# Patient Record
Sex: Female | Born: 1970
Health system: Southern US, Community
[De-identification: ages and names within clinical notes are randomized; demographics above are authoritative.]

## PROBLEM LIST (undated history)

## (undated) DIAGNOSIS — R7303 Prediabetes: Secondary | ICD-10-CM

## (undated) DIAGNOSIS — F329 Major depressive disorder, single episode, unspecified: Secondary | ICD-10-CM

## (undated) DIAGNOSIS — K5900 Constipation, unspecified: Secondary | ICD-10-CM

## (undated) DIAGNOSIS — R519 Headache, unspecified: Secondary | ICD-10-CM

## (undated) DIAGNOSIS — M549 Dorsalgia, unspecified: Secondary | ICD-10-CM

## (undated) DIAGNOSIS — F419 Anxiety disorder, unspecified: Secondary | ICD-10-CM

## (undated) DIAGNOSIS — R12 Heartburn: Secondary | ICD-10-CM

## (undated) DIAGNOSIS — M25471 Effusion, right ankle: Secondary | ICD-10-CM

## (undated) DIAGNOSIS — N159 Renal tubulo-interstitial disease, unspecified: Secondary | ICD-10-CM

## (undated) DIAGNOSIS — E559 Vitamin D deficiency, unspecified: Secondary | ICD-10-CM

## (undated) DIAGNOSIS — M2550 Pain in unspecified joint: Secondary | ICD-10-CM

## (undated) DIAGNOSIS — R0602 Shortness of breath: Secondary | ICD-10-CM

## (undated) DIAGNOSIS — T7840XA Allergy, unspecified, initial encounter: Secondary | ICD-10-CM

## (undated) DIAGNOSIS — M1990 Unspecified osteoarthritis, unspecified site: Secondary | ICD-10-CM

## (undated) DIAGNOSIS — R51 Headache: Secondary | ICD-10-CM

## (undated) DIAGNOSIS — R350 Frequency of micturition: Secondary | ICD-10-CM

## (undated) DIAGNOSIS — F32A Depression, unspecified: Secondary | ICD-10-CM

## (undated) DIAGNOSIS — C50411 Malignant neoplasm of upper-outer quadrant of right female breast: Secondary | ICD-10-CM

## (undated) DIAGNOSIS — M25472 Effusion, left ankle: Secondary | ICD-10-CM

## (undated) HISTORY — DX: Allergy, unspecified, initial encounter: T78.40XA

## (undated) HISTORY — DX: Renal tubulo-interstitial disease, unspecified: N15.9

## (undated) HISTORY — PX: OTHER SURGICAL HISTORY: SHX169

## (undated) HISTORY — DX: Depression, unspecified: F32.A

## (undated) HISTORY — PX: DENTAL SURGERY: SHX609

## (undated) HISTORY — DX: Dorsalgia, unspecified: M54.9

## (undated) HISTORY — DX: Pain in unspecified joint: M25.50

## (undated) HISTORY — DX: Vitamin D deficiency, unspecified: E55.9

## (undated) HISTORY — DX: Malignant neoplasm of upper-outer quadrant of right female breast: C50.411

## (undated) HISTORY — DX: Constipation, unspecified: K59.00

## (undated) HISTORY — DX: Heartburn: R12

## (undated) HISTORY — DX: Anxiety disorder, unspecified: F41.9

## (undated) HISTORY — DX: Effusion, left ankle: M25.472

## (undated) HISTORY — DX: Shortness of breath: R06.02

## (undated) HISTORY — DX: Effusion, right ankle: M25.471

## (undated) HISTORY — PX: DILATION AND CURETTAGE OF UTERUS: SHX78

## (undated) HISTORY — DX: Prediabetes: R73.03

## (undated) HISTORY — PX: BREAST LUMPECTOMY: SHX2

---
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
## 2016-09-06 ENCOUNTER — Other Ambulatory Visit: Payer: Self-pay | Admitting: Radiology

## 2016-09-10 ENCOUNTER — Encounter: Payer: Self-pay | Admitting: *Deleted

## 2016-09-10 ENCOUNTER — Telehealth: Payer: Self-pay | Admitting: *Deleted

## 2016-09-10 DIAGNOSIS — C50411 Malignant neoplasm of upper-outer quadrant of right female breast: Secondary | ICD-10-CM

## 2016-09-10 DIAGNOSIS — Z171 Estrogen receptor negative status [ER-]: Secondary | ICD-10-CM

## 2016-09-10 DIAGNOSIS — C50412 Malignant neoplasm of upper-outer quadrant of left female breast: Secondary | ICD-10-CM | POA: Insufficient documentation

## 2016-09-10 HISTORY — DX: Malignant neoplasm of upper-outer quadrant of left female breast: C50.412

## 2016-09-10 HISTORY — DX: Estrogen receptor negative status (ER-): Z17.1

## 2016-09-10 NOTE — Telephone Encounter (Signed)
Confirmed [facility] for 09/15/16 at 1215 .  Instructions and contact information given.

## 2016-09-14 NOTE — Progress Notes (Signed)
[place]  [phone] Fax:([phone]) [place] Note   Patient Care Team: Aloha Gell, MD as PCP - General (Obstetrics and Gynecology) Kyung Rudd, MD as Consulting Physician (Radiation Oncology) Truitt Merle, MD as Consulting Physician (Hematology) Rolm Bookbinder, MD as Consulting Physician (General Surgery) 09/15/2016  CHIEF COMPLAINTS/PURPOSE OF CONSULTATION:  Newly diagnosed left breast cancer  Oncology History   Malignant neoplasm of upper-outer quadrant of right female breast [facility])   Staging form: Breast, AJCC 7th Edition   - Clinical stage from 09/06/2016: Stage IIA (T1b, N1, M0) - Signed by Truitt Merle, MD on 09/15/2016      Breast cancer of upper-outer quadrant of left female breast ([place])   09/06/2016 Initial Diagnosis    Malignant neoplasm of upper-outer quadrant of right female breast ([place])      09/06/2016 Initial Biopsy    Left breast 3:00 position showed invasive ductal carcinoma and DCIS, left axillary lymph node biopsy showed metastatic carcinoma with extracapsular extension      09/06/2016 Receptors her2    Breast tumor ER 100% positive, PR 100% positive, HER-2 negative, Ki-67 5%      09/06/2016 Mammogram    Diagnostic mammogram and ultrasound showed a 1.0 cm mass at the 3 clock position of left breast, and the enlarged left axillary node with cortical thickening. The 27m cm oval cyst in the right breast is likely benign.       HISTORY OF PRESENTING ILLNESS:  SSonakshi Rolland45y.o. female is here because of her recently diagnosed left breast cancer. She presents to our multidisciplinary breast clinic with her husband.    She had screening mammogram on 09/01/2016 at her gynecologist office, which showed a bilateral breast nodules. Her previous mammogram was 4 years ago which was normal. She denies any significant symptoms, except occasional mild left breast shooting pain for the past few months, no palpable mass,  skin change or nipple discharge. She feels well overall. She gained about 20lbs in the past 6 months   [facility] which showed a benign 867mcyst in the right breast, and a 1 cm mass in the 3:00 position of left breast, and I enlarged left axillary node. Both the left breast mass and axillary node biopsy showed invasive ductal carcinoma, ER/PR strongly positive, HER-2 negative.  GYN HISTORY  Menarchal: 12 LMP: spotting 08/2016, she has IUD (L[place]acie AcresContraceptive:  HRT: n/a G4P1: she has 1938o son    MEDICAL HISTORY:  Past Medical History:  Diagnosis Date  . Malignant ne[place] of [place]outer quadrant of right [place]    SURGICAL HISTORY: Past Surgical History:  Procedure Laterality Date  . right knee surgery at age of 45     SOCIAL HISTORY: Social History   Social History  . Marital status: Unknown    Spouse name: N/A  . Number of children: N/A  . Years of education: N/A   Occupational History  . Not on file.   Social History Main Topics  . Smoking status: Former Smoker    Packs/day: 0.50    Years: 30.00    Quit date: 08/22/2016  . Smokeless tobacco: Never Used  . Alcohol use 0.6 oz/week    1 Glasses of wine per week     Comment: 6 drinks wine or beer a week   . Drug use: No  . Sexual activity: Not on file   Other Topics Concern  .  Not on file   Social History Narrative  . No narrative on file   She works as a Regulatory affairs officer, works from home   FAMILY HISTORY: Family History  Problem Relation Age of Onset  . Cancer Paternal Grandmother 76    colon cancer   . Cancer Cousin 40    breast cancer     ALLERGIES:  has No Known Allergies.  MEDICATIONS:  No current outpatient prescriptions on file.   No current facility-administered medications for this visit.     REVIEW OF SYSTEMS:   Constitutional: Denies fevers, chills or abnormal night sweats Eyes: Denies blurriness  of vision, double vision or watery eyes Ears, nose, mouth, throat, and face: Denies mucositis or sore throat Respiratory: Denies cough, dyspnea or wheezes Cardiovascular: Denies palpitation, chest discomfort or lower extremity swelling Gastrointestinal:  Denies nausea, heartburn or change in bowel habits Skin: Denies abnormal skin rashes Lymphatics: Denies new lymphadenopathy or easy bruising Neurological:Denies numbness, tingling or new weaknesses Behavioral/Psych: Mood is stable, no new changes  All other systems were reviewed with the patient and are negative.  PHYSICAL EXAMINATION: ECOG PERFORMANCE STATUS: 0 - Asymptomatic  Vitals:   09/15/16 1310  BP: 107/62  Pulse: 79  Resp: 18  Temp: 98 F (36.7 C)   Filed Weights   09/15/16 1310  Weight: 163 lb 8 oz (74.2 kg)    GENERAL:alert, no distress and comfortable SKIN: skin color, texture, turgor are normal, no rashes or significant lesions EYES: normal, conjunctiva are pink and non-injected, sclera clear OROPHARYNX:no exudate, no erythema and lips, buccal mucosa, and tongue normal  NECK: supple, thyroid normal size, non-tender, without nodularity LYMPH:  no palpable lymphadenopathy in the cervical, axillary or inguinal LUNGS: clear to auscultation and percussion with normal breathing effort HEART: regular rate & rhythm and no murmurs and no lower extremity edema ABDOMEN:abdomen soft, non-tender and normal bowel sounds Musculoskeletal:no cyanosis of digits and no clubbing  PSYCH: alert & oriented x 3 with fluent speech NEURO: no focal motor/sensory deficits Breasts: Breast inspection showed them to be symmetrical with no nipple discharge. Mild skin bruise in the upper outer quadrant of left breast. Palpation of the breasts and axilla revealed no obvious mass that I could appreciate except lumpy breast in the upper outer quadrant of left breast.   LABORATORY DATA:  I have reviewed the data as listed CBC Latest Ref Rng &  Units 09/15/2016  WBC 3.9 - 10.3 10e3/uL 14.7(H)  Hemoglobin 11.6 - 15.9 g/dL 13.8  Hematocrit 34.8 - 46.6 % 42.5  Platelets 145 - 400 10e3/uL 448(H)   CMP Latest Ref Rng & Units 09/15/2016  Glucose 70 - 140 mg/dl 126  BUN 7.0 - 26.0 mg/dL 14.9  Creatinine 0.6 - 1.1 mg/dL 0.8  Sodium 136 - 145 mEq/L 140  Potassium 3.5 - 5.1 mEq/L 4.2  CO2 22 - 29 mEq/L 26  Calcium 8.4 - 10.4 mg/dL 9.1  Total Protein 6.4 - 8.3 g/dL 7.3  Total Bilirubin 0.20 - 1.20 mg/dL 0.26  Alkaline Phos 40 - 150 U/L 122  AST 5 - 34 U/L 22  ALT 0 - 55 U/L 37     PATHOLOGY REPORT  Diagnosis 09/06/2016 1. Breast, left, needle core biopsy, 3 o'clock - INVASIVE DUCTAL CARCINOMA. - DUCTAL CARCINOMA IN SITU.  - SEE COMMENT. 2. Lymph node, axilla and upper limb, left - METASTATIC CARCINOMA IN 1 OF 1 LYMPH NODE (1/1), WITH EXTRACAPSULAR EXTENSION. Microscopic Comment 1. The carcinoma appears grade 2. A breast prognostic  profile will be performed and the results reported separately. The results were called to [facility] on 09/07/16. (JBK:gt, 09/07/16) 1. PROGNOSTIC INDICATORS Results: IMMUNOHISTOCHEMICAL AND MORPHOMETRIC ANALYSIS PERFORMED MANUALLY Estrogen Receptor: 100%, POSITIVE, STRONG STAINING INTENSITY Progesterone Receptor: 100%, POSITIVE, STRONG STAINING INTENSITY Proliferation Marker Ki67: 5%  Results: HER2 - NEGATIVE RATIO OF HER2/CEP17 SIGNALS 1.46 AVERAGE HER2 COPY NUMBER PER CELL 3.00   RADIOGRAPHIC STUDIES: I have personally reviewed the radiological images as listed and agreed with the findings in the report. No results found.  I have reviewed her outside mammogram and ultrasound results.  ASSESSMENT & PLAN:  45 year old pre-or perimenopausal woman, presented with screening discovered left breast mass and axilla adenopathy.  1. Breast cancer of upper-outer quadrant of left female breast, [phone] stage IIA, G2, ER+/PR+/HER2-, Ki67 5% ---We discussed her imaging  findings and the biopsy results in great details. -Giving the small primary breast tumor, she is likely a candidate for left breast lumpectomy. The option of sentinel lymph node biopsy versus as her lymph node dissection were discussed by breast surgeon Dr. Donne Hazel today.  -She has biopsy confirmed axillary lymph node metastasis, however the primary breast showed low Ki-67, possible indolent disease. -I have requested HER2 and Ki-67 to be done on her lymph node biopsy sample. -I recommend a mammoprint test on the biopsy sample to see if she has high-risk vs low risk disease. If she does have high risk disease, I would offer neoadjuvant or adjuvant chemotherapy. If she receives neoadjuvant chemotherapy and has good response, she can potentially avoid axillary lymph node dissection. Dr. Donne Hazel prefers her to have neoadjuvant chemotherapy if she has high-risk disease. Patient is agreeable. -Giving the strong ER and PR expression in her tumor and her pre or perimenopausal status, I recommend adjuvant endocrine therapy with tamoxifen for 10 years to reduce the risk of cancer recurrence. Potential benefits and side effects were discussed with patient and she is interested. The potential side effects, which includes but not limited to, hot flash, skin and vaginal dryness, slightly increased risk of cardiovascular disease and cataract, small risk of thrombosis and endometrial cancer, were discussed with her in great details. Preventive strategies for thrombosis, such as being physically active, using compression stocks, avoid cigarette smoking, etc., were reviewed with her. I also recommend her to follow-up with her gynecologist once a year, and watch for vaginal spotting or bleeding, as a clinically sign of endometrial cancer, etc. She voiced good understanding, and agrees to proceed.  -Since she is going to wait for the mammoprint result, I recommend her to start Tamoxifen now, she agrees, I will call in to her  pharmacy today.  -I recommend her to remove her progesterone containing IUD, she will call her gynecologist.  2. Genetics  -Giving her young age and positive family history of breast cancer, she'll be referred to genetic counselor for genetic testing, to ruled out inheritable breast cancer syndrome.  Plan -Mammaprint on her breast or node biopsy sample  -Dr. Lyndon Code will order HER2 and Ki67 on her node biopsy sample  -I will see her back in 3 weeks to discuss her mammaprint test results. -She will start tamoxifen in a few days. I called into her pharmacy today. -genetic referral    All questions were answered. The patient knows to call the clinic with any problems, questions or concerns. I spent 55 minutes counseling the patient face to face. The total time spent in the appointment was 60 minutes and more than 50%  was on counseling.     Truitt Merle, MD 09/15/2016

## 2016-09-15 ENCOUNTER — Ambulatory Visit (HOSPITAL_BASED_OUTPATIENT_CLINIC_OR_DEPARTMENT_OTHER): Payer: Managed Care, Other (non HMO) | Admitting: Hematology

## 2016-09-15 ENCOUNTER — Encounter: Payer: Self-pay | Admitting: Hematology

## 2016-09-15 ENCOUNTER — Ambulatory Visit: Payer: Managed Care, Other (non HMO) | Attending: General Surgery | Admitting: Physical Therapy

## 2016-09-15 ENCOUNTER — Other Ambulatory Visit (HOSPITAL_BASED_OUTPATIENT_CLINIC_OR_DEPARTMENT_OTHER): Payer: Managed Care, Other (non HMO)

## 2016-09-15 ENCOUNTER — Encounter: Payer: Self-pay | Admitting: Physical Therapy

## 2016-09-15 ENCOUNTER — Ambulatory Visit
Admission: RE | Admit: 2016-09-15 | Discharge: 2016-09-15 | Disposition: A | Discharge status: Home / Self Care | Payer: Managed Care, Other (non HMO) | Source: Ambulatory Visit | Attending: Radiation Oncology | Admitting: Radiation Oncology

## 2016-09-15 VITALS — BP 107/62 | HR 79 | Temp 98.0°F | Resp 18 | Ht 64.0 in | Wt 163.5 lb

## 2016-09-15 DIAGNOSIS — C773 Secondary and unspecified malignant neoplasm of axilla and upper limb lymph nodes: Secondary | ICD-10-CM | POA: Diagnosis not present

## 2016-09-15 DIAGNOSIS — Z170 Estrogen receptor positive status [ER+]: Secondary | ICD-10-CM | POA: Insufficient documentation

## 2016-09-15 DIAGNOSIS — C50412 Malignant neoplasm of upper-outer quadrant of left female breast: Secondary | ICD-10-CM | POA: Diagnosis not present

## 2016-09-15 DIAGNOSIS — Z510 Encounter for antineoplastic radiation therapy: Secondary | ICD-10-CM | POA: Insufficient documentation

## 2016-09-15 DIAGNOSIS — C50411 Malignant neoplasm of upper-outer quadrant of right female breast: Secondary | ICD-10-CM

## 2016-09-15 DIAGNOSIS — R293 Abnormal posture: Secondary | ICD-10-CM | POA: Insufficient documentation

## 2016-09-15 DIAGNOSIS — Z803 Family history of malignant neoplasm of breast: Secondary | ICD-10-CM | POA: Diagnosis not present

## 2016-09-15 LAB — CBC WITH DIFFERENTIAL/PLATELET
BASO%: 1.3 % (ref 0.0–2.0)
Basophils Absolute: 0.2 10*3/uL — ABNORMAL HIGH (ref 0.0–0.1)
EOS ABS: 0.1 10*3/uL (ref 0.0–0.5)
EOS%: 0.4 % (ref 0.0–7.0)
HCT: 42.5 % (ref 34.8–46.6)
HEMOGLOBIN: 13.8 g/dL (ref 11.6–15.9)
LYMPH#: 2.6 10*3/uL (ref 0.9–3.3)
LYMPH%: 17.7 % (ref 14.0–49.7)
MCH: 29.1 pg (ref 25.1–34.0)
MCHC: 32.5 g/dL (ref 31.5–36.0)
MCV: 89.5 fL (ref 79.5–101.0)
MONO#: 1 10*3/uL — AB (ref 0.1–0.9)
MONO%: 6.5 % (ref 0.0–14.0)
NEUT%: 74.1 % (ref 38.4–76.8)
NEUTROS ABS: 10.9 10*3/uL — AB (ref 1.5–6.5)
PLATELETS: 448 10*3/uL — AB (ref 145–400)
RBC: 4.75 10*6/uL (ref 3.70–5.45)
RDW: 14.3 % (ref 11.2–14.5)
WBC: 14.7 10*3/uL — AB (ref 3.9–10.3)

## 2016-09-15 LAB — COMPREHENSIVE METABOLIC PANEL
ALBUMIN: 3.7 g/dL (ref 3.5–5.0)
ALK PHOS: 122 U/L (ref 40–150)
ALT: 37 U/L (ref 0–55)
ANION GAP: 8 meq/L (ref 3–11)
AST: 22 U/L (ref 5–34)
BILIRUBIN TOTAL: 0.26 mg/dL (ref 0.20–1.20)
BUN: 14.9 mg/dL (ref 7.0–26.0)
CO2: 26 mEq/L (ref 22–29)
CREATININE: 0.8 mg/dL (ref 0.6–1.1)
Calcium: 9.1 mg/dL (ref 8.4–10.4)
Chloride: 106 mEq/L (ref 98–109)
EGFR: 87 mL/min/{1.73_m2} — AB (ref >90)
GLUCOSE: 126 mg/dL (ref 70–140)
Potassium: 4.2 mEq/L (ref 3.5–5.1)
Sodium: 140 mEq/L (ref 136–145)
TOTAL PROTEIN: 7.3 g/dL (ref 6.4–8.3)

## 2016-09-15 MED ORDER — TAMOXIFEN CITRATE 20 MG PO TABS: 20.0000 mg | ORAL_TABLET | Freq: Every day | ORAL | 1 refills | Status: DC

## 2016-09-15 NOTE — Therapy (Signed)
[place], [place], [address] Phone: [phone]   Fax:  [phone]  Physical Therapy Evaluation  Patient Details  Name: Gloria Smith MRN: [id number] Date of Birth: [birth year] Referring Provider: Dr. Rolm Bookbinder  Encounter Date: 09/15/2016      PT End of Session - 09/15/16 2138    Visit Number 1   Number of Visits 1   PT Start Time 6967   PT Stop Time 1525   PT Time Calculation (min) 31 min   Activity Tolerance Patient tolerated treatment well   Behavior During Therapy [facility] for tasks assessed/performed      Past Medical History:  Diagnosis Date  . Malignant neoplasm of upper-outer quadrant of right female breast ([place]) 09/10/2016    Past Surgical History:  Procedure Laterality Date  . right knee surgery at age of 77       There were no vitals filed for this visit.       Subjective Assessment - 09/15/16 2138    Subjective Patient reports she is here today to be seen by her medical team for her newly diagnosed left breast cancer.   Patient is accompained by: Family member   Pertinent History Patient was diagnosed on 09/01/16 with left invasive breast cancer.  It is ER/PR positive, HER2 negative, and has a Ki67 of 5%. It measures 1 cm and is located in the upper outer quadrant. She has a biopsied positive axillary lymph node.   Patient Stated Goals Reduce lymphedema risk and learn post op shoulder ROM HEP            [facility] PT Assessment - 09/15/16 0001      Assessment   Medical Diagnosis Left breast cancer   Referring Provider Dr. Rolm Bookbinder   Onset Date/Surgical Date 09/01/16   Hand Dominance Right   Prior Therapy none     Precautions   Precautions Other (comment)   Precaution Comments Active cancer     Restrictions   Weight Bearing Restrictions No     Balance Screen   Has the patient fallen in the past 6 months No   Has the patient had a decrease in activity  level because of a fear of falling?  No   Is the patient reluctant to leave their home because of a fear of falling?  No     Home Ecologist residence   Living Arrangements Spouse/significant other  Lives with her boyfriend   Available Help at Discharge Family     Prior Function   Level of Independence Independent   Vocation Full time employment   Vocation Requirements Desk work   Leisure She does not exercise     Cognition   Overall Cognitive Status Within Functional Limits for tasks assessed     Posture/Postural Control   Posture/Postural Control Postural limitations   Postural Limitations Rounded Shoulders;Forward head     ROM / Strength   AROM / PROM / Strength AROM;Strength     AROM   AROM Assessment Site Shoulder;Cervical   Right/Left Shoulder Right;Left   Right Shoulder Extension 45 Degrees   Right Shoulder Flexion 145 Degrees   Right Shoulder ABduction 169 Degrees   Right Shoulder Internal Rotation 78 Degrees   Right Shoulder External Rotation 88 Degrees   Left Shoulder Extension 54 Degrees   Left Shoulder Flexion 153 Degrees   Left Shoulder ABduction 168 Degrees   Left Shoulder Internal Rotation 78 Degrees  Left Shoulder External Rotation 80 Degrees   Cervical Flexion WNL   Cervical Extension WNL   Cervical - Right Side Bend WNL   Cervical - Left Side Bend WNL   Cervical - Right Rotation WNL   Cervical - Left Rotation WNL     Strength   Overall Strength Within functional limits for tasks performed           LYMPHEDEMA/ONCOLOGY QUESTIONNAIRE - 09/15/16 2133      Type   Cancer Type Left breast cancer     Lymphedema Assessments   Lymphedema Assessments Upper extremities     Right Upper Extremity Lymphedema   10 cm Proximal to Olecranon Process 28.8 cm   Olecranon Process 24.3 cm   10 cm Proximal to Ulnar Styloid Process 24 cm   Just Proximal to Ulnar Styloid Process 16.5 cm   Across Hand at [employer] 18.6 cm    At [place] of 2nd Digit 6.9 cm     Left Upper Extremity Lymphedema   10 cm Proximal to Olecranon Process 29.8 cm   Olecranon Process 24 cm   10 cm Proximal to Ulnar Styloid Process 23.2 cm   Just Proximal to Ulnar Styloid Process 16.1 cm   Across Hand at [employer] 18.3 cm   At [place] of 2nd Digit 6.7 cm      Patient was instructed today in a home exercise program today for post op shoulder range of motion. These included active assist shoulder flexion in sitting, scapular retraction, wall walking with shoulder abduction, and hands behind head external rotation.  She was encouraged to do these twice a day, holding 3 seconds and repeating 5 times when permitted by her physician.         PT Education - 09/15/16 2137    Education provided Yes   Education Details Post op shoulder ROM HEP and lymphedema risk reduction   Person(s) Educated Patient   Methods Explanation;Demonstration;Handout   Comprehension Returned demonstration;Verbalized understanding              Breast Clinic Goals - 09/15/16 2143      Patient will be able to verbalize understanding of pertinent lymphedema risk reduction practices relevant to her diagnosis specifically related to skin care.   Time 1   Period Days   Status Achieved     Patient will be able to return demonstrate and/or verbalize understanding of the post-op home exercise program related to regaining shoulder range of motion.   Time 1   Period Days   Status Achieved     Patient will be able to verbalize understanding of the importance of attending the postoperative After Breast Cancer Class for further lymphedema risk reduction education and therapeutic exercise.   Time 1   Period Days   Status Achieved              Plan - 09/15/16 2138    Clinical Impression Statement Patient was diagnosed on 09/01/16 with left invasive breast cancer.  It is ER/PR positive, HER2 negative, and has a Ki67 of 5%. It measures 1 cm and is located  in the upper outer quadrant. She has a biopsied positive axillary lymph node. Her multidisciplinary medical team met prior to her assessments to determine a recommended treatment plan. She is planning to have Mammaprint testing on her core biopsy.  If those results indicate a need for chemotherapy, she will consider neoadjuvant chemotherapy. She began Tamoxifen today.  After chemo (if she needs chemo),  she will likely undergo a left lumpectomy and targeted axillary node dissection followed by radiation. She may benefit from post op PT to regain shoulder ROM and reduce lymphedema risk.  Due to her lack of comorbidities, her eval is of low complexity.   Rehab Potential Excellent   Clinical Impairments Affecting Rehab Potential None   PT Frequency One time visit   PT Treatment/Interventions Patient/family education;Therapeutic exercise   PT Next Visit Plan Will f/u after surgery to determine PT needs   PT Home Exercise Plan Post op shoulder ROM HEP and lymphedema risk reduction   Consulted and Agree with Plan of Care Patient;Family member/caregiver   Family Member Consulted Boyfriend      Patient will benefit from skilled therapeutic intervention in order to improve the following deficits and impairments:  Postural dysfunction, Decreased knowledge of precautions, Pain, Impaired UE functional use, Decreased range of motion  Visit Diagnosis: Carcinoma of upper-outer quadrant of left breast in female, estrogen receptor positive ([place]) - Plan: PT plan of care cert/re-cert  Abnormal posture - Plan: PT plan of care cert/re-cert   Patient will follow up at outpatient cancer rehab if needed following surgery.  If the patient requires physical therapy at that time, a specific plan will be dictated and sent to the referring physician for approval. The patient was educated today on appropriate basic range of motion exercises to begin post operatively and the importance of attending the After Breast Cancer  class following surgery.  Patient was educated today on lymphedema risk reduction practices as it pertains to recommendations that will benefit the patient immediately following surgery.  She verbalized good understanding.  No additional physical therapy is indicated at this time.     Problem List Patient Active Problem List   Diagnosis Date Noted  . Breast cancer of upper-outer quadrant of left female breast ([place]) 09/10/2016   Annia Friendly, PT 09/15/16 9:45 PM   [place], [place], [address] Phone: [phone]   Fax:  [phone]  Name: Gloria Smith MRN: [id number] Date of Birth: December 12, 1970

## 2016-09-15 NOTE — Patient Instructions (Signed)

## 2016-09-15 NOTE — Progress Notes (Signed)
ON[facility]N DISTRESS SCREENING 09/15/2016  Screening Type Initial Screening  Distress experienced in past week (1-10) 6  Practical problem type Insurance;Work/school  Family Problem type Partner;Children;Other (comment)  Emotional problem type Nervousness/Anxiety;Adjusting to illness  Information Concerns Type Lack of info about diagnosis;Lack of info about treatment  Physical Problem type Pain;Sleep/insomnia;Constipation/diarrhea;Tingling hands/feet  Referral to support programs Yes    Met with patient in Breast Multidisciplinary Clinic to introduce [place] team/resources, reviewing distress screen per protocol.  The patient scored a 6 on the Psychosocial Distress Thermometer which indicates moderate distress. Also assessed for distress and other psychosocial needs.  Patient shared how helpful it was to receive more information regarding her diagnosis and treatment plan. Pt said that she takes comfort in having a plan and that her anxiety had gone down throughout clinic. Patient's family lives in [place] but shared that she has a very supportive community, especially at work. Patient's boyfriend agreed that having more information has eased his worry. Patient appeared interested in the support center services and said she would keep them in mind as treatment progressed.  Rosana Fret, Counseling Intern  Supervisor, Lorrin Jackson, Chaplain

## 2016-09-15 NOTE — Progress Notes (Signed)
Radiation Oncology         [phone] ________________________________  Name: Gloria Smith MRN: [id number]  Date: 09/15/2016  DOB: 1971-03-15  PI:RJJOACZY,SAYTK A., MD  Aloha Gell, MD     REFERRING PHYSICIAN: Aloha Gell, MD   DIAGNOSIS: The encounter diagnosis was Malignant neoplasm of upper-outer quadrant of right female breast, unspecified estrogen receptor status ([place]).   HISTORY OF PRESENT ILLNESS: Gloria Smith is a 45 y.o. female seen in the multidisciplinary breast clinic for a new diagnosis of right breast cancer. She was seen for a screening mammogram which revealed a cystic change in the right breast and a 1 cm mass in the left breast. Diagnostic imaging including ultrasound confirmed a 1 cm mass in the left breast and one area of adenopathy in the left axilla. She underwent biopsy on 09/06/16 which revealed a grade 2 invasive ductal carcinoma. The left axillary node seen on ultrasound was also biopsied and confirmed invasive ductal carcinoma with extracapsular extension. Hormone receptors were notable for ER/PR positivity, and HER2 negative, Ki67 was 5% on the breast specimen. The lymph node did not have hormone receptors tested separately and will be requested. She comes to discuss the options for treatment of her cancer.    PREVIOUS RADIATION THERAPY: No   PAST MEDICAL HISTORY:  Past Medical History:  Diagnosis Date  . Malignant neoplasm of upper-outer quadrant of right female breast ([place]) 09/10/2016       PAST SURGICAL HISTORY: Past Surgical History:  Procedure Laterality Date  . right knee surgery at age of 69        FAMILY HISTORY:  Family History  Problem Relation Age of Onset  . Cancer Paternal Grandmother 17    colon cancer   . Cancer Cousin 40    breast cancer      SOCIAL HISTORY:  reports that she quit smoking about 3 weeks ago. She has a 15.00 pack-year smoking history. She does not have any smokeless tobacco history on file. She  reports that she drinks about 0.6 oz of alcohol per week . She reports that she does not use drugs. The patient is in a relationship and is originally from [place]. 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   ALLERGIES: Review of patient's allergies indicates no known allergies.   MEDICATIONS:  No current outpatient prescriptions on file.   No current facility-administered medications for this encounter.      REVIEW OF SYSTEMS: On review of systems, the patient reports that she is doing well overall. She denies any chest pain, shortness of breath, cough, fevers, chills, night sweats, unintended weight changes. She denies any bowel or bladder disturbances, and denies abdominal pain, nausea or vomiting. She denies any new musculoskeletal or joint aches or pains. A complete review of systems is obtained and is otherwise negative.     PHYSICAL EXAM:  Wt Readings from Last 3 Encounters:  09/15/16 163 lb 8 oz (74.2 kg)   Temp Readings from Last 3 Encounters:  09/15/16 98 F (36.7 C) (Oral)   BP Readings from Last 3 Encounters:  09/15/16 107/62   Pulse Readings from Last 3 Encounters:  09/15/16 79     Pain scale 0/10 In general this is a well appearing caucasian female in no acute distress. She is alert and oriented x4 and appropriate throughout the examination. HEENT reveals that the patient is normocephalic, atraumatic. EOMs are intact. PERRLA. Skin is intact without any evidence of gross lesions. Cardiovascular exam reveals a regular  rate and rhythm, no clicks rubs or murmurs are auscultated. Chest is clear to auscultation bilaterally. Lymphatic assessment is performed and does not reveal any adenopathy in the cervical, supraclavicular, axillary, or inguinal chains. Bilateral breast exam is performed and reveals no distinct mass of the right breast. The left breast reveals post biopsy changes including induration beneath the biopsy site. There is also subtle fullness at this  site consistent with her tumor. Abdomen has active bowel sounds in all quadrants and is intact. The abdomen is soft, non tender, non distended. Lower extremities are negative for pretibial pitting edema, deep calf tenderness, cyanosis or clubbing.   ECOG = 0  0 - Asymptomatic (Fully active, able to carry on all predisease activities without restriction)  1 - Symptomatic but completely ambulatory (Restricted in physically strenuous activity but ambulatory and able to carry out work of a light or sedentary nature. For example, light housework, office work)  2 - Symptomatic, <50% in bed during the day (Ambulatory and capable of all self care but unable to carry out any work activities. Up and about more than 50% of waking hours)  3 - Symptomatic, >50% in bed, but not bedbound (Capable of only limited self-care, confined to bed or chair 50% or more of waking hours)  4 - Bedbound (Completely disabled. Cannot carry on any self-care. Totally confined to bed or chair)  5 - Death   Eustace Pen MM, Creech RH, Tormey DC, et al. [phone]). "Toxicity and response criteria of the [facility]". [place] Oncol. 5 (6): 649-55    LABORATORY DATA:  Lab Results  Component Value Date   WBC 14.7 (H) 09/15/2016   HGB 13.8 09/15/2016   HCT 42.5 09/15/2016   MCV 89.5 09/15/2016   PLT 448 (H) 09/15/2016   Lab Results  Component Value Date   NA 140 09/15/2016   K 4.2 09/15/2016   CO2 26 09/15/2016   Lab Results  Component Value Date   ALT 37 09/15/2016   AST 22 09/15/2016   ALKPHOS 122 09/15/2016   BILITOT 0.26 09/15/2016      RADIOGRAPHY: No results found.     IMPRESSION/PLAN: 1. Stage IIA, cT1bN1Mx, ER/PR positive invasive ductal carcinoma of the left breast. Dr. Lisbeth Renshaw discusses the pathology findings and reviews the nature of invasive breast disease. The consensus from the breast conference include submitting her nodal specimen for hormone receptor and Ki67 testing. Based on  this and mammaprint that will be ordered on the specimen, this will determine a role for neoadjuvant chemotherapy. Following this, breast conservation with lumpectomy with sentinel mapping. The patient's course would then be followed by external radiotherapy to the breast followed by antiestrogen therapy. We discussed the risks, benefits, short, and long term effects of radiotherapy, and the patient is interested in proceeding. Dr. Lisbeth Renshaw discusses the delivery and logistics of radiotherapy. We will see her back about 2-3 weeks after surgery to move forward with the simulation and planning process and anticipate starting radiotherapy about 4 weeks after surgery.  2. Possible genetic predisposition to malignancy. Given the patient's family and personal history, she is a candidate to meet and proceed with genetic testing. A referral for this will be placed through the clinic.    The above documentation reflects my direct findings during this shared patient visit. Please see the separate note by Dr. Lisbeth Renshaw on this date for the remainder of the patient's plan of care.    Carola Rhine, PAC

## 2016-09-16 ENCOUNTER — Telehealth: Payer: Self-pay | Admitting: *Deleted

## 2016-09-16 ENCOUNTER — Ambulatory Visit (HOSPITAL_BASED_OUTPATIENT_CLINIC_OR_DEPARTMENT_OTHER): Payer: Managed Care, Other (non HMO) | Admitting: Genetic Counselor

## 2016-09-16 ENCOUNTER — Other Ambulatory Visit: Payer: Managed Care, Other (non HMO)

## 2016-09-16 ENCOUNTER — Encounter: Payer: Self-pay | Admitting: Genetic Counselor

## 2016-09-16 DIAGNOSIS — Z803 Family history of malignant neoplasm of breast: Secondary | ICD-10-CM | POA: Diagnosis not present

## 2016-09-16 DIAGNOSIS — C50412 Malignant neoplasm of upper-outer quadrant of left female breast: Secondary | ICD-10-CM

## 2016-09-16 DIAGNOSIS — Z170 Estrogen receptor positive status [ER+]: Principal | ICD-10-CM

## 2016-09-16 DIAGNOSIS — Z800 Family history of malignant neoplasm of digestive organs: Secondary | ICD-10-CM

## 2016-09-16 DIAGNOSIS — Z315 Encounter for genetic counseling: Secondary | ICD-10-CM

## 2016-09-16 NOTE — Progress Notes (Signed)
REFERRING PROVIDER: Truitt Merle, MD  OTHER PROVIDER(S): WAKEFIELD, MATTHEW, MD Kyung Rudd, MD  PRIMARY PROVIDER:  Ala Dach., MD  PRIMARY REASON FOR VISIT:  1. Malignant neoplasm of upper-outer quadrant of left breast in female, estrogen receptor positive ([place])   2. Family history of breast cancer in female   3. Family history of colon cancer      HISTORY OF PRESENT ILLNESS:   Ms. Kuipers, a 45 y.o. female, was seen for a [facility] cancer genetics consultation at the request of Dr. Burr Medico due to a personal and family history of breast cancer.  Ms. Mcquilkin presents to clinic today to discuss the possibility of a hereditary predisposition to cancer, genetic testing, and to further clarify her future cancer risks, as well as potential cancer risks for family members.   In October 2017, at the age of 52, Ms. Cuffie was diagnosed with invasive ductal carcinoma with DCIS of the left breast.  Hormone receptor status was ER/PR+, Her2-. Genetic testing will help inform Ms. Granier's surgical/treatment decisions.  Ms. Heron reports no additional personal history of cancer.   CANCER HISTORY:  Oncology History   Malignant neoplasm of upper-outer quadrant of right female breast [facility])   Staging form: Breast, AJCC 7th Edition   - Clinical stage from 09/06/2016: Stage IIA (T1b, N1, M0) - Signed by Truitt Merle, MD on 09/15/2016      Breast cancer of upper-outer quadrant of left female breast ([place])   09/06/2016 Initial Diagnosis    Malignant neoplasm of upper-outer quadrant of right female breast ([place])      09/06/2016 Initial Biopsy    Left breast 3:00 position showed invasive ductal carcinoma and DCIS, left axillary lymph node biopsy showed metastatic carcinoma with extracapsular extension      09/06/2016 Receptors her2    Breast tumor ER 100% positive, PR 100% positive, HER-2 negative, Ki-67 5%      09/06/2016 Mammogram    Diagnostic mammogram and ultrasound showed a 1.0  cm mass at the 3 clock position of left breast, and the enlarged left axillary node with cortical thickening. The 82m cm oval cyst in the right breast is likely benign.        HORMONAL RISK FACTORS:  Menarche was at age [age]  First live birth at age 89  OCP use for approximately 10+ years (pills, hormonal IUD, depo provera).  Ovaries intact: yes.  Hysterectomy: no.  Menopausal status: perimenopausal.  HRT use: 0 years. Colonoscopy: no; not examined. Mammogram within the last year: prior to screening with this cancer, she had not had a mammogram since 2013. Number of breast biopsies: 1. Up to date with pelvic exams:  yes. Any excessive radiation exposure/other exposures in the past:  no  Past Medical History:  Diagnosis Date  . Malignant neoplasm of upper-outer quadrant of right female breast ([place] 09/10/2016    Past Surgical History:  Procedure Laterality Date  . right knee surgery at age of 89      Social History   Social History  . Marital status: Unknown    Spouse name: N/A  . Number of children: N/A  . Years of education: N/A   Social History Main Topics  . Smoking status: Former Smoker    Packs/day: 0.50    Years: 30.00    Quit date: 08/22/2016  . Smokeless tobacco: Never Used  . Alcohol use 0.6 oz/week    1 Glasses of wine per week     Comment: 6 drinks wine  or beer a week   . Drug use: No  . Sexual activity: Not on file   Other Topics Concern  . Not on file   Social History Narrative  . No narrative on file     FAMILY HISTORY:  We obtained a detailed, 4-generation family history.  Significant diagnoses are listed below: Family History  Problem Relation Age of Onset  . Colon cancer Paternal Grandmother 95    d. 4  . Breast cancer Cousin     paternal 1st cousin, once-removed; dx. 3s  . Other Mother     hx precancerous skin finding removed from eyelid at 70-71  . Heart attack Maternal Grandmother 70  . Stroke Maternal Grandfather   . Heart  attack Paternal Grandfather 75  . Other Other 25    hx of precancerous finding on abnormal pap smear    Ms. Trunnell has one son who is 43.  She has one full sister who is currently 50 and who has never had cancer.  Her sister has two daughters, the oldest of which has had a recent abnormal pap smear that demonstrated pre-cancerous cells.  Ms. Sarkisyan mother is currently 14 and has not had cancer.  She does report that she had a pre-cancerous cells removed from her eyelid within the last year.  Ms. Scotti father is currently 87 and has not had cancer.  Ms. Verne mother has two full brothers, currently in their mid-70s-80s and cancer-free.  Ms. Heathcock has two maternal first cousins, for whom she reports no history of cancer.  Her maternal grandmother died of a heart attack at 17.  Her grandfather had a history of strokes and died at 65.  She has no information for her maternal great aunts/uncles and great grandparents.  Ms. Oswald father has one full sister and two full brothers.  His sister died of alcohol abuse in her 12s.  One brother died "in his sleep", possibly from heart-related causes at age 41-62.  One brother is currently 31 and has not had cancer.  Ms. Railey reports no cancer history for her paternal first cousins.  Her paternal grandmother was diagnosed with colon cancer at 70 and passed away 6 months later.  This grandmother was one of 13 siblings, but Ms. Argueta has limited information for these great aunts/uncles.  She does report that one great uncle had a daughter (a paternal first cousins, one-removed) who was diagnosed with breast cancer in her 68s.  Ms. Depasquale paternal grandfather died of a heart attack at 33.  She has no information for his siblings or parents.    Ms. Carignan is unaware of any previous family history of genetic testing for hereditary cancer risks.  Patient's maternal and paternal ancestors are of English descent. There is no  reported Ashkenazi Jewish ancestry. There is no known consanguinity.  GENETIC COUNSELING ASSESSMENT: Haili Donofrio is a 44 y.o. female with a personal and distant family history of breast cancer which is somewhat suggestive of a hereditary breast cancer syndrome and predisposition to cancer. We, therefore, discussed and recommended the following at today's visit.   DISCUSSION: We reviewed the characteristics, features and inheritance patterns of hereditary cancer syndromes, particularly those caused by mutations within the BRCA1/2 genes. We also discussed genetic testing, including the appropriate family members to test, the process of testing, insurance coverage and turn-around-time for results. We discussed the implications of a negative, positive and/or variant of uncertain significant result. We discussed that Ms. Wegner just misses her insurance  criteria for genetic testing, as she was diagnosed at 1 and the only known relative with breast cancer in her family is a fourth-degree relatives.  Still yet, she was diagnosed with breast cancer at a young age, has a somewhat smaller immediate family and has little information for her great aunts and great uncles.  We discussed self-pay testing versus insurance coverage.  Ms. Jobst is very interested in genetic testing, as it will help her make surgical decisions, so she would like to proceed with testing and she would like to pursue insurance coverage.   Thus, we recommended Ms. Sipe pursue genetic testing for the 20-gene Breast/Ovarian Cancer Panel through [facility].  The Breast/Ovarian Cancer Panel offered by [facility] Hope Pigeon, MD) includes sequencing and deletion/duplication analysis for the following 19 genes:  ATM, BARD1, BRCA1, BRCA2, BRIP1, CDH1, CHEK2, FANCC, MLH1, MSH2, MSH6, NBN, PALB2, PMS2, PTEN, RAD51C, RAD51D, TP53, and XRCC2.  This panel also includes deletion/duplication analysis (without sequencing) for  one gene, EPCAM.   [facility] will proceed with a billing investigation to determine what her out-of-pocket cost will be. We discussed that if her out of pocket cost for testing is over $100, the laboratory will call and confirm whether she wants to proceed with testing.  If the out of pocket cost of testing is less than $100 she will be billed by the genetic testing laboratory.  If she has an expensive out-of-pocket cost for genetic testing, we can cancel testing and order self-pay genetic testing through a different laboratory.  PLAN: After considering the risks, benefits, and limitations, Ms. Matzen  provided informed consent to pursue genetic testing and the blood sample was sent to [facility] for analysis of the 20-gene Breast/Ovarian Cancer Panel. Results should be available within approximately 2-3 weeks' time, at which point they will be disclosed by telephone to Ms. Marut, as will any additional recommendations warranted by these results. Ms. Hagey will receive a summary of her genetic counseling visit and a copy of her results once available. This information will also be available in Epic. We encouraged Ms. Gadd to remain in contact with cancer genetics annually so that we can continuously update the family history and inform her of any changes in cancer genetics and testing that may be of benefit for her family. Ms. Mossberg questions were answered to her satisfaction today. Our contact information was provided should additional questions or concerns arise.  Thank you for the referral and allowing [place] to share in the care of your patient.   Jeanine Luz, MS, [facility] Certified Genetic Counselor [email or web address] Phone: [phone]  The patient was seen for a total of 60 minutes in face-to-face genetic counseling.  This patient was discussed with Drs. Magrinat, Lindi Adie and/or Burr Medico who agrees with the above.     _______________________________________________________________________ For Office Staff:  Number of people involved in session: 1 Was an Intern/ student involved with case: yes

## 2016-09-16 NOTE — Telephone Encounter (Signed)
Ordered mammaprint on core bx per Dr. Burr Medico.  Faxed requisition to pathology and confirmed receipt with [facility].

## 2016-09-17 ENCOUNTER — Encounter: Payer: Self-pay | Admitting: Genetic Counselor

## 2016-09-17 ENCOUNTER — Telehealth: Payer: Self-pay | Admitting: Genetic Counselor

## 2016-09-17 NOTE — Telephone Encounter (Signed)
Gloria Smith called to let me know that she had some updates to her family history of cancer.  I let her know that I would make these changes in her chart.  These updates will not change our current genetic testing order.

## 2016-09-20 ENCOUNTER — Telehealth: Payer: Self-pay | Admitting: Hematology

## 2016-09-20 NOTE — Telephone Encounter (Signed)
Spoke with patient re 11/21 f/u.

## 2016-09-21 ENCOUNTER — Telehealth: Payer: Self-pay | Admitting: *Deleted

## 2016-09-21 NOTE — Telephone Encounter (Signed)
  Oncology Nurse Navigator Documentation  Navigator Location: [facility] (09/21/16 1300)   )Navigator Encounter Type: Telephone (09/21/16 1300) Telephone: Outgoing Call;Clinic/MDC Follow-up (09/21/16 1300)                                                  Time Spent with Patient: 15 (09/21/16 1300)

## 2016-09-22 ENCOUNTER — Telehealth: Payer: Self-pay | Admitting: *Deleted

## 2016-09-22 ENCOUNTER — Other Ambulatory Visit: Payer: Self-pay | Admitting: General Surgery

## 2016-09-22 DIAGNOSIS — C50412 Malignant neoplasm of upper-outer quadrant of left female breast: Secondary | ICD-10-CM

## 2016-09-22 NOTE — Telephone Encounter (Signed)
Spoke with patient to give her mammaprint results of low risk.  No chemo per Dr. Burr Medico.  We are still waiting on genetics although she doesn't think that it will change her mind.

## 2016-09-22 NOTE — Telephone Encounter (Signed)
Received Mammaprint results of Low Risk.  Placed a copy in Dr. Ernestina Penna box, gave a copy to Cliff & took a copy to HIM to scan.

## 2016-09-23 NOTE — Telephone Encounter (Signed)
  Oncology Nurse Navigator Documentation      )                                                           

## 2016-09-24 ENCOUNTER — Telehealth: Payer: Self-pay | Admitting: Genetic Counselor

## 2016-09-24 ENCOUNTER — Encounter (HOSPITAL_COMMUNITY): Payer: Self-pay

## 2016-09-24 NOTE — Telephone Encounter (Signed)
Discussed with Ms. Vandevender that her OOP cost for genetic testing is estimated at $2,109.90.  I let her now that she can provide household number and annual household income and the lab may be able to significantly reduce cost based on that information.  She was hesitant to provide this information and wanted to speak with insurance.  Let her know that the lab is handling insurance benefits, but that potentially they could reduce cost with this info.  Discussed additional option of testing through lab that is out-of-network with her insurance.  They have assured me her cost would be $100, but we would have to get [facility] to send sample to them and this could take additional time, while her surgery is on 11/14.  Also let her know that additional information she provided about family cancer history is not changing her cost.  She is willing to call [facility], so I provided her with the main phone number, so she can discuss further with the lab.  Advised her to let me know what she decides to proceed with.

## 2016-09-24 NOTE — Telephone Encounter (Signed)
Gloria Smith followed up with the lab and let them know that her deductible has been met.  As they were not aware, this will likely greatly affect her cost of testing.  Thus, testing is proceeding as normal.  She would like me to call and let the lab know to process testing on a rush, and I will do that.

## 2016-09-24 NOTE — Telephone Encounter (Signed)
Called [facility] to make sure they are processing testing STAT.  They are aware that Ms. Reason's surgery is on 11/14.

## 2016-10-01 ENCOUNTER — Telehealth: Payer: Self-pay | Admitting: Genetic Counselor

## 2016-10-01 ENCOUNTER — Encounter (HOSPITAL_COMMUNITY)
Admission: RE | Admit: 2016-10-01 | Discharge: 2016-10-01 | Disposition: A | Discharge status: Home / Self Care | Payer: Managed Care, Other (non HMO) | Source: Ambulatory Visit | Attending: General Surgery | Admitting: General Surgery

## 2016-10-01 ENCOUNTER — Encounter (HOSPITAL_COMMUNITY): Payer: Self-pay | Admitting: General Practice

## 2016-10-01 DIAGNOSIS — Z01812 Encounter for preprocedural laboratory examination: Secondary | ICD-10-CM | POA: Insufficient documentation

## 2016-10-01 DIAGNOSIS — C50412 Malignant neoplasm of upper-outer quadrant of left female breast: Secondary | ICD-10-CM | POA: Insufficient documentation

## 2016-10-01 HISTORY — DX: Headache: R51

## 2016-10-01 HISTORY — DX: Frequency of micturition: R35.0

## 2016-10-01 HISTORY — DX: Headache, unspecified: R51.9

## 2016-10-01 LAB — BASIC METABOLIC PANEL
ANION GAP: 6 (ref 5–15)
BUN: 14 mg/dL (ref 6–20)
CHLORIDE: 106 mmol/L (ref 101–111)
CO2: 27 mmol/L (ref 22–32)
Calcium: 9.2 mg/dL (ref 8.9–10.3)
Creatinine, Ser: 0.93 mg/dL (ref 0.44–1.00)
GFR calc non Af Amer: >60 mL/min (ref >60)
GLUCOSE: 92 mg/dL (ref 65–99)
Potassium: 4.2 mmol/L (ref 3.5–5.1)
Sodium: 139 mmol/L (ref 135–145)

## 2016-10-01 LAB — CBC
HEMATOCRIT: 41.7 % (ref 36.0–46.0)
HEMOGLOBIN: 13.5 g/dL (ref 12.0–15.0)
MCH: 29.1 pg (ref 26.0–34.0)
MCHC: 32.4 g/dL (ref 30.0–36.0)
MCV: 89.9 fL (ref 78.0–100.0)
Platelets: 464 10*3/uL — ABNORMAL HIGH (ref 150–400)
RBC: 4.64 MIL/uL (ref 3.87–5.11)
RDW: 13.8 % (ref 11.5–15.5)
WBC: 10.8 10*3/uL — AB (ref 4.0–10.5)

## 2016-10-01 LAB — HCG, SERUM, QUALITATIVE: Preg, Serum: NEGATIVE

## 2016-10-01 NOTE — Progress Notes (Signed)
PCP - Dr. Aloha Gell Cardiologist - denies  EKG/CXR - denies Echo/stress test/cardiac cath - denies  Patient denies chest pain and shortness of breath at PAT appointment.

## 2016-10-01 NOTE — Pre-Procedure Instructions (Addendum)
Gloria Smith  10/01/2016      [facility] - [place], [place] - [address] [place] [address] Phone: [phone] Fax: [phone]    Your procedure is scheduled on November 14  Report to [facility] Admitting at 6:00 A.M.  Call this number if you have problems the morning of surgery:  [phone]   Remember:  Do not eat food or drink liquids after midnight.   Take these medicines the morning of surgery with A SIP OF WATER: None.   Please drink the Boost Breeze that you were given the morning of surgery 2 hours before 6:00 am.  Drink at 4 am please  7 days prior to surgery STOP taking any Aspirin, Aleve, Naproxen, Ibuprofen, Motrin, Advil, Goody's, BC's, all herbal medications, fish oil, and all vitamins    Do not wear jewelry, make-up or nail polish.  Do not wear lotions, powders, or perfumes, or deoderant.  Do not shave 48 hours prior to surgery.    Do not bring valuables to the hospital.  [facility] is not responsible for any belongings or valuables.  Contacts, dentures or bridgework may not be worn into surgery.  Leave your suitcase in the car.  After surgery it may be brought to your room.  For patients admitted to the hospital, discharge time will be determined by your treatment team.  Patients discharged the day of surgery will not be allowed to drive home.    Special instructions:   [facility]- Preparing For Surgery  Before surgery, you can play an important role. Because skin is not sterile, your skin needs to be as free of germs as possible. You can reduce the number of germs on your skin by washing with CHG (chlorahexidine gluconate) Soap before surgery.  CHG is an antiseptic cleaner which kills germs and bonds with the skin to continue killing germs even after washing.  Please do not use if you have an allergy to CHG or antibacterial soaps. If your skin becomes  reddened/irritated stop using the CHG.  Do not shave (including legs and underarms) for at least 48 hours prior to first CHG shower. It is OK to shave your face.  Please follow these instructions carefully.   1. Shower the NIGHT BEFORE SURGERY and the MORNING OF SURGERY with CHG.   2. If you chose to wash your hair, wash your hair first as usual with your normal shampoo.  3. After you shampoo, rinse your hair and body thoroughly to remove the shampoo.  4. Use CHG as you would any other liquid soap. You can apply CHG directly to the skin and wash gently with a scrungie or a clean washcloth.   5. Apply the CHG Soap to your body ONLY FROM THE NECK DOWN.  Do not use on open wounds or open sores. Avoid contact with your eyes, ears, mouth and genitals (private parts). Wash genitals (private parts) with your normal soap.  6. Wash thoroughly, paying special attention to the area where your surgery will be performed.  7. Thoroughly rinse your body with warm water from the neck down.  8. DO NOT shower/wash with your normal soap after using and rinsing off the CHG Soap.  9. Pat yourself dry with a CLEAN TOWEL.   10. Wear CLEAN PAJAMAS   11. Place CLEAN SHEETS on your bed the night of your first shower and DO NOT SLEEP WITH PETS.    Day of Surgery:  Do not apply any deodorants/lotions. Please wear clean clothes to the hospital/surgery center.      Please read over the following fact sheets that you were given.

## 2016-10-01 NOTE — Telephone Encounter (Signed)
Discussed with Gloria Smith that her genetic test results were negative for known pathogenic mutations within any of 20 genes on the Breast/Ovarian Cancer Panel through [facility].  One variant of uncertain significance, "c.3571A>G (p.Ile1191Val)" was found in one copy of the BRIP1 gene.  Reviewed that we treat this just like a negative test result until it gets updated by the lab and reviewed why we do that.  Encouraged Gloria Smith to keep her phone number up-to-date with [facility], so that we can call her if this result gets updated in the future.  She should continue to follow her doctors' recommendations for future cancers screening.  Women in the family are at a somewhat higher risk for breast cancer, simply due to the family history.  Her sister should make her doctor aware of the history.  Her nieces can begin annual mammograms earlier at age 9.  Gloria Smith would like a copy of her results emailed, and I am happy to do that.  She knows she is welcome to call or email with any questions.

## 2016-10-04 ENCOUNTER — Ambulatory Visit: Payer: Self-pay | Admitting: Genetic Counselor

## 2016-10-04 DIAGNOSIS — Z170 Estrogen receptor positive status [ER+]: Secondary | ICD-10-CM

## 2016-10-04 DIAGNOSIS — C50412 Malignant neoplasm of upper-outer quadrant of left female breast: Secondary | ICD-10-CM

## 2016-10-04 DIAGNOSIS — Z800 Family history of malignant neoplasm of digestive organs: Secondary | ICD-10-CM

## 2016-10-04 DIAGNOSIS — Z1379 Encounter for other screening for genetic and chromosomal anomalies: Secondary | ICD-10-CM

## 2016-10-04 MED ORDER — CEFAZOLIN SODIUM-DEXTROSE 2-4 GM/100ML-% IV SOLN
2.0000 g | INTRAVENOUS | Status: AC
  Administered 2016-10-05: 2 g via INTRAVENOUS

## 2016-10-04 MED ORDER — GABAPENTIN 300 MG PO CAPS
300.0000 mg | ORAL_CAPSULE | ORAL | Status: AC
  Administered 2016-10-05: 300 mg via ORAL
  Filled 2016-10-04: qty 1

## 2016-10-04 NOTE — Anesthesia Preprocedure Evaluation (Addendum)
Anesthesia Evaluation  Patient identified by MRN, date of birth, ID band Patient awake    Reviewed: Allergy & Precautions, NPO status , Patient's Chart, lab work & pertinent test results  History of Anesthesia Complications Negative for: history of anesthetic complications  Airway Mallampati: II  TM Distance: >3 FB Neck ROM: Full    Dental  (+) Missing, Dental Advisory Given, Chipped   Pulmonary former smoker (recently quit),    breath sounds clear to auscultation       Cardiovascular negative cardio ROS   Rhythm:Regular Rate:Normal     Neuro/Psych negative neurological ROS     GI/Hepatic negative GI ROS, Neg liver ROS,   Endo/Other  negative endocrine ROS  Renal/GU negative Renal ROS     Musculoskeletal   Abdominal   Peds  Hematology negative hematology ROS (+)   Anesthesia Other Findings   Reproductive/Obstetrics                            Anesthesia Physical Anesthesia Plan  ASA: II  Anesthesia Plan: General   Post-op Pain Management: GA combined w/ Regional for post-op pain   Induction: Intravenous  Airway Management Planned: LMA  Additional Equipment:   Intra-op Plan:   Post-operative Plan:   Informed Consent: I have reviewed the patients History and Physical, chart, labs and discussed the procedure including the risks, benefits and alternatives for the proposed anesthesia with the patient or authorized representative who has indicated his/her understanding and acceptance.   Dental advisory given  Plan Discussed with: CRNA and Surgeon  Anesthesia Plan Comments: (Plan routine monitors, GA-LMA ok, pec block for post op analgesia)        Anesthesia Quick Evaluation

## 2016-10-04 NOTE — Progress Notes (Signed)
GENETIC TEST RESULT  HPI: Ms. Gloria Smith was previously seen in the [facility] due to a personal history of breast cancer at 26, distant family history of breast cancer and family history of colon cancer, and concerns regarding a hereditary predisposition to cancer. Please refer to our prior cancer genetics clinic note from September 16, 2016 for more information regarding Ms. Gloria Smith's medical, social and family histories, and our assessment and recommendations, at the time. Ms. Gloria Smith recent genetic test results were disclosed to her, as were recommendations warranted by these results. These results and recommendations are discussed in more detail below.  GENETIC TEST RESULTS: At the time of Ms. Gloria Smith's visit on 09/16/16, we recommended she pursue genetic testing of the 20-gene Breast/Ovarian Cancer Panel through [facility]. The Breast/Ovarian Cancer Panel offered by [facility] Hope Pigeon, MD) includes sequencing and deletion/duplication analysis for the following 19 genes:  ATM, BARD1, BRCA1, BRCA2, BRIP1, CDH1, CHEK2, FANCC, MLH1, MSH2, MSH6, NBN, PALB2, PMS2, PTEN, RAD51C, RAD51D, TP53, and XRCC2.  This panel also includes deletion/duplication analysis (without sequencing) for one gene, EPCAM.  Those results are now back, the report date for which is October 01, 2016.  Genetic testing was normal, and did not reveal a deleterious mutation in these genes.  One variant of uncertain significance (VUS) was found in the BRIP1 gene.  The test report will be scanned into EPIC and will be located under the Results Review tab in the Pathology>Molecular Pathology section.   Genetic testing did identify a variant of uncertain significance (VUS) called "c.3571A>G (p.Ile1191Val)" in one copy of the BRIP1 gene. At this time, it is unknown if this VUS is associated with an increased risk for cancer or if this is a normal finding. Since this VUS result is uncertain,  it cannot help guide screening recommendations, and family members should not be tested for this VUS to help define their own cancer risks.  Also, we all have variants within our genes that make [place] unique individuals--most of these variants are benign.  Thus, we treat this VUS as a negative result until it gets updated by the lab.   With time, we suspect the lab will reclassify this variant and when they do, we will try to re-contact Ms. Gloria Smith to discuss the reclassification further.  We also encouraged Ms. Gloria Smith to contact us in a year or two to obtain an update on the status of this VUS.  We discussed with Ms. Gloria Smith that since the current genetic testing is not perfect, it is possible there may be a gene mutation in one of these genes that current testing cannot detect, but that chance is small. We also discussed, that it is possible that another gene that has not yet been discovered, or that we have not yet tested, is responsible for the cancer diagnoses in the family, and it is, therefore, important to remain in touch with cancer genetics in the future so that we can continue to offer Ms. Gloria Smith the most up-to-date genetic testing.   CANCER SCREENING RECOMMENDATIONS: This result is reassuring and indicates that Ms. Gloria Smith likely does not have an increased risk for a future cancer due to a mutation in one of these genes. This normal test also suggests that Ms. Gloria Smith's cancer was most likely not due to an inherited predisposition associated with one of these genes.  Most cancers happen by chance and this negative test suggests that her cancer falls into this category.  We, therefore, recommended she continue  to follow the cancer management and screening guidelines provided by her oncology and primary healthcare providers.   RECOMMENDATIONS FOR FAMILY MEMBERS: Women in this family might be at some increased risk of developing breast cancer, over the general population risk, simply due  to the family history of breast cancer. We recommended women in this family have a yearly mammogram beginning at age 40, or 73 years younger than the earliest onset of cancer, an annual clinical breast exam, and perform monthly breast self-exams.  Ms. Gloria Smith nieces can begin annual mammogram screening at the age of 39 based on current guidelines.  Women in this family should also have a gynecological exam as recommended by their primary provider. All family members should have a colonoscopy by age 46.  FOLLOW-UP: Lastly, we discussed with Ms. Gloria Smith that cancer genetics is a rapidly advancing field and it is possible that new genetic tests will be appropriate for her and/or her family members in the future. We encouraged her to remain in contact with cancer genetics on an annual basis so we can update her personal and family histories and let her know of advances in cancer genetics that may benefit this family.   Our contact number was provided. Ms. Gloria Smith questions were answered to her satisfaction, and she knows she is welcome to call us at anytime with additional questions or concerns.   Jeanine Luz, MS, [facility] Certified Genetic Counselor [email or web address] Phone: [phone]

## 2016-10-05 ENCOUNTER — Encounter (HOSPITAL_COMMUNITY): Payer: Self-pay | Admitting: *Deleted

## 2016-10-05 ENCOUNTER — Encounter (HOSPITAL_COMMUNITY)
Admission: RE | Disposition: A | Discharge status: Home / Self Care | Payer: Self-pay | Source: Ambulatory Visit | Attending: General Surgery

## 2016-10-05 ENCOUNTER — Ambulatory Visit (HOSPITAL_COMMUNITY)
Admission: RE | Admit: 2016-10-05 | Discharge: 2016-10-05 | Disposition: A | Discharge status: Home / Self Care | Payer: Managed Care, Other (non HMO) | Source: Ambulatory Visit | Attending: General Surgery | Admitting: General Surgery

## 2016-10-05 ENCOUNTER — Ambulatory Visit (HOSPITAL_COMMUNITY): Payer: Managed Care, Other (non HMO) | Admitting: Certified Registered Nurse Anesthetist

## 2016-10-05 DIAGNOSIS — Z170 Estrogen receptor positive status [ER+]: Secondary | ICD-10-CM | POA: Diagnosis not present

## 2016-10-05 DIAGNOSIS — Z8249 Family history of ischemic heart disease and other diseases of the circulatory system: Secondary | ICD-10-CM | POA: Diagnosis not present

## 2016-10-05 DIAGNOSIS — Z820 Family history of epilepsy and other diseases of the nervous system: Secondary | ICD-10-CM | POA: Insufficient documentation

## 2016-10-05 DIAGNOSIS — Z8349 Family history of other endocrine, nutritional and metabolic diseases: Secondary | ICD-10-CM | POA: Insufficient documentation

## 2016-10-05 DIAGNOSIS — Z803 Family history of malignant neoplasm of breast: Secondary | ICD-10-CM | POA: Diagnosis not present

## 2016-10-05 DIAGNOSIS — Z811 Family history of alcohol abuse and dependence: Secondary | ICD-10-CM | POA: Diagnosis not present

## 2016-10-05 DIAGNOSIS — M549 Dorsalgia, unspecified: Secondary | ICD-10-CM | POA: Insufficient documentation

## 2016-10-05 DIAGNOSIS — D0512 Intraductal carcinoma in situ of left breast: Secondary | ICD-10-CM | POA: Diagnosis not present

## 2016-10-05 DIAGNOSIS — Z8261 Family history of arthritis: Secondary | ICD-10-CM | POA: Diagnosis not present

## 2016-10-05 DIAGNOSIS — C50912 Malignant neoplasm of unspecified site of left female breast: Secondary | ICD-10-CM | POA: Diagnosis present

## 2016-10-05 DIAGNOSIS — Z800 Family history of malignant neoplasm of digestive organs: Secondary | ICD-10-CM | POA: Diagnosis not present

## 2016-10-05 DIAGNOSIS — Z87891 Personal history of nicotine dependence: Secondary | ICD-10-CM | POA: Insufficient documentation

## 2016-10-05 DIAGNOSIS — Z823 Family history of stroke: Secondary | ICD-10-CM | POA: Diagnosis not present

## 2016-10-05 DIAGNOSIS — C773 Secondary and unspecified malignant neoplasm of axilla and upper limb lymph nodes: Secondary | ICD-10-CM | POA: Diagnosis not present

## 2016-10-05 DIAGNOSIS — C50412 Malignant neoplasm of upper-outer quadrant of left female breast: Secondary | ICD-10-CM

## 2016-10-05 HISTORY — PX: BREAST LUMPECTOMY WITH RADIOACTIVE SEED AND SENTINEL LYMPH NODE BIOPSY: SHX6550

## 2016-10-05 SURGERY — BREAST LUMPECTOMY WITH RADIOACTIVE SEED AND SENTINEL LYMPH NODE BIOPSY
Anesthesia: General | Site: Breast | Laterality: Left

## 2016-10-05 MED ORDER — TECHNETIUM TC 99M SULFUR COLLOID FILTERED
1.0000 | Freq: Once | INTRAVENOUS | Status: AC | PRN
  Administered 2016-10-05: 1 via INTRADERMAL

## 2016-10-05 MED ORDER — BUPIVACAINE HCL 0.25 % IJ SOLN
INTRAMUSCULAR | Status: DC | PRN
  Administered 2016-10-05: 30 mL

## 2016-10-05 MED ORDER — KETOROLAC TROMETHAMINE 30 MG/ML IJ SOLN
INTRAMUSCULAR | Status: AC
  Filled 2016-10-05: qty 1

## 2016-10-05 MED ORDER — 0.9 % SODIUM CHLORIDE (POUR BTL) OPTIME
TOPICAL | Status: DC | PRN
  Administered 2016-10-05: 1000 mL

## 2016-10-05 MED ORDER — MORPHINE SULFATE (PF) 2 MG/ML IV SOLN: 2.0000 mg | INTRAVENOUS | Status: DC | PRN

## 2016-10-05 MED ORDER — ACETAMINOPHEN 325 MG PO TABS
ORAL_TABLET | ORAL | Status: AC
  Filled 2016-10-05: qty 2

## 2016-10-05 MED ORDER — MEPERIDINE HCL 25 MG/ML IJ SOLN: 6.2500 mg | INTRAMUSCULAR | Status: DC | PRN

## 2016-10-05 MED ORDER — LACTATED RINGERS IV SOLN
INTRAVENOUS | Status: DC
  Administered 2016-10-05 (×2): via INTRAVENOUS

## 2016-10-05 MED ORDER — BUPIVACAINE-EPINEPHRINE (PF) 0.5% -1:200000 IJ SOLN
INTRAMUSCULAR | Status: DC | PRN
  Administered 2016-10-05: 30 mL

## 2016-10-05 MED ORDER — SODIUM CHLORIDE 0.9 % IV SOLN: 250.0000 mL | INTRAVENOUS | Status: DC | PRN

## 2016-10-05 MED ORDER — SODIUM CHLORIDE 0.9% FLUSH: 3.0000 mL | Freq: Two times a day (BID) | INTRAVENOUS | Status: DC

## 2016-10-05 MED ORDER — MIDAZOLAM HCL 2 MG/2ML IJ SOLN
INTRAMUSCULAR | Status: AC
  Administered 2016-10-05: 2 mg via INTRAVENOUS
  Filled 2016-10-05: qty 2

## 2016-10-05 MED ORDER — METHYLENE BLUE 0.5 % INJ SOLN
INTRAVENOUS | Status: AC
  Filled 2016-10-05: qty 10

## 2016-10-05 MED ORDER — PROPOFOL 10 MG/ML IV BOLUS
INTRAVENOUS | Status: DC | PRN
  Administered 2016-10-05: 150 mg via INTRAVENOUS

## 2016-10-05 MED ORDER — SUCCINYLCHOLINE CHLORIDE 200 MG/10ML IV SOSY
PREFILLED_SYRINGE | INTRAVENOUS | Status: AC
  Filled 2016-10-05: qty 10

## 2016-10-05 MED ORDER — ONDANSETRON HCL 4 MG/2ML IJ SOLN
INTRAMUSCULAR | Status: AC
  Filled 2016-10-05: qty 4

## 2016-10-05 MED ORDER — MIDAZOLAM HCL 2 MG/2ML IJ SOLN
1.0000 mg | Freq: Once | INTRAMUSCULAR | Status: AC
  Administered 2016-10-05: 2 mg via INTRAVENOUS

## 2016-10-05 MED ORDER — ACETAMINOPHEN 500 MG PO TABS
1000.0000 mg | ORAL_TABLET | ORAL | Status: AC
  Administered 2016-10-05: 1000 mg via ORAL
  Filled 2016-10-05: qty 2

## 2016-10-05 MED ORDER — SODIUM CHLORIDE 0.9% FLUSH: 3.0000 mL | INTRAVENOUS | Status: DC | PRN

## 2016-10-05 MED ORDER — PROMETHAZINE HCL 25 MG/ML IJ SOLN
6.2500 mg | INTRAMUSCULAR | Status: DC | PRN
Start: 2016-10-05 — End: 2016-10-05

## 2016-10-05 MED ORDER — MIDAZOLAM HCL 2 MG/2ML IJ SOLN: 0.5000 mg | Freq: Once | INTRAMUSCULAR | Status: DC | PRN

## 2016-10-05 MED ORDER — OXYCODONE-ACETAMINOPHEN 10-325 MG PO TABS: 1.0000 | ORAL_TABLET | Freq: Four times a day (QID) | ORAL | 0 refills | Status: DC | PRN

## 2016-10-05 MED ORDER — FENTANYL CITRATE (PF) 100 MCG/2ML IJ SOLN
INTRAMUSCULAR | Status: AC
  Filled 2016-10-05: qty 4

## 2016-10-05 MED ORDER — FENTANYL CITRATE (PF) 100 MCG/2ML IJ SOLN
INTRAMUSCULAR | Status: DC | PRN
  Administered 2016-10-05: 25 ug via INTRAVENOUS

## 2016-10-05 MED ORDER — BUPIVACAINE HCL (PF) 0.25 % IJ SOLN
INTRAMUSCULAR | Status: AC
  Filled 2016-10-05: qty 30

## 2016-10-05 MED ORDER — ACETAMINOPHEN 650 MG RE SUPP: 650.0000 mg | RECTAL | Status: DC | PRN

## 2016-10-05 MED ORDER — OXYCODONE HCL 5 MG PO TABS
5.0000 mg | ORAL_TABLET | ORAL | Status: DC | PRN
  Administered 2016-10-05: 10 mg via ORAL

## 2016-10-05 MED ORDER — HYDROMORPHONE HCL 1 MG/ML IJ SOLN: 0.2500 mg | INTRAMUSCULAR | Status: DC | PRN

## 2016-10-05 MED ORDER — LIDOCAINE 2% (20 MG/ML) 5 ML SYRINGE
INTRAMUSCULAR | Status: AC
  Filled 2016-10-05: qty 5

## 2016-10-05 MED ORDER — FENTANYL CITRATE (PF) 100 MCG/2ML IJ SOLN
INTRAMUSCULAR | Status: AC
  Administered 2016-10-05: 100 ug via INTRAVENOUS
  Filled 2016-10-05: qty 2

## 2016-10-05 MED ORDER — ACETAMINOPHEN 325 MG PO TABS
650.0000 mg | ORAL_TABLET | ORAL | Status: DC | PRN
  Administered 2016-10-05: 650 mg via ORAL

## 2016-10-05 MED ORDER — FENTANYL CITRATE (PF) 100 MCG/2ML IJ SOLN
50.0000 ug | Freq: Once | INTRAMUSCULAR | Status: AC
  Administered 2016-10-05: 100 ug via INTRAVENOUS

## 2016-10-05 MED ORDER — ONDANSETRON HCL 4 MG/2ML IJ SOLN
INTRAMUSCULAR | Status: AC
  Filled 2016-10-05: qty 2

## 2016-10-05 MED ORDER — BUPIVACAINE-EPINEPHRINE 0.25% -1:200000 IJ SOLN
INTRAMUSCULAR | Status: DC | PRN
  Administered 2016-10-05: 30 mL

## 2016-10-05 MED ORDER — ONDANSETRON HCL 4 MG/2ML IJ SOLN
INTRAMUSCULAR | Status: DC | PRN
  Administered 2016-10-05: 4 mg via INTRAVENOUS

## 2016-10-05 MED ORDER — KETOROLAC TROMETHAMINE 30 MG/ML IJ SOLN
INTRAMUSCULAR | Status: DC | PRN
  Administered 2016-10-05: 30 mg via INTRAVENOUS

## 2016-10-05 MED ORDER — MIDAZOLAM HCL 2 MG/2ML IJ SOLN
INTRAMUSCULAR | Status: AC
  Filled 2016-10-05: qty 2

## 2016-10-05 MED ORDER — PROPOFOL 10 MG/ML IV BOLUS
INTRAVENOUS | Status: AC
  Filled 2016-10-05: qty 40

## 2016-10-05 MED ORDER — LIDOCAINE 2% (20 MG/ML) 5 ML SYRINGE
INTRAMUSCULAR | Status: DC | PRN
Start: 2016-10-05 — End: 2016-10-05
  Administered 2016-10-05: 20 mg via INTRAVENOUS

## 2016-10-05 MED ORDER — OXYCODONE HCL 5 MG PO TABS
ORAL_TABLET | ORAL | Status: AC
  Filled 2016-10-05: qty 2

## 2016-10-05 MED ORDER — SODIUM CHLORIDE 0.9 % IV SOLN: INTRAVENOUS | Status: DC

## 2016-10-05 SURGICAL SUPPLY — 57 items
APPLIER CLIP 9.375 MED OPEN (MISCELLANEOUS) ×3
BINDER BREAST LRG (GAUZE/BANDAGES/DRESSINGS) IMPLANT
BINDER BREAST XLRG (GAUZE/BANDAGES/DRESSINGS) IMPLANT
BLADE SURG 10 STRL SS (BLADE) ×3 IMPLANT
BLADE SURG 15 STRL LF DISP TIS (BLADE) ×1 IMPLANT
BLADE SURG 15 STRL SS (BLADE) ×2
CANISTER SUCTION 2500CC (MISCELLANEOUS) IMPLANT
CHLORAPREP W/TINT 26ML (MISCELLANEOUS) ×3 IMPLANT
CLIP APPLIE 9.375 MED OPEN (MISCELLANEOUS) ×1 IMPLANT
CLOSURE WOUND 1/2 X4 (GAUZE/BANDAGES/DRESSINGS) ×1
CONT SPEC 4OZ CLIKSEAL STRL BL (MISCELLANEOUS) ×6 IMPLANT
COVER PROBE W GEL 5X96 (DRAPES) ×3 IMPLANT
COVER SURGICAL LIGHT HANDLE (MISCELLANEOUS) ×3 IMPLANT
DERMABOND ADVANCED (GAUZE/BANDAGES/DRESSINGS) ×2
DERMABOND ADVANCED .7 DNX12 (GAUZE/BANDAGES/DRESSINGS) ×1 IMPLANT
DRAPE CHEST BREAST 15X10 FENES (DRAPES) ×3 IMPLANT
DRAPE UTILITY XL STRL (DRAPES) ×3 IMPLANT
DRSG PAD ABDOMINAL 8X10 ST (GAUZE/BANDAGES/DRESSINGS) ×3 IMPLANT
ELECT COATED BLADE 2.86 ST (ELECTRODE) ×3 IMPLANT
ELECT REM PT RETURN 9FT ADLT (ELECTROSURGICAL) ×3
ELECTRODE REM PT RTRN 9FT ADLT (ELECTROSURGICAL) ×1 IMPLANT
GLOVE BIO SURGEON STRL SZ7 (GLOVE) ×3 IMPLANT
GLOVE BIOGEL PI IND STRL 7.5 (GLOVE) ×1 IMPLANT
GLOVE BIOGEL PI INDICATOR 7.5 (GLOVE) ×2
GLOVE ECLIPSE 7.0 STRL STRAW (GLOVE) ×3 IMPLANT
GLOVE SURG SS PI 6.5 STRL IVOR (GLOVE) ×3 IMPLANT
GOWN STRL REUS W/ TWL LRG LVL3 (GOWN DISPOSABLE) ×2 IMPLANT
GOWN STRL REUS W/TWL LRG LVL3 (GOWN DISPOSABLE) ×4
ILLUMINATOR WAVEGUIDE N/F (MISCELLANEOUS) ×3 IMPLANT
KIT BASIN OR (CUSTOM PROCEDURE TRAY) ×3 IMPLANT
KIT MARKER MARGIN INK (KITS) ×3 IMPLANT
MARKER SKIN DUAL TIP RULER LAB (MISCELLANEOUS) ×3 IMPLANT
NDL SAFETY ECLIPSE 18X1.5 (NEEDLE) IMPLANT
NEEDLE FILTER BLUNT 18X 1/2SAF (NEEDLE)
NEEDLE FILTER BLUNT 18X1 1/2 (NEEDLE) IMPLANT
NEEDLE HYPO 18GX1.5 SHARP (NEEDLE)
NEEDLE HYPO 25X1 1.5 SAFETY (NEEDLE) ×3 IMPLANT
NS IRRIG 1000ML POUR BTL (IV SOLUTION) ×3 IMPLANT
PACK SURGICAL SETUP 50X90 (CUSTOM PROCEDURE TRAY) ×3 IMPLANT
PENCIL BUTTON HOLSTER BLD 10FT (ELECTRODE) ×3 IMPLANT
SPONGE GAUZE 4X4 12PLY STER LF (GAUZE/BANDAGES/DRESSINGS) ×3 IMPLANT
SPONGE LAP 18X18 X RAY DECT (DISPOSABLE) ×3 IMPLANT
STRIP CLOSURE SKIN 1/2X4 (GAUZE/BANDAGES/DRESSINGS) ×2 IMPLANT
SUT MNCRL AB 4-0 PS2 18 (SUTURE) ×6 IMPLANT
SUT SILK 2 0 SH (SUTURE) IMPLANT
SUT VIC AB 2-0 SH 27 (SUTURE) ×4
SUT VIC AB 2-0 SH 27XBRD (SUTURE) ×2 IMPLANT
SUT VIC AB 3-0 SH 27 (SUTURE) ×2
SUT VIC AB 3-0 SH 27X BRD (SUTURE) ×1 IMPLANT
SYR BULB 3OZ (MISCELLANEOUS) ×3 IMPLANT
SYR CONTROL 10ML LL (SYRINGE) ×3 IMPLANT
TAPE CLOTH SURG 4X10 WHT LF (GAUZE/BANDAGES/DRESSINGS) ×3 IMPLANT
TOWEL OR 17X24 6PK STRL BLUE (TOWEL DISPOSABLE) ×3 IMPLANT
TOWEL OR 17X26 10 PK STRL BLUE (TOWEL DISPOSABLE) ×3 IMPLANT
TUBE CONNECTING 12'X1/4 (SUCTIONS) ×1
TUBE CONNECTING 12X1/4 (SUCTIONS) ×2 IMPLANT
YANKAUER SUCT BULB TIP NO VENT (SUCTIONS) ×3 IMPLANT

## 2016-10-05 NOTE — Transfer of Care (Signed)
Immediate Anesthesia Transfer of Care Note  Patient: Gloria Smith  Procedure(s) Performed: Procedure(s): LEFT BREAST RADIOACTIVE SEED GUIDED LUMPECTOMY, LEFT AXILLARY RADIOACTIVE SEED GUIDED NODE EXCISION, LEFT AXILLARY SENTINEL LYMPH NODE BIOPSY(LEFT TARGETED AXILLARY DISSECTION) (Left)  Patient Location: PACU  Anesthesia Type:GA combined with regional for post-op pain  Level of Consciousness: awake, alert , oriented and patient cooperative  Airway & Oxygen Therapy: Patient Spontanous Breathing and Patient connected to nasal cannula oxygen  Post-op Assessment: Report given to RN, Post -op Vital signs reviewed and stable and Patient moving all extremities X 4  Post vital signs: Reviewed and stable  Last Vitals:  Vitals:   10/05/16 0820 10/05/16 1018  BP:    Pulse: 86 76  Resp: 16   Temp:  36.4 C    Last Pain:  Vitals:   10/05/16 0625  TempSrc: Oral         Complications: No apparent anesthesia complications

## 2016-10-05 NOTE — Anesthesia Postprocedure Evaluation (Signed)
Anesthesia Post Note  Patient: Gloria Smith  Procedure(s) Performed: Procedure(s) (LRB): LEFT BREAST RADIOACTIVE SEED GUIDED LUMPECTOMY, LEFT AXILLARY RADIOACTIVE SEED GUIDED NODE EXCISION, LEFT AXILLARY SENTINEL LYMPH NODE BIOPSY(LEFT TARGETED AXILLARY DISSECTION) (Left)  Patient location during evaluation: PACU Anesthesia Type: General and Regional Level of consciousness: awake and alert, oriented and patient cooperative Pain management: pain level controlled Vital Signs Assessment: post-procedure vital signs reviewed and stable Respiratory status: spontaneous breathing, nonlabored ventilation and respiratory function stable Cardiovascular status: blood pressure returned to baseline and stable Postop Assessment: no signs of nausea or vomiting Anesthetic complications: no    Last Vitals:  Vitals:   10/05/16 1045 10/05/16 1055  BP: 107/75 112/66  Pulse: 67 62  Resp: (!) 21 20  Temp:      Last Pain:  Vitals:   10/05/16 1018  TempSrc:   PainSc: 0-No pain                 Amariona Rathje,E. Anyeli Hockenbury

## 2016-10-05 NOTE — Discharge Instructions (Signed)
[facility] °Office Phone Number [phone] °POST OP INSTRUCTIONS ° °Always review your discharge instruction sheet given to you by the facility where your surgery was performed. ° °IF YOU HAVE DISABILITY OR FAMILY LEAVE FORMS, YOU MUST BRING THEM TO THE OFFICE FOR PROCESSING.  DO NOT GIVE THEM TO YOUR DOCTOR. ° °1. A prescription for pain medication may be given to you upon discharge.  Take your pain medication as prescribed, if needed.  If narcotic pain medicine is not needed, then you may take acetaminophen (Tylenol), naprosyn (Alleve) or ibuprofen (Advil) as needed. °2. Take your usually prescribed medications unless otherwise directed °3. If you need a refill on your pain medication, please contact your pharmacy.  They will contact our office to request authorization.  Prescriptions will not be filled after 5pm or on week-ends. °4. You should eat very light the first 24 hours after surgery, such as soup, crackers, pudding, etc.  Resume your normal diet the day after surgery. °5. Most patients will experience some swelling and bruising in the breast.  Ice packs and a good support bra will help.  Wear the breast binder provided or a sports bra for 72 hours day and night.  After that wear a sports bra during the day until you return to the office. Swelling and bruising can take several days to resolve.  °6. It is common to experience some constipation if taking pain medication after surgery.  Increasing fluid intake and taking a stool softener will usually help or prevent this problem from occurring.  A mild laxative (Milk of Magnesia or Miralax) should be taken according to package directions if there are no bowel movements after 48 hours. °7. Unless discharge instructions indicate otherwise, you may remove your bandages 48 hours after surgery and you may shower at that time.  You may have steri-strips (small skin tapes) in place directly over the incision.  These strips should be left on the  skin for 7-10 days and will come off on their own.  If your surgeon used skin glue on the incision, you may shower in 24 hours.  The glue will flake off over the next 2-3 weeks.  Any sutures or staples will be removed at the office during your follow-up visit. °8. ACTIVITIES:  You may resume regular daily activities (gradually increasing) beginning the next day.  Wearing a good support bra or sports bra minimizes pain and swelling.  You may have sexual intercourse when it is comfortable. °a. You may drive when you no longer are taking prescription pain medication, you can comfortably wear a seatbelt, and you can safely maneuver your car and apply brakes. °b. RETURN TO WORK:  ______________________________________________________________________________________ °9. You should see your doctor in the office for a follow-up appointment approximately two weeks after your surgery.  Your doctor’s nurse will typically make your follow-up appointment when she calls you with your pathology report.  Expect your pathology report 3-4 business days after your surgery.  You may call to check if you do not hear from us after three days. °10. OTHER INSTRUCTIONS: _______________________________________________________________________________________________ _____________________________________________________________________________________________________________________________________ °_____________________________________________________________________________________________________________________________________ °_____________________________________________________________________________________________________________________________________ ° °WHEN TO CALL DR Tanysha Quant: °1. Fever over 101.0 °2. Nausea and/or vomiting. °3. Extreme swelling or bruising. °4. Continued bleeding from incision. °5. Increased pain, redness, or drainage from the incision. ° °The clinic staff is available to answer your questions during regular  business hours.  Please don’t hesitate to call and ask to speak to one of the nurses for clinical concerns.  If you   have a medical emergency, go to the nearest emergency room or call 911.  A surgeon from [facility] is always on call at the hospital. ° °For further questions, please visit [email or web address] ° °

## 2016-10-05 NOTE — Anesthesia Procedure Notes (Signed)
Procedure Name: LMA Insertion Date/Time: 10/05/2016 8:47 AM Performed by: Everlean Cherry A Pre-anesthesia Checklist: Patient identified, Emergency Drugs available, Suction available and Patient being monitored Patient Re-evaluated:Patient Re-evaluated prior to inductionOxygen Delivery Method: Circle system utilized Preoxygenation: Pre-oxygenation with 100% oxygen Intubation Type: IV induction Ventilation: Mask ventilation without difficulty LMA: LMA inserted LMA Size: 4.0 Number of attempts: 1 Placement Confirmation: positive ETCO2 and breath sounds checked- equal and bilateral Tube secured with: Tape Dental Injury: Teeth and Oropharynx as per pre-operative assessment

## 2016-10-05 NOTE — Anesthesia Procedure Notes (Addendum)
Anesthesia Regional Block:  Pectoralis block  Pre-Anesthetic Checklist: ,, timeout performed, Correct Patient, Correct Site, Correct Laterality, Correct Procedure, Correct Position, site marked, Risks and benefits discussed, pre-op evaluation,  At surgeon's request and post-op pain management  Laterality: Left  Prep: Maximum Sterile Barrier Precautions used, chloraprep       Needles:  Injection technique: Single-shot  Needle Type: Echogenic Stimulator Needle     Needle Length: 9cm 9 cm Needle Gauge: 21 and 21 G    Additional Needles:  Procedures: ultrasound guided (picture in chart) Pectoralis block Narrative:  Start time: 10/05/2016 8:05 AM End time: 10/05/2016 8:15 AM Injection made incrementally with aspirations every 5 mL. Anesthesiologist: Roderic Palau  Additional Notes: 2% Lidocaine skin wheel.

## 2016-10-05 NOTE — Interval H&P Note (Signed)
History and Physical Interval Note:  10/05/2016 8:30 AM  [name]  has presented today for surgery, with the diagnosis of LEFT BREAST CANCER  The various methods of treatment have been discussed with the patient and family. After consideration of risks, benefits and other options for treatment, the patient has consented to  Procedure(s): LEFT BREAST RADIOACTIVE SEED GUIDED LUMPECTOMY, LEFT AXILLARY RADIOACTIVE SEED GUIDED NODE EXCISION, LEFT AXILLARY SENTINEL LYMPH NODE BIOPSY(LEFT TARGETED AXILLARY DISSECTION) (Left) as a surgical intervention .  The patient's history has been reviewed, patient examined, no change in status, stable for surgery.  I have reviewed the patient's chart and labs.  Questions were answered to the patient's satisfaction.     Gloria Smith

## 2016-10-05 NOTE — H&P (Signed)
45 yof seen in consultation from Dr Kyung Rudd presents with new diagnosis of left breast cancer. She has family history of breast cancer in a paternal cousing at age 46 who is deceased from breast cancer. she had no mass or discharge noted. she has no prior breast history. she underwent screening mm that c density breasts. she had bilateral masses that right ended up being a cyst. the left side has a 1 cm oval mass in the left breast at 3oclock posteriorly. there is also a node in that area in left axilla with focal cortical thickening. biopsy of both is performed. with finding of grade II IDC that is er/pr pos her2 negative and Ki is 5%. the node is positive for IDC with ece. she is here today to discuss options. 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 she is from [place] and moved to [place] a couple years ago from [place].   Other Problems Conni Slipper, RN; 09/15/2016 8:14 AM) Back Pain Hemorrhoids Lump In Breast  Past Surgical History Conni Slipper, RN; 09/15/2016 8:13 AM) Breast Biopsy Left. Knee Surgery Right.  Diagnostic Studies History Conni Slipper, RN; 09/15/2016 8:13 AM) Colonoscopy never Mammogram within last year Pap Smear 1-5 years ago  Medication History Conni Slipper, RN; 09/15/2016 8:14 AM) No Current Medications Medications Reconciled  Social History Conni Slipper, RN; 09/15/2016 8:14 AM) Alcohol use Moderate alcohol use. Caffeine use Coffee. No drug use Tobacco use Former smoker.  Family History Conni Slipper, RN; 09/15/2016 8:14 AM) Alcohol Abuse Family Members In General. Arthritis Family Members In General. Breast Cancer Family Members In General. Cerebrovascular Accident Family Members In [place] Members In General. Heart Disease Family Members In General. Hypertension Father. Seizure disorder Family Members In General. Thyroid problems Mother.  Pregnancy / Birth History Conni Slipper, RN; 09/15/2016 8:14  AM) Age at menarche 45 years. Contraceptive History Depo-provera, Intrauterine device, Oral contraceptives. Gravida 4 Maternal age 35-20 Para 1    Review of Systems Conni Slipper RN; 09/15/2016 8:14 AM) General Present- Weight Gain. Not Present- Appetite Loss, Chills, Fatigue, Fever, Night Sweats and Weight Loss. Skin Present- Dryness. Not Present- Change in Wart/Mole, Hives, Jaundice, New Lesions, Non-Healing Wounds, Rash and Ulcer. HEENT Present- Wears glasses/contact lenses. Not Present- Earache, Hearing Loss, Hoarseness, Nose Bleed, Oral Ulcers, Ringing in the Ears, Seasonal Allergies, Sinus Pain, Sore Throat, Visual Disturbances and Yellow Eyes. Respiratory Not Present- Bloody sputum, Chronic Cough, Difficulty Breathing, Snoring and Wheezing. Breast Present- Breast Mass and Breast Pain. Not Present- Nipple Discharge and Skin Changes. Cardiovascular Present- Leg Cramps. Not Present- Chest Pain, Difficulty Breathing Lying Down, Palpitations, Rapid Heart Rate, Shortness of Breath and Swelling of Extremities. Gastrointestinal Present- Abdominal Pain, Bloating, Bloody Stool, Constipation, Excessive gas and Hemorrhoids. Not Present- Change in Bowel Habits, Chronic diarrhea, Difficulty Swallowing, Gets full quickly at meals, Indigestion, Nausea, Rectal Pain and Vomiting. Female Genitourinary Not Present- Frequency, Nocturia, Painful Urination, Pelvic Pain and Urgency. Musculoskeletal Present- Back Pain, Joint Stiffness and Muscle Pain. Not Present- Joint Pain, Muscle Weakness and Swelling of Extremities. Neurological Not Present- Decreased Memory, Fainting, Headaches, Numbness, Seizures, Tingling, Tremor, Trouble walking and Weakness. Psychiatric Present- Frequent crying. Not Present- Anxiety, Bipolar, Change in Sleep Pattern, Depression and Fearful. Endocrine Present- Cold Intolerance, Hair Changes and Hot flashes. Not Present- Excessive Hunger, Heat Intolerance and New Diabetes. Hematology  Not Present- Blood Thinners, Easy Bruising, Excessive bleeding, Gland problems, HIV and Persistent Infections.   Physical Exam Rolm Bookbinder MD; 09/15/2016 9:54 PM) General Mental Status-Alert. Orientation-Oriented  X3.  Chest and Lung Exam Chest and lung exam reveals -on auscultation, normal breath sounds, no adventitious sounds and normal vocal resonance.  Breast Nipples-No Discharge. Breast Lump-No Palpable Breast Mass.  Cardiovascular Cardiovascular examination reveals -on palpation PMI is normal in location and amplitude, no palpable S3 or S4. Normal cardiac borders..  Lymphatic Head & Neck  General Head & Neck Lymphatics: Bilateral - Description - Normal. Axillary  General Axillary Region: Bilateral - Description - Normal. Note: no Nash adenopathy     Assessment & Plan Rolm Bookbinder MD; 09/15/2016 9:58 PM) BREAST CANCER OF UPPER-OUTER QUADRANT OF LEFT FEMALE BREAST (C50.412) Story: Genetic testing, will check mammaprint to see if she gets chemotherapy, if she does need chemotherapy then will proceed with systemic therapy first and then surgery. if low risk or insufficient will proceed with surgery We discussed the staging and pathophysiology of breast cancer. We discussed all of the different options for treatment for breast cancer including surgery, chemotherapy, radiation therapy, Herceptin, and antiestrogen therapy. We discussed the options for treatment of the breast cancer which included lumpectomy versus a mastectomy. We discussed the performance of the lumpectomy with radioactive seed placement. We discussed a 5-10% chance of a positive margin requiring reexcision in the operating room. We also discussed that she will need radiation therapy if she undergoes lumpectomy. We discussed the mastectomy (removal of whole breast) and the postoperative care for that as well. Mastectomy can be followed by reconstruction. The decision for lumpectomy vs  mastectomy has no impact on decision for chemotherapy. We discussed that there is no difference in her survival whether she undergoes lumpectomy with radiation therapy or antiestrogen therapy versus a mastectomy. There is also no real difference between her recurrence in the breast. i think she is good candidate for lumpectomy now. Her nodes also need to be addressed. if she gets systemic therapy first and has no clinical disease option would be to proceed with tad, alnd or ideally enrolll in alliance trial. I f we proceed with surgery now would consider tad vs alnd. we discussed both of these options and elected to proceed with tad if surgery will be first. Will await genetics and mammaprint then proceed

## 2016-10-05 NOTE — Op Note (Signed)
Preoperative diagnosis: Left breast cancer, clinical stage II Postoperative diagnosis: same as above Procedure: 1. Leftbreast seed guided lumpectomy  2. Left targeted axillary dissection Surgeon Dr Serita Grammes Anes general with pectoral block EBL: minimal Comps none Specimen:  1. Left breast marked with paint 2. Left axillary sentinel node and targeted node containing seed 3. Left axillary sentinel node with count of 1411 Sponge count correct at completion dispo to recovery stable  Indications: This is a 82 yof who has node positive 1 cm breast cancer on diagnosis.  We discussed all options and she was seen in [facility]. Genetics are negative. Will proceed with lumpectomy and targeted axillary dissection.   Procedure: After informed consent was obtained the patient was taken to the OR. She was injected with technetium in the standard periareolar fashion. She underwent a pectoral block. She was given anitibiotics. SCDs were in place. She was prepped and draped in the standard sterile surgical fashion. A timeout was performed.  I then located the seed in the uoq. The seed in the node was not far from there at all as this is a low lying axillary node.   I made a curvilinear incision in the uoq.   I used the lighted retractor system to tunnel to the lesion. I then removed the seed with an attempt to get a clear margin. This was passed off the table after marking with paint. I had pathology review this grossly and the margins appear clear    I did confirm removal of theclipand seedwith mammography.I placed clips in the cavity. I went through the axillary fascia via the same incision. I identified the seed containing node that had been previously biopsied and removed this. The seed was confirmed by mammography.  This was also a sentinel node.  I identified the remaining sentinel nodes with the highest count as above. There was no background radioactivity. I obtained hemostasis. I then  closed the fascia with 2-0 vicryl.I closed the breast tissue with 2-0 vicryl. I closed the skin with 3-0 vicryl and 4-0 monocryl. Glue and steristrips were placed A breast binder was placed. She was extubated and transferred to recovery stable

## 2016-10-06 ENCOUNTER — Encounter (HOSPITAL_COMMUNITY): Payer: Self-pay | Admitting: General Surgery

## 2016-10-12 ENCOUNTER — Ambulatory Visit: Payer: Managed Care, Other (non HMO) | Admitting: Hematology

## 2016-10-19 NOTE — Progress Notes (Signed)
Location of Breast Cancer:Left Breast Upper Outer Quadrant   Histology per Pathology Report: Diagnosis 09/06/2016: 1. Breast, left, needle core biopsy, 3 o'clock - INVASIVE DUCTAL CARCINOMA.- DUCTAL CARCINOMA IN SITU.- SEE COMMENT. 2. Lymph node, axilla and upper limb, left - METASTATIC CARCINOMA IN 1 OF 1 LYMPH NODE (1/1), WITH EXTRACAPSULAR EXTENSION.  Receptor Status: ER( 100%+  ), PR ( 10%+  ), Her2-neu ( neg ratio 1.46 and 1.45  ), Ki-( 5% and 10%  )  Did patient present with symptoms (if so, please note symptoms) or was this found on screening mammography?: routine screening   Past/Anticipated interventions by surgeon, if PUL:GSPJSUNHR 10/05/2016:Dr. Rolm Bookbinder, MD :1. Breast, lumpectomy, Left - INVASIVE AND IN SITU DUCTAL CARCINOMA, 1 CM. - MARGINS NOT INVOLVED. - FIBROCYSTIC CHANGES. 2. Lymph node, sentinel, biopsy, Left #1 - METASTATIC CARCINOMA IN ONE LYMPH NODE WITH EXTRACAPSULAR EXTENSION (1/1). 3. Lymph node, sentinel, biopsy, Left #2 - METASTATIC CARCINOMA IN ONE LYMPH NODE WITH EXTRACAPSULAR EXTENSION (1/1).  Past/Anticipated interventions by medical oncology, if any: Chemotherapy : Dr. Burr Medico, MD genetic referral, Tamoxifen , follow up 10/25/2016  Lymphedema issues, if any:  No  Pain issues, if any:  Under left axilla and top of left back numbness  SAFETY ISSUES:  Prior radiation? NO  Pacemaker/ICD? NO  Possible current pregnancy? NO  Is the patient on methotrexate? NO  Current Complaints / other details: seen in  Multidisciplinary clinic 09/15/16, Menarche age 34, G32P1, IUD, Hollice Gong) no HRT, former smoker cigarettes 1/2ppd x 30 years, quit 08/22/2016, 1 glass wine per week, no drug use  Paternal grandmother dx age 11 colon cancer, cousin dx age 53 breast cancer   Allergies:NKA     Rebecca Eaton, RN 10/19/2016,11:54 AM  LMP 09/21/2016 (Exact Date)   BP 112/61 (BP Location: Right Arm, Patient Position: Sitting, Cuff Size: Normal)   Pulse 75    Temp 98.3 F (36.8 C) (Oral)   Resp 20   Ht '5\' 4"'$  (1.626 m)   Wt 169 lb 9.6 oz (76.9 kg)   LMP 09/21/2016 (Exact Date)   BMI 29.11 kg/m   Wt Readings from Last 3 Encounters:  10/21/16 169 lb 9.6 oz (76.9 kg)  10/05/16 164 lb (74.4 kg)  10/01/16 164 lb 3.2 oz (74.5 kg)

## 2016-10-20 NOTE — Addendum Note (Signed)
Encounter addended by: Kyung Rudd, MD on: 10/20/2016  6:57 AM<BR>    Actions taken: Edit attestation on clinical note

## 2016-10-21 ENCOUNTER — Ambulatory Visit
Admission: RE | Admit: 2016-10-21 | Discharge: 2016-10-21 | Disposition: A | Discharge status: Home / Self Care | Payer: Managed Care, Other (non HMO) | Source: Ambulatory Visit | Attending: Radiation Oncology | Admitting: Radiation Oncology

## 2016-10-21 ENCOUNTER — Encounter: Payer: Self-pay | Admitting: Radiation Oncology

## 2016-10-21 DIAGNOSIS — Z170 Estrogen receptor positive status [ER+]: Secondary | ICD-10-CM | POA: Diagnosis not present

## 2016-10-21 DIAGNOSIS — C50412 Malignant neoplasm of upper-outer quadrant of left female breast: Secondary | ICD-10-CM

## 2016-10-21 DIAGNOSIS — Z510 Encounter for antineoplastic radiation therapy: Secondary | ICD-10-CM | POA: Diagnosis present

## 2016-10-21 NOTE — Progress Notes (Signed)
Please see the Nurse Progress Note in the MD Initial Consult Encounter for this patient. 

## 2016-10-21 NOTE — Progress Notes (Signed)
Radiation Oncology         [phone]) [phone] ________________________________  Name: [name] MRN: [id number]  Date: 10/21/2016  DOB: [birth year]  WE:Gloria Smith,Gloria A., MD  Truitt Merle, MD     REFERRING PHYSICIAN: Truitt Merle, MD   DIAGNOSIS: The encounter diagnosis was Malignant neoplasm of upper-outer quadrant of left breast in female, estrogen receptor positive ([place]).  Stage IIA (pT1b, pN1a, Mx) grade 1 IDC and low grade DCIS of the left breast (ER/PR+, HER2-)  HISTORY OF PRESENT ILLNESS: [name] is a 45 y.o. female originally seen on 09/15/16 in the multidisciplinary breast clinic for a new diagnosis of right breast cancer. She was seen for a screening mammogram which revealed a cystic change in the right breast and a 1 cm mass in the left breast. Diagnostic imaging including ultrasound that confirmed a 1 cm mass in the left breast and one area of adenopathy in the left axilla. She underwent biopsy on 09/06/16 which revealed a grade 2 invasive ductal carcinoma. The left axillary node seen on ultrasound was also biopsied and confirmed invasive ductal carcinoma with extracapsular extension. Hormone receptors were notable for ER/PR positivity, and HER2 negative, Ki67 was 5% on the breast specimen.  The patient attended genetic counseling on 09/16/16 and consented to testing. Results were negative for deleterious mutations, and she did have a variant of undetermined significance. She subsequently went on to surgery on 10/05/16 where she had a left breast lumpectomy with seed guided node evaluation which revealed an grade 1 invasive and low grade in situ ductal carcinoma measuring 1 cm with negative margins. Of the two lymph nodes, both contained metastatic disease with extracapsular extension. Her Mammaprint score was low risk and she comes today to discuss the role of radiotherapy. Of note, the patient has been on Tamoxifen since 09/15/16, and will follow-up with Dr. Burr Medico next  week.  PREVIOUS RADIATION THERAPY: No   PAST MEDICAL HISTORY:  Past Medical History:  Diagnosis Date  . Headache    ocular migraines - very rare  . Malignant neoplasm of upper-outer quadrant of right female breast ([place]) 09/10/2016   pt states that cancer is on left breast, not right  . Urinary frequency        PAST SURGICAL HISTORY: Past Surgical History:  Procedure Laterality Date  . BREAST LUMPECTOMY WITH RADIOACTIVE SEED AND SENTINEL LYMPH NODE BIOPSY Left 10/05/2016   Procedure: LEFT BREAST RADIOACTIVE SEED GUIDED LUMPECTOMY, LEFT AXILLARY RADIOACTIVE SEED GUIDED NODE EXCISION, LEFT AXILLARY SENTINEL LYMPH NODE BIOPSY(LEFT TARGETED AXILLARY DISSECTION);  Surgeon: Rolm Bookbinder, MD;  Location: [facility];  Service: General;  Laterality: Left;  . DILATION AND CURETTAGE OF UTERUS    . right knee surgery at age of 12        FAMILY HISTORY:  Family History  Problem Relation Age of Onset  . Colon cancer Paternal Grandmother 95    d. 50  . Breast cancer Cousin     paternal 1st cousin, once-removed; dx. 39s  . Other Mother     hx precancerous skin finding removed from eyelid at 70-71  . Heart attack Maternal Grandmother 70  . Stroke Maternal Grandfather   . Heart attack Paternal Grandfather 24  . Other Other 25    hx of precancerous finding on abnormal pap smear  . Cancer Other     maternal great aunt (MGM's sister) d. early 22s; spinal cancer, but this may have been a secondary cancer w/ NOS primary  . Cancer Other  maternal great grandmother (MGM's mother) d. 10s w/ NOS cancer  . Cancer Other     paternal great uncle (PGM's brother) d. 71s w/ NOS cancer     SOCIAL HISTORY:  reports that she quit smoking about 1 months ago. She has a 15.00 pack-year smoking history. She has never used smokeless tobacco. She reports that she drinks about 0.6 oz of alcohol per week . She reports that she does not use drugs. The patient is in a relationship and is originally from [place].  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   ALLERGIES: Patient has no known allergies.   MEDICATIONS:  Current Outpatient Prescriptions  Medication Sig Dispense Refill  . ibuprofen (ADVIL,MOTRIN) 200 MG tablet Take 200 mg by mouth every 8 (eight) hours as needed.    Marland Kitchen oxyCODONE-acetaminophen (PERCOCET) 10-325 MG tablet Take 1 tablet by mouth every 6 (six) hours as needed for pain. (Patient not taking: Reported on 10/21/2016) 20 tablet 0  . tamoxifen (NOLVADEX) 20 MG tablet Take 20 mg by mouth daily.  0   No current facility-administered medications for this encounter.      REVIEW OF SYSTEMS: On review of systems, the patient reports that she is doing well overall. She denies any chest pain, shortness of breath, cough, fevers, chills, night sweats, unintended weight changes. She denies any bowel or bladder disturbances, and denies abdominal pain, nausea or vomiting. She reports pain underneath her left axilla and left upper back numbness, in addition to some restriction of her range of motion with abduction. A complete review of systems is obtained and is otherwise negative.     PHYSICAL EXAM:  Wt Readings from Last 3 Encounters:  10/21/16 169 lb 9.6 oz (76.9 kg)  10/05/16 164 lb (74.4 kg)  10/01/16 164 lb 3.2 oz (74.5 kg)   Temp Readings from Last 3 Encounters:  10/21/16 98.3 F (36.8 C) (Oral)  10/05/16 97.6 F (36.4 C)  10/01/16 98.6 F (37 C)   BP Readings from Last 3 Encounters:  10/21/16 112/61  10/05/16 112/66  09/15/16 107/62   Pulse Readings from Last 3 Encounters:  10/21/16 75  10/05/16 62  10/01/16 84     Pain scale 3/10 In general this is a well appearing Caucasian female in no acute distress. She's alert and oriented x4 and appropriate throughout the examination. Cardiopulmonary assessment is negative for acute distress and she exhibits normal effort.The left breast is examined, and her Steri-Strips along the upper outer quadrant. Minimal  induration is noted without evidence of cellulitic streaking bleeding or drainage. No evidence of fluctuance per se is noted    ECOG = 0  0 - Asymptomatic (Fully active, able to carry on all predisease activities without restriction)  1 - Symptomatic but completely ambulatory (Restricted in physically strenuous activity but ambulatory and able to carry out work of a light or sedentary nature. For example, light housework, office work)  2 - Symptomatic, <50% in bed during the day (Ambulatory and capable of all self care but unable to carry out any work activities. Up and about more than 50% of waking hours)  3 - Symptomatic, >50% in bed, but not bedbound (Capable of only limited self-care, confined to bed or chair 50% or more of waking hours)  4 - Bedbound (Completely disabled. Cannot carry on any self-care. Totally confined to bed or chair)  5 - Death   Santiago Glad MM, Creech RH, Tormey DC, et al. [phone]). "Toxicity and response criteria  of the [facility]". [place] Oncol. 5 (6): 649-55    LABORATORY DATA:  Lab Results  Component Value Date   WBC 10.8 (H) 10/01/2016   HGB 13.5 10/01/2016   HCT 41.7 10/01/2016   MCV 89.9 10/01/2016   PLT 464 (H) 10/01/2016   Lab Results  Component Value Date   NA 139 10/01/2016   K 4.2 10/01/2016   CL 106 10/01/2016   CO2 27 10/01/2016   Lab Results  Component Value Date   ALT 37 09/15/2016   AST 22 09/15/2016   ALKPHOS 122 09/15/2016   BILITOT 0.26 09/15/2016      RADIOGRAPHY: No results found.     IMPRESSION/PLAN: 1. Stage IIA, pT1bN1aMx, ER/PR positive invasive ductal carcinoma of the left breast. Dr. Lisbeth Renshaw has reviewed the patient's pathology and discussed the role for external radiotherapy to the breast followed by antiestrogen therapy. We discussed the risks, benefits, short, and long term effects of radiotherapy, and the patient is interested in proceeding. Dr. Lisbeth Renshaw discussed the delivery and logistics of  radiotherapy and recommends a course of 6 1/2 weeks of radiotherapy to the left breast, supraclavicular region, and axillary nodes. We will plan her simulation on 10/27/16, and she has signed written consent to move forward. 2. Possible genetic predisposition to malignancy. The patient has undergone genetic tests for known pathogenic mutation of 20 genes on the Breast/Ovarian Cancer Panel through [facility]. No mutations were identified, though one variant of undetermined significance was found in one copy of the BRIP1 gene. This will be followed by genetics. 3. Decreased range of motion left upper extremity. Patient will be following up with general surgery next week, also put a referral for her to meet with physical therapy to help with exercises to use during radiation treatment as continue to maintain her flexibility range of motion.  In a visit lasting 30 minutes, greater than 50% of the time was spent face to face discussing her pathology and plans for her treatment, and coordinating the patient's care.   The above documentation reflects my direct findings during this shared patient visit. Please see the separate note by Dr. Lisbeth Renshaw on this date for the remainder of the patient's plan of care.    Carola Rhine, PAC  This document serves as a record of services personally performed by Shona Simpson, PA-C and Kyung Rudd, MD. It was created on their behalf by Darcus Austin, a trained medical scribe. The creation of this record is based on the scribe's personal observations and the providers' statements to them. This document has been checked and approved by the attending provider.

## 2016-10-24 NOTE — Progress Notes (Signed)
[place]  [phone] [phone]  Clinic Follow Up Note   Patient Care Team: Aloha Gell, MD as PCP - General (Obstetrics and Gynecology) Kyung Rudd, MD as Consulting Physician (Radiation Oncology) Truitt Merle, MD as Consulting Physician (Hematology) Rolm Bookbinder, MD as Consulting Physician (General Surgery) 10/25/2016  CHIEF COMPLAINTS:  Follow up left breast cancer  Oncology History   Breast cancer of upper-outer quadrant of left female breast [facility])   Staging form: Breast, AJCC 7th Edition   - Clinical stage from 09/06/2016: Stage IIA (T1b, N1, M0) - Signed by Truitt Merle, MD on 09/15/2016   - Pathologic stage from 10/05/2016: Stage IIA (T1b, N1a, cM0) - Signed by Truitt Merle, MD on 10/24/2016      Breast cancer of upper-outer quadrant of left female breast ([place])   09/06/2016 Initial Diagnosis    Malignant neoplasm of upper-outer quadrant of right female breast ([place])      09/06/2016 Initial Biopsy    Left breast 3:00 position showed invasive ductal carcinoma and DCIS, left axillary lymph node biopsy showed metastatic carcinoma with extracapsular extension      09/06/2016 Receptors her2    Breast tumor ER 100% positive, PR 100% positive, HER-2 negative, Ki-67 5%      09/06/2016 Mammogram    Diagnostic mammogram and ultrasound showed a 1.0 cm mass at the 3 clock position of left breast, and the enlarged left axillary node with cortical thickening. The 52m cm oval cyst in the right breast is likely benign.      09/06/2016 Miscellaneous    Mammaprint showed low risk luminal A type, with average 10 year risk of recurrence untreated 10%.      10/05/2016 Surgery    Left breast lumpectomy and SLN biopsy       10/05/2016 Pathology Results    Left breast lumpectomy showed invasive and in situ ductal carcinoma, 1 cm, grade 1, margins are negative, 2 sentinel lymph nodes are positive for metastatic carcinoma with extracapsular extension.  LVI (+)       HISTORY OF PRESENTING ILLNESS:  Gloria HOWTON45y.o. female is here because of her recently diagnosed left breast cancer. She presents to our multidisciplinary breast clinic with her husband.    She had screening mammogram on 09/01/2016 at her gynecologist office, which showed a bilateral breast nodules. Her previous mammogram was 4 years ago which was normal. She denies any significant symptoms, except occasional mild left breast shooting pain for the past few months, no palpable mass, skin change or nipple discharge. She feels well overall. She gained about 20lbs in the past 6 months   [facility] showed a benign 846mcyst in the right breast, and a 1 cm mass in the 3:00 position of left breast, and I enlarged left axillary node. Both the left breast mass and axillary node biopsy showed invasive ductal carcinoma, ER/PR strongly positive, HER-2 negative.  GYN HISTORY  Menarchal: 12 LMP: spotting 08/2016, she has IUD (L[place]acie AcresContraceptive:  HRT: n/a G4P1: she has 1922o son   CURRENT THERAPY: Pending adjuvant breast radiation  INTERIM HISTORY:  Had surgery on 11/14, still has shooting pain in left breast, tolerable No edema of breast or arm, no limited ROM of left shoulder  Appetite and energy recovered well  She started Tamoxifen before surgery, tolerated well overall, and has been off since surgery She has had intermittent right hip pain for the past 6-8  months, no pain in the past month. No other pain or discomfort   MEDICAL HISTORY:  Past Medical History:  Diagnosis Date  . Headache    ocular migraines - very rare  . Malignant neoplasm of upper-outer quadrant of right female breast ([place]) 09/10/2016   pt states that cancer is on left breast, not right  . Urinary frequency     SURGICAL HISTORY: Past Surgical History:  Procedure Laterality Date  . BREAST LUMPECTOMY WITH RADIOACTIVE SEED AND  SENTINEL LYMPH NODE BIOPSY Left 10/05/2016   Procedure: LEFT BREAST RADIOACTIVE SEED GUIDED LUMPECTOMY, LEFT AXILLARY RADIOACTIVE SEED GUIDED NODE EXCISION, LEFT AXILLARY SENTINEL LYMPH NODE BIOPSY(LEFT TARGETED AXILLARY DISSECTION);  Surgeon: Rolm Bookbinder, MD;  Location: [place];  Service: General;  Laterality: Left;  . DILATION AND CURETTAGE OF UTERUS    . right knee surgery at age of 40       SOCIAL HISTORY: Social History   Social History  . Marital status: Single    Spouse name: N/A  . Number of children: N/A  . Years of education: N/A   Occupational History  . Not on file.   Social History Main Topics  . Smoking status: Former Smoker    Packs/day: 0.50    Years: 30.00    Quit date: 08/22/2016  . Smokeless tobacco: Never Used  . Alcohol use 0.6 oz/week    1 Glasses of wine per week     Comment: 6 drinks wine or beer a week   . Drug use: No  . Sexual activity: Not on file   Other Topics Concern  . Not on file   Social History Narrative  . No narrative on file   She works as a Regulatory affairs officer, works from home   FAMILY HISTORY: Family History  Problem Relation Age of Onset  . Colon cancer Paternal Grandmother 95    d. 80  . Breast cancer Cousin     paternal 1st cousin, once-removed; dx. 28s  . Other Mother     hx precancerous skin finding removed from eyelid at 70-71  . Heart attack Maternal Grandmother 70  . Stroke Maternal Grandfather   . Heart attack Paternal Grandfather 47  . Other Other 25    hx of precancerous finding on abnormal pap smear  . Cancer Other     maternal great aunt (MGM's sister) d. early 78s; spinal cancer, but this may have been a secondary cancer w/ NOS primary  . Cancer Other     maternal great grandmother (MGM's mother) d. 44s w/ NOS cancer  . Cancer Other     paternal great uncle (PGM's brother) d. 62s w/ NOS cancer    ALLERGIES:  has No Known Allergies.  MEDICATIONS:  Current Outpatient Prescriptions  Medication Sig  Dispense Refill  . ibuprofen (ADVIL,MOTRIN) 200 MG tablet Take 200 mg by mouth every 8 (eight) hours as needed.    Marland Kitchen oxyCODONE-acetaminophen (PERCOCET) 10-325 MG tablet Take 1 tablet by mouth every 6 (six) hours as needed for pain. 20 tablet 0  . tamoxifen (NOLVADEX) 20 MG tablet Take 20 mg by mouth daily.  0   No current facility-administered medications for this visit.     REVIEW OF SYSTEMS:   Constitutional: Denies fevers, chills or abnormal night sweats Eyes: Denies blurriness of vision, double vision or watery eyes Ears, nose, mouth, throat, and face: Denies mucositis or sore throat Respiratory: Denies cough, dyspnea or wheezes Cardiovascular: Denies palpitation, chest discomfort or lower extremity swelling Gastrointestinal:  Denies nausea, heartburn or change in bowel habits Skin: Denies abnormal skin rashes Lymphatics: Denies new lymphadenopathy or easy bruising Neurological:Denies numbness, tingling or new weaknesses Behavioral/Psych: Mood is stable, no new changes  All other systems were reviewed with the patient and are negative.  PHYSICAL EXAMINATION: ECOG PERFORMANCE STATUS: 0 - Asymptomatic  Vitals:   10/25/16 1035  BP: (!) 109/57  Pulse: 98  Resp: 18  Temp: 97.8 F (36.6 C)   Filed Weights   10/25/16 1035  Weight: 171 lb 3.2 oz (77.7 kg)    GENERAL:alert, no distress and comfortable SKIN: skin color, texture, turgor are normal, no rashes or significant lesions EYES: normal, conjunctiva are pink and non-injected, sclera clear OROPHARYNX:no exudate, no erythema and lips, buccal mucosa, and tongue normal  NECK: supple, thyroid normal size, non-tender, without nodularity LYMPH:  no palpable lymphadenopathy in the cervical, axillary or inguinal LUNGS: clear to auscultation and percussion with normal breathing effort HEART: regular rate & rhythm and no murmurs and no lower extremity edema ABDOMEN:abdomen soft, non-tender and normal bowel sounds Musculoskeletal:no  cyanosis of digits and no clubbing  PSYCH: alert & oriented x 3 with fluent speech NEURO: no focal motor/sensory deficits Breasts: Breast inspection showed them to be symmetrical with no nipple discharge. The surgical incision in the left breast has healed well. Palpation of the breasts and axilla revealed no obvious mass that I could appreciate.  LABORATORY DATA:  I have reviewed the data as listed CBC Latest Ref Rng & Units 10/01/2016 09/15/2016  WBC 4.0 - 10.5 K/uL 10.8(H) 14.7(H)  Hemoglobin 12.0 - 15.0 g/dL 13.5 13.8  Hematocrit 36.0 - 46.0 % 41.7 42.5  Platelets 150 - 400 K/uL 464(H) 448(H)   CMP Latest Ref Rng & Units 10/01/2016 09/15/2016  Glucose 65 - 99 mg/dL 92 126  BUN 6 - 20 mg/dL 14 14.9  Creatinine 0.44 - 1.00 mg/dL 0.93 0.8  Sodium 135 - 145 mmol/L 139 140  Potassium 3.5 - 5.1 mmol/L 4.2 4.2  Chloride 101 - 111 mmol/L 106 -  CO2 22 - 32 mmol/L 27 26  Calcium 8.9 - 10.3 mg/dL 9.2 9.1  Total Protein 6.4 - 8.3 g/dL - 7.3  Total Bilirubin 0.20 - 1.20 mg/dL - 0.26  Alkaline Phos 40 - 150 U/L - 122  AST 5 - 34 U/L - 22  ALT 0 - 55 U/L - 37     PATHOLOGY REPORT  Diagnosis 09/06/2016 1. Breast, left, needle core biopsy, 3 o'clock - INVASIVE DUCTAL CARCINOMA. - DUCTAL CARCINOMA IN SITU.  - SEE COMMENT. 2. Lymph node, axilla and upper limb, left - METASTATIC CARCINOMA IN 1 OF 1 LYMPH NODE (1/1), WITH EXTRACAPSULAR EXTENSION. Microscopic Comment 1. The carcinoma appears grade 2. A breast prognostic profile will be performed and the results reported separately. The results were called to [facility] on 09/07/16. (JBK:gt, 09/07/16) 1. PROGNOSTIC INDICATORS Results: IMMUNOHISTOCHEMICAL AND MORPHOMETRIC ANALYSIS PERFORMED MANUALLY Estrogen Receptor: 100%, POSITIVE, STRONG STAINING INTENSITY Progesterone Receptor: 100%, POSITIVE, STRONG STAINING INTENSITY Proliferation Marker Ki67: 5%  Results: HER2 - NEGATIVE RATIO OF HER2/CEP17 SIGNALS  1.46 AVERAGE HER2 COPY NUMBER PER CELL 3.00  Diagnosis 10/05/2016 1. Breast, lumpectomy, Left - INVASIVE AND IN SITU DUCTAL CARCINOMA, 1 CM. - MARGINS NOT INVOLVED. - FIBROCYSTIC CHANGES. 2. Lymph node, sentinel, biopsy, Left #1 - METASTATIC CARCINOMA IN ONE LYMPH NODE WITH EXTRACAPSULAR EXTENSION (1/1). 3. Lymph node, sentinel, biopsy, Left #2 - METASTATIC CARCINOMA IN ONE LYMPH NODE WITH EXTRACAPSULAR EXTENSION (1/1). Microscopic  Comment 1. BREAST, INVASIVE TUMOR, WITH LYMPH NODES PRESENT Specimen, including laterality and lymph node sampling (sentinel, non-sentinel): Left breast with two sentinel lymph nodes Procedure: Localized lumpectomy and sentinel lymph node biopsies. Histologic type: Ductal Grade: 1 Tubule formation: 1 Nuclear pleomorphism: 2 Mitotic: 1 Tumor size (gross measurement) 1 cm Margins: Free of tumor Invasive, distance to closest margin: 0.3 cm from anterior margin In-situ, distance to closest margin: 0.2 cm from anterior margin If margin positive, focally or broadly: N/A Lymphovascular invasion: Present Ductal carcinoma in situ: Present Grade: Low grade Extensive intraductal component: No Lobular neoplasia: No Tumor focality: Unifocal Extent of tumor: 1 of 3 FINAL for TAMIAH, DYSART ([id number]) Microscopic Comment(continued) Skin: N/A Nipple: N/A Skeletal muscle: N/A Lymph nodes: Examined: 2 Sentinel 0 Non-sentinel 2 Total Lymph nodes with metastasis: 2 Isolated tumor cells (< 0.2 mm): 0 Micrometastasis: (> 0.2 mm and < 2.0 mm): 0 Macrometastasis: (> 2.0 mm): 2 Extracapsular extension: Present, both nodes Breast prognostic profile: Case [id number] Estrogen receptor: 100% and 95%, positive Progesterone receptor: 100% and 100%, positive Her 2 neu: Negative, ratio is 1.46 and 1.45 Ki-67: 5% and 10% Non-neoplastic breast: Fibrocystic changes TNM: pT1b, pN1a, pMX (JDP:kh 10-06-16) Claudette Laws MD Pathologist, Electronic Signature (Case  signed 10/06/2016)   Mammaprint 09/06/2016      RADIOGRAPHIC STUDIES: I have personally reviewed the radiological images as listed and agreed with the findings in the report. No results found.  I have reviewed her outside mammogram and ultrasound results.  ASSESSMENT & PLAN:  45 year old pre-or perimenopausal woman, presented with screening discovered left breast mass and axilla adenopathy.  1. Breast cancer of upper-outer quadrant of left female breast, pT1bN1aM0 stage IIA, G1, ER+/PR+/HER2-, Ki67 5% ---I discussed her surgical pathology results with the patient in details. -She has had a complete surgical resections, margins were negative, she had 2 positive sentinel lymph nodes with extracapsular extension, (+) LVI which are risk for recurrence.  -We did mammaprint on her initial biopsy tumor tissue, which showed low risk disease, luminal type A. Her risk of recurrence in 10 years without adjuvant therapy is 10%. Her 5 year distant metastasis free survival is 97.8% with adjuvant antiestrogen therapy. Based on the mammaprint results, I do not recommend adjuvant chemotherapy. -Giving the strong ER and PR expression in her tumor and her pre or perimenopausal status, I recommend adjuvant endocrine therapy with tamoxifen for 10 years to reduce the risk of cancer recurrence. She started before surgery, and tolerated well. She has been off since her breast surgery. -We discussed the alternative endocrine therapy with aromatase inhibitor and over suppression, due to her young age, and stage II a node positive disease, I think she would benefit more from OS+AI then tamoxifen. The potential benefit and side effects of aromatase inhibitor, which includes but not limited to, hot flashes, vaginal and skin dryness, metabolic impact on her cholesterol, blood glucose, mood swing, weight gain, osteoporosis, etc., were discussed with her in details, she is interested. She agrees to start Zoladex in a few  weeks, and I'll start her aromatase inhibitor when she completes radiation.  -PALLAS clinical trial counseling: Patients who have completed definitive therapy for breast cancer are randomized to antiestrogen therapy (5+ years) versus antiestrogen therapy plus Palbociclib (2 years). I discussed this trial option with her and recommend her to consider, she is interested.  Palbociclib: If she was randomized to Palbociclib, I discussed the risks and benefits of Ibrance including myelosuppression especially neutropenia and with that risk of infection, there  is risk of pulmonary embolism and mild peripheral neuropathy as well. Fatigue, nausea, diarrhea, decreased appetite as well as alopecia and thrombocytopenia are also potential side effects of Palbociclib. -She is scheduled to start adjuvant breast radiation next week.  2. Genetics  -Giving her young age and positive family history of breast cancer, she'll be referred to genetic counselor for genetic testing, to ruled out inheritable breast cancer syndrome. -Negative for known pathogenic mutations within any of 20 genes on the Breast/Ovarian Cancer Panel through [employer].  One variant of uncertain significance (VUS) called "[id number].3571A>G (p.Ile1191Val)" was found in one copy of the BRIP1 gene.  The Breast/Ovarian Cancer Panel offered by [facility] Hope Pigeon, MD) includes sequencing and deletion/duplication analysis for the following 19 genes:  ATM, BARD1, BRCA1, BRCA2, BRIP1, CDH1, CHEK2, FANCC, MLH1, MSH2, MSH6, NBN, PALB2, PMS2, PTEN, RAD51C, RAD51D, TP53, and XRCC2.  This panel also includes deletion/duplication analysis (without sequencing) for one gene, EPCAM.   Plan -Mammaprint and surgical path results discussed with her -she agrees to start Zoladex next week, and continue every 4 weeks, if she tolerates well, she'll consider BSO, I will start her on AI when she completes RT  -she is also interested in PALLAS study  -she will  start adjuvant breast and axilla radiation next week -I will see her in the last week of her radiation     All questions were answered. The patient knows to call the clinic with any problems, questions or concerns. I spent 30 minutes counseling the patient face to face. The total time spent in the appointment was 40 minutes and more than 50% was on counseling.     Truitt Merle, MD 10/25/2016

## 2016-10-25 ENCOUNTER — Ambulatory Visit (HOSPITAL_BASED_OUTPATIENT_CLINIC_OR_DEPARTMENT_OTHER): Payer: Managed Care, Other (non HMO) | Admitting: Hematology

## 2016-10-25 ENCOUNTER — Encounter: Payer: Self-pay | Admitting: Hematology

## 2016-10-25 ENCOUNTER — Telehealth: Payer: Self-pay | Admitting: Hematology

## 2016-10-25 ENCOUNTER — Other Ambulatory Visit: Payer: Managed Care, Other (non HMO)

## 2016-10-25 DIAGNOSIS — Z170 Estrogen receptor positive status [ER+]: Secondary | ICD-10-CM

## 2016-10-25 DIAGNOSIS — C50412 Malignant neoplasm of upper-outer quadrant of left female breast: Secondary | ICD-10-CM

## 2016-10-25 DIAGNOSIS — Z1379 Encounter for other screening for genetic and chromosomal anomalies: Secondary | ICD-10-CM

## 2016-10-25 DIAGNOSIS — C773 Secondary and unspecified malignant neoplasm of axilla and upper limb lymph nodes: Secondary | ICD-10-CM

## 2016-10-25 NOTE — Telephone Encounter (Signed)
Appointments scheduled per 12/4 LOS. Patient given AVS report and calendars with future scheduled appointments.  °

## 2016-10-27 ENCOUNTER — Ambulatory Visit: Payer: Managed Care, Other (non HMO) | Attending: General Surgery | Admitting: Physical Therapy

## 2016-10-27 ENCOUNTER — Ambulatory Visit
Admission: RE | Admit: 2016-10-27 | Discharge: 2016-10-27 | Disposition: A | Discharge status: Home / Self Care | Payer: Managed Care, Other (non HMO) | Source: Ambulatory Visit | Attending: Radiation Oncology | Admitting: Radiation Oncology

## 2016-10-27 DIAGNOSIS — M25612 Stiffness of left shoulder, not elsewhere classified: Secondary | ICD-10-CM | POA: Diagnosis present

## 2016-10-27 DIAGNOSIS — Z170 Estrogen receptor positive status [ER+]: Principal | ICD-10-CM

## 2016-10-27 DIAGNOSIS — Z510 Encounter for antineoplastic radiation therapy: Secondary | ICD-10-CM | POA: Diagnosis not present

## 2016-10-27 DIAGNOSIS — Z483 Aftercare following surgery for neoplasm: Secondary | ICD-10-CM | POA: Diagnosis present

## 2016-10-27 DIAGNOSIS — C50412 Malignant neoplasm of upper-outer quadrant of left female breast: Secondary | ICD-10-CM

## 2016-10-27 NOTE — Therapy (Signed)
[place], [place], [address] Phone: [phone]   Fax:  [phone]  Physical Therapy Evaluation  Patient Details  Name: Gloria Smith MRN: [id number] Date of Birth: [birth year] Referring Provider: Shona Simpson, PA-C  Encounter Date: 10/27/2016      PT End of Session - 10/27/16 1152    Visit Number 1   Number of Visits 1   PT Start Time 1110   PT Stop Time 1150   PT Time Calculation (min) 40 min   Activity Tolerance Patient tolerated treatment well   Behavior During Therapy [facility] for tasks assessed/performed      Past Medical History:  Diagnosis Date  . Headache    ocular migraines - very rare  . Malignant neoplasm of upper-outer quadrant of right female breast ([place]) 09/10/2016   pt states that cancer is on left breast, not right  . Urinary frequency     Past Surgical History:  Procedure Laterality Date  . BREAST LUMPECTOMY WITH RADIOACTIVE SEED AND SENTINEL LYMPH NODE BIOPSY Left 10/05/2016   Procedure: LEFT BREAST RADIOACTIVE SEED GUIDED LUMPECTOMY, LEFT AXILLARY RADIOACTIVE SEED GUIDED NODE EXCISION, LEFT AXILLARY SENTINEL LYMPH NODE BIOPSY(LEFT TARGETED AXILLARY DISSECTION);  Surgeon: Rolm Bookbinder, MD;  Location: [place];  Service: General;  Laterality: Left;  . DILATION AND CURETTAGE OF UTERUS    . right knee surgery at age of 60       There were no vitals filed for this visit.       Subjective Assessment - 10/27/16 1115    Subjective Was scheduled for therapy; is here for that reason.  It was suggested I come to help with getting full movement back and be prepared for radiation.  She has recently quit smoking.   Pertinent History Patient was diagnosed on 09/01/16 with left invasive breast cancer.  It is ER/PR positive, HER2 negative, and has a Ki67 of 5%. It measures 1 cm and is located in the upper outer quadrant. She has a biopsied positive axillary lymph node.  She  subsequently went on to surgery on 10/05/16 where she had a left breast lumpectomy with seed guided node evaluation which revealed an grade 1 invasive and low grade in situ ductal carcinoma measuring 1 cm with negative margins. Of the two lymph nodes, both contained metastatic disease with extracapsular extension.  She expects to do XRT starting next week; has simulation this afternoon.   Patient Stated Goals to be able to do radiation; to be shown exercises to do at home   Currently in Pain? Yes   Pain Score 3    Pain Location Breast   Pain Orientation Left   Pain Descriptors / Indicators Other (Comment)  annoying; also sharp shooting pains   Pain Type Surgical pain   Aggravating Factors  no matter what you do, it just rubs all day long because of the location of the incision at lateral breast   Pain Relieving Factors find a different position            [facility] PT Assessment - 10/27/16 0001      Assessment   Medical Diagnosis Left breast cancer   Referring Provider Shona Simpson, PA-C   Onset Date/Surgical Date 10/05/16   Hand Dominance Right   Prior Therapy none     Precautions   Precautions Other (comment)   Precaution Comments cancer precautions     Restrictions   Weight Bearing Restrictions No     Balance Screen  Has the patient fallen in the past 6 months No   Has the patient had a decrease in activity level because of a fear of falling?  No   Is the patient reluctant to leave their home because of a fear of falling?  No     Home Environment   Living Environment Private residence   Living Arrangements Spouse/significant other  Lives with her boyfriend   Type of [place] One level     Prior Function   Level of Independence Independent   Vocation Full time employment   Vocation Requirements Desk work   Leisure She walks the dog every day, 5 mins. to 2 miles     Cognition   Overall Cognitive Status Within Functional Limits for tasks assessed      Observation/Other Assessments   Skin Integrity left lateral upper breast incision approx. 1.5 inches long, still covered with steri strips, but looks to be healing well and no redness in the area     Posture/Postural Control   Posture/Postural Control Postural limitations   Postural Limitations Rounded Shoulders     AROM   Right Shoulder Horizontal ABduction 27 Degrees   Left Shoulder Extension 43 Degrees   Left Shoulder Flexion 132 Degrees   Left Shoulder ABduction 114 Degrees   Left Shoulder Internal Rotation 77 Degrees  in sitting   Left Shoulder External Rotation 85 Degrees  in sitting   Left Shoulder Horizontal ABduction 14 Degrees     Ambulation/Gait   Ambulation/Gait Yes   Ambulation/Gait Assistance 7: Independent           LYMPHEDEMA/ONCOLOGY QUESTIONNAIRE - 10/27/16 1123      Right Upper Extremity Lymphedema   10 cm Proximal to Olecranon Process 29.5 cm   Olecranon Process 24.4 cm   10 cm Proximal to Ulnar Styloid Process 24.5 cm   Just Proximal to Ulnar Styloid Process 16.3 cm   Across Hand at [employer] 19 cm   At [place] of 2nd Digit 6.7 cm     Left Upper Extremity Lymphedema   10 cm Proximal to Olecranon Process 30.7 cm   Olecranon Process 24.2 cm   10 cm Proximal to Ulnar Styloid Process 23.9 cm   Just Proximal to Ulnar Styloid Process 16.3 cm   Across Hand at [employer] 18.6 cm   At [place] of 2nd Digit 6.4 cm   Other says she has gained over 10 lbs. since diagnosis                        PT Education - 10/27/16 1152    Education provided Yes   Education Details reviewed BMDC exercises and added dowel exercise for abduction (and possibly flexion); also about data showing decreased recurrence risk with 3+hours/week of exercise   Person(s) Educated Patient   Methods Explanation;Handout;Tactile cues;Verbal cues   Comprehension Verbalized understanding;Returned demonstration              Breast Clinic Goals - 09/15/16  2143      Patient will be able to verbalize understanding of pertinent lymphedema risk reduction practices relevant to her diagnosis specifically related to skin care.   Time 1   Period Days   Status Achieved     Patient will be able to return demonstrate and/or verbalize understanding of the post-op home exercise program related to regaining shoulder range of motion.   Time 1   Period Days  Status Achieved     Patient will be able to verbalize understanding of the importance of attending the postoperative After Breast Cancer Class for further lymphedema risk reduction education and therapeutic exercise.   Time 1   Period Days   Status Achieved          Long Term Clinic Goals - 10/27/16 1156      CC Long Term Goal  #1   Title Patient will be knowledgeable about home exercise program for left shoulder ROM.   Status Achieved     CC Long Term Goal  #2   Title patient will be aware of the benefit of attending ABC class   Baseline she plans to attend on 11/08/16   Status Achieved            Plan - 10/27/16 1153    Clinical Impression Statement Patient is about three weeks post lumpectomy with SLNB for breast cancer and expects to start XRT next week.  She is doing fairly well, though left shoulder AROM is decreased compared to pre-surgery measurements.  Post-op stretches were reviewed with her today and dowel exercises for abduction and flexion added.  Also encouraged her to come to ABC class, and she was already scheduled to come to the next one.   Rehab Potential Excellent   Clinical Impairments Affecting Rehab Potential None   PT Frequency One time visit   PT Treatment/Interventions ADLs/Self Care Home Management;Patient/family education;Therapeutic exercise   PT Next Visit Plan None at this time, other than for her to attend ABC class.   PT Home Exercise Plan post-op shoulder ROM with added dowel exercises   Consulted and Agree with Plan of Care Patient      Patient  will benefit from skilled therapeutic intervention in order to improve the following deficits and impairments:  Postural dysfunction, Decreased range of motion, Pain, Impaired UE functional use  Visit Diagnosis: Stiffness of left shoulder, not elsewhere classified - Plan: PT plan of care cert/re-cert  Aftercare following surgery for neoplasm - Plan: PT plan of care cert/re-cert     Problem List Patient Active Problem List   Diagnosis Date Noted  . Genetic testing 10/04/2016  . Breast cancer of upper-outer quadrant of left female breast ([place]) 09/10/2016    Ameris Akamine 10/27/2016, 12:00 PM  [place], [place], [address] Phone: [phone]   Fax:  [phone]  Name: Gloria Smith MRN: [id number] Date of Birth: 1971-10-29  Serafina Royals, PT 10/27/16 12:00 PM

## 2016-10-27 NOTE — Patient Instructions (Addendum)
Inferior Capsule Stick Stretch I    Stand or sit, dowel in palm of arm to be stretched. Other arm, holding dowel in front of body, pushes outward and upward until arm being stretched is as high to the side as possible. Hold __5_ seconds. Repeat _5__ times per session. Do _2-3__ sessions per day.  Copyright  VHI. All rights reserved.

## 2016-10-29 NOTE — Progress Notes (Signed)
  Radiation Oncology         [phone]) [phone] ________________________________  Name: Gloria Smith MRN: [id number]  Date: 10/27/2016  DOB: 1971-06-15  Diagnosis DIAGNOSIS:     ICD-9-CM ICD-10-CM   1. Malignant neoplasm of upper-outer quadrant of left breast in female, estrogen receptor positive ([place]) 174.4 C50.412    V86.0 Z17.0      SIMULATION AND TREATMENT PLANNING NOTE  The patient presented for simulation prior to beginning her course of radiation treatment for her diagnosis of left-sided breast cancer. The patient was placed in a supine position on a breast board. A customized vac-lock bag was also constructed and this complex treatment device will be used on a daily basis during her treatment. In this fashion, a CT scan was obtained through the chest area and an isocenter was placed near the chest wall at the upper aspect of the right chest. A breath-hold technique has also been evaluated to determine if this significantly improves the spatial relationship between the target region and the heart. Based on this analysis, a breath-hold technique has been ordered for the patient's treatment.  The patient will be planned to receive a course of radiation initially to a dose of 50.4 gray. This will consist of a 4 field technique targeting the left breast as well as the supraclavicular region. Therefore 2 customized medial and lateral tangent fields have been created targeting the chest wall, and also 2 additional customized fields have been designed to treat the supraclavicular region both with a left supraclavicular field and a left posterior axillary boost field. A forward planning/reduced field technique will also be evaluated to determine if this significantly improves the dose homogeneity of the overall plan. Therefore, additional customized blocks/fields may be necessary.  This initial treatment will be accomplished at 1.8 gray per fraction.   The initial plan will consist of a 3-D conformal  technique. The target volume/scar, heart and lungs have been contoured and dose volume histograms of each of these structures will be evaluated as part of the 3-D conformal treatment planning process.   It is anticipated that the patient will then receive a 10 gray boost to the surgical scar. This will be accomplished at 2 gray per fraction. The final anticipated total dose therefore will correspond to 60.4 gray.    _______________________________   Jodelle Gross, MD, PhD

## 2016-10-29 NOTE — Progress Notes (Signed)
  Radiation Oncology         [phone]) [phone] ________________________________  Name: Gloria Smith MRN: [id number]  Date: 10/27/2016  DOB: 03/23/1971  Optical Surface Tracking Plan:  Since intensity modulated radiotherapy (IMRT) and 3D conformal radiation treatment methods are predicated on accurate and precise positioning for treatment, intrafraction motion monitoring is medically necessary to ensure accurate and safe treatment delivery.  The ability to quantify intrafraction motion without excessive ionizing radiation dose can only be performed with optical surface tracking. Accordingly, surface imaging offers the opportunity to obtain 3D measurements of patient position throughout IMRT and 3D treatments without excessive radiation exposure.  I am ordering optical surface tracking for this patient's upcoming course of radiotherapy. ________________________________  Kyung Rudd, MD 10/29/2016 8:47 PM    Reference:   Ursula Alert, J, et al. Surface imaging-based analysis of intrafraction motion for breast radiotherapy patients.Journal of [place], n. 6, nov. 2014. ISSN [id number].   Available at: <http://www.jacmp.org/index.php/jacmp/article/view/4957>.

## 2016-11-01 ENCOUNTER — Ambulatory Visit (HOSPITAL_BASED_OUTPATIENT_CLINIC_OR_DEPARTMENT_OTHER): Payer: Managed Care, Other (non HMO)

## 2016-11-01 VITALS — BP 119/71 | HR 77 | Temp 97.4°F | Resp 18

## 2016-11-01 DIAGNOSIS — C773 Secondary and unspecified malignant neoplasm of axilla and upper limb lymph nodes: Secondary | ICD-10-CM

## 2016-11-01 DIAGNOSIS — Z5111 Encounter for antineoplastic chemotherapy: Secondary | ICD-10-CM

## 2016-11-01 DIAGNOSIS — C50412 Malignant neoplasm of upper-outer quadrant of left female breast: Secondary | ICD-10-CM

## 2016-11-01 DIAGNOSIS — Z170 Estrogen receptor positive status [ER+]: Principal | ICD-10-CM

## 2016-11-01 MED ORDER — GOSERELIN ACETATE 3.6 MG ~~LOC~~ IMPL
3.6000 mg | DRUG_IMPLANT | Freq: Once | SUBCUTANEOUS | Status: AC
Start: 2016-11-01 — End: 2016-11-01
  Administered 2016-11-01: 3.6 mg via SUBCUTANEOUS
  Filled 2016-11-01: qty 3.6

## 2016-11-01 NOTE — Patient Instructions (Signed)
Goserelin injection What is this medicine? GOSERELIN (GOE se rel in) is similar to a hormone found in the body. It lowers the amount of sex hormones that the body makes. Men will have lower testosterone levels and women will have lower estrogen levels while taking this medicine. In men, this medicine is used to treat prostate cancer; the injection is either given once per month or once every 12 weeks. A once per month injection (only) is used to treat women with endometriosis, dysfunctional uterine bleeding, or advanced breast cancer. This medicine may be used for other purposes; ask your health care provider or pharmacist if you have questions. COMMON BRAND NAME(S): Zoladex What should I tell my health care provider before I take this medicine? They need to know if you have any of these conditions (some only apply to women): -diabetes -heart disease or previous heart attack -high blood pressure -high cholesterol -kidney disease -osteoporosis or low bone density -problems passing urine -spinal cord injury -stroke -tobacco smoker -an unusual or allergic reaction to goserelin, hormone therapy, other medicines, foods, dyes, or preservatives -pregnant or trying to get pregnant -breast-feeding How should I use this medicine? This medicine is for injection under the skin. It is given by a health care professional in a hospital or clinic setting. Men receive this injection once every 4 weeks or once every 12 weeks. Women will only receive the once every 4 weeks injection. Talk to your pediatrician regarding the use of this medicine in children. Special care may be needed. Overdosage: If you think you have taken too much of this medicine contact a poison control center or emergency room at once. NOTE: This medicine is only for you. Do not share this medicine with others. What if I miss a dose? It is important not to miss your dose. Call your doctor or health care professional if you are unable to  keep an appointment. What may interact with this medicine? -female hormones like estrogen -herbal or dietary supplements like black cohosh, chasteberry, or DHEA -female hormones like testosterone -prasterone This list may not describe all possible interactions. Give your health care provider a list of all the medicines, herbs, non-prescription drugs, or dietary supplements you use. Also tell them if you smoke, drink alcohol, or use illegal drugs. Some items may interact with your medicine. What should I watch for while using this medicine? Visit your doctor or health care professional for regular checks on your progress. Your symptoms may appear to get worse during the first weeks of this therapy. Tell your doctor or healthcare professional if your symptoms do not start to get better or if they get worse after this time. Your bones may get weaker if you take this medicine for a long time. If you smoke or frequently drink alcohol you may increase your risk of bone loss. A family history of osteoporosis, chronic use of drugs for seizures (convulsions), or corticosteroids can also increase your risk of bone loss. Talk to your doctor about how to keep your bones strong. This medicine should stop regular monthly menstration in women. Tell your doctor if you continue to menstrate. Women should not become pregnant while taking this medicine or for 12 weeks after stopping this medicine. Women should inform their doctor if they wish to become pregnant or think they might be pregnant. There is a potential for serious side effects to an unborn child. Talk to your health care professional or pharmacist for more information. Do not breast-feed an infant while taking   this medicine. Men should inform their doctors if they wish to father a child. This medicine may lower sperm counts. Talk to your health care professional or pharmacist for more information. What side effects may I notice from receiving this  medicine? Side effects that you should report to your doctor or health care professional as soon as possible: -allergic reactions like skin rash, itching or hives, swelling of the face, lips, or tongue -bone pain -breathing problems -changes in vision -chest pain -feeling faint or lightheaded, falls -fever, chills -pain, swelling, warmth in the leg -pain, tingling, numbness in the hands or feet -signs and symptoms of low blood pressure like dizziness; feeling faint or lightheaded, falls; unusually weak or tired -stomach pain -swelling of the ankles, feet, hands -trouble passing urine or change in the amount of urine -unusually high or low blood pressure -unusually weak or tired Side effects that usually do not require medical attention (report to your doctor or health care professional if they continue or are bothersome): -change in sex drive or performance -changes in breast size in both males and females -changes in emotions or moods -headache -hot flashes -irritation at site where injected -loss of appetite -skin problems like acne, dry skin -vaginal dryness This list may not describe all possible side effects. Call your doctor for medical advice about side effects. You may report side effects to FDA at 1-800-FDA-1088. Where should I keep my medicine? This drug is given in a hospital or clinic and will not be stored at home. NOTE: This sheet is a summary. It may not cover all possible information. If you have questions about this medicine, talk to your doctor, pharmacist, or health care provider.  2017 Elsevier/Gold Standard (2014-01-15 11:10:35)  

## 2016-11-02 DIAGNOSIS — Z510 Encounter for antineoplastic radiation therapy: Secondary | ICD-10-CM | POA: Diagnosis not present

## 2016-11-04 ENCOUNTER — Ambulatory Visit: Payer: Managed Care, Other (non HMO)

## 2016-11-04 ENCOUNTER — Ambulatory Visit
Admission: RE | Admit: 2016-11-04 | Discharge: 2016-11-04 | Disposition: A | Discharge status: Home / Self Care | Payer: Managed Care, Other (non HMO) | Source: Ambulatory Visit | Attending: Radiation Oncology | Admitting: Radiation Oncology

## 2016-11-04 DIAGNOSIS — Z510 Encounter for antineoplastic radiation therapy: Secondary | ICD-10-CM | POA: Diagnosis not present

## 2016-11-08 ENCOUNTER — Ambulatory Visit
Admission: RE | Admit: 2016-11-08 | Discharge: 2016-11-08 | Disposition: A | Discharge status: Home / Self Care | Payer: Managed Care, Other (non HMO) | Source: Ambulatory Visit | Attending: Radiation Oncology | Admitting: Radiation Oncology

## 2016-11-08 ENCOUNTER — Other Ambulatory Visit: Payer: Self-pay | Admitting: *Deleted

## 2016-11-08 DIAGNOSIS — Z171 Estrogen receptor negative status [ER-]: Principal | ICD-10-CM

## 2016-11-08 DIAGNOSIS — Z510 Encounter for antineoplastic radiation therapy: Secondary | ICD-10-CM | POA: Diagnosis not present

## 2016-11-08 DIAGNOSIS — C50412 Malignant neoplasm of upper-outer quadrant of left female breast: Secondary | ICD-10-CM

## 2016-11-08 MED ORDER — ALRA NON-METALLIC DEODORANT (RAD-ONC)
1.0000 "application " | Freq: Once | TOPICAL | Status: AC
  Administered 2016-11-08: 1 via TOPICAL

## 2016-11-08 MED ORDER — RADIAPLEXRX EX GEL
Freq: Once | CUTANEOUS | Status: AC
  Administered 2016-11-08: 14:00:00 via TOPICAL

## 2016-11-08 NOTE — Progress Notes (Signed)
Pt education done,  Radiation therapy and you  Book, my business card,  radiaplex and alra given to patient, , discussed side effects skin irritation , fatigue, pain, increase protein in diet, electric shaver if used, no under wire bras,use skin products after rad tx and bedtimes daily, teach back given 2:24 PM

## 2016-11-09 ENCOUNTER — Ambulatory Visit
Admission: RE | Admit: 2016-11-09 | Discharge: 2016-11-09 | Disposition: A | Discharge status: Home / Self Care | Payer: Managed Care, Other (non HMO) | Source: Ambulatory Visit | Attending: Radiation Oncology | Admitting: Radiation Oncology

## 2016-11-09 DIAGNOSIS — Z510 Encounter for antineoplastic radiation therapy: Secondary | ICD-10-CM | POA: Diagnosis not present

## 2016-11-10 ENCOUNTER — Ambulatory Visit
Admission: RE | Admit: 2016-11-10 | Discharge: 2016-11-10 | Disposition: A | Discharge status: Home / Self Care | Payer: Managed Care, Other (non HMO) | Source: Ambulatory Visit | Attending: Radiation Oncology | Admitting: Radiation Oncology

## 2016-11-10 DIAGNOSIS — Z510 Encounter for antineoplastic radiation therapy: Secondary | ICD-10-CM | POA: Diagnosis not present

## 2016-11-11 ENCOUNTER — Ambulatory Visit
Admission: RE | Admit: 2016-11-11 | Discharge: 2016-11-11 | Disposition: A | Discharge status: Home / Self Care | Payer: Managed Care, Other (non HMO) | Source: Ambulatory Visit | Attending: Radiation Oncology | Admitting: Radiation Oncology

## 2016-11-11 DIAGNOSIS — Z510 Encounter for antineoplastic radiation therapy: Secondary | ICD-10-CM | POA: Diagnosis not present

## 2016-11-12 ENCOUNTER — Ambulatory Visit
Admission: RE | Admit: 2016-11-12 | Discharge: 2016-11-12 | Disposition: A | Discharge status: Home / Self Care | Payer: Managed Care, Other (non HMO) | Source: Ambulatory Visit | Attending: Radiation Oncology | Admitting: Radiation Oncology

## 2016-11-12 ENCOUNTER — Encounter: Payer: Self-pay | Admitting: Radiation Oncology

## 2016-11-12 VITALS — BP 107/62 | HR 79 | Temp 98.2°F | Resp 18 | Ht 64.0 in | Wt 169.8 lb

## 2016-11-12 DIAGNOSIS — Z171 Estrogen receptor negative status [ER-]: Principal | ICD-10-CM

## 2016-11-12 DIAGNOSIS — Z510 Encounter for antineoplastic radiation therapy: Secondary | ICD-10-CM | POA: Diagnosis not present

## 2016-11-12 DIAGNOSIS — C50412 Malignant neoplasm of upper-outer quadrant of left female breast: Secondary | ICD-10-CM

## 2016-11-12 NOTE — Progress Notes (Signed)
Gloria Smith has received 5 radiation treatments to her left breast.  Using Radiaplex gel bid, skin normal having sharp pains at times to her left breast that do not last very long.  Appetite is good.  Having fatigue more than usual in the afternoon taking a nap. Wt Readings from Last 3 Encounters:  11/12/16 169 lb 12.8 oz (77 kg)  10/25/16 171 lb 3.2 oz (77.7 kg)  10/21/16 169 lb 9.6 oz (76.9 kg)  BP 107/62   Pulse 79   Temp 98.2 F (36.8 C)   Resp 18   Ht 5\' 4"  (1.626 m)   Wt 169 lb 12.8 oz (77 kg)   BMI 29.15 kg/m

## 2016-11-12 NOTE — Progress Notes (Signed)
   Department of Radiation Oncology  Phone:  [phone] Fax:        [phone]  Weekly Treatment Note    Name: Gloria Smith Date: 11/12/2016 MRN: [id number] DOB: Nov 05, 1971   Diagnosis:     ICD-9-CM ICD-10-CM   1. Malignant neoplasm of upper-outer quadrant of left breast in female, estrogen receptor negative ([facility]) 174.4 C50.412    V86.1 Z17.1      Current dose: 9 Gy  Current fraction: 5   MEDICATIONS: Current Outpatient Prescriptions  Medication Sig Dispense Refill  . hyaluronate sodium (RADIAPLEXRX) GEL Apply 1 application topically 2 (two) times daily.    Marland Kitchen ibuprofen (ADVIL,MOTRIN) 200 MG tablet Take 200 mg by mouth every 8 (eight) hours as needed.    . non-metallic deodorant Jethro Poling) MISC Apply 1 application topically daily as needed.    Marland Kitchen oxyCODONE-acetaminophen (PERCOCET) 10-325 MG tablet Take 1 tablet by mouth every 6 (six) hours as needed for pain. (Patient not taking: Reported on 11/12/2016) 20 tablet 0  . tamoxifen (NOLVADEX) 20 MG tablet Take 20 mg by mouth daily.  0   No current facility-administered medications for this encounter.      ALLERGIES: Patient has no known allergies.   LABORATORY DATA:  Lab Results  Component Value Date   WBC 10.8 (H) 10/01/2016   HGB 13.5 10/01/2016   HCT 41.7 10/01/2016   MCV 89.9 10/01/2016   PLT 464 (H) 10/01/2016   Lab Results  Component Value Date   NA 139 10/01/2016   K 4.2 10/01/2016   CL 106 10/01/2016   CO2 27 10/01/2016   Lab Results  Component Value Date   ALT 37 09/15/2016   AST 22 09/15/2016   ALKPHOS 122 09/15/2016   BILITOT 0.26 09/15/2016     NARRATIVE: Gloria Smith was seen today for weekly treatment management. The chart was checked and the patient's films were reviewed.  The patient has completed 5 fractions. She reports fatigue more than usual, and takes naps in the afternoon. She reports having sharp pains to her left breast at times, though it resolves on its own quickly. She  is using Radiaplex gel bid as directed. Her appetite is good.   PHYSICAL EXAMINATION: height is 5\' 4"  (1.626 m) and weight is 169 lb 12.8 oz (77 kg). Her temperature is 98.2 F (36.8 C). Her blood pressure is 107/62 and her pulse is 79. Her respiration is 18.  The patient is well appearing.  ASSESSMENT: The patient is doing satisfactorily with treatment.  PLAN: We will continue with the patient's radiation treatment as planned.   ------------------------------------------------   Tyler Pita, MD [place] Director and Director of Stereotactic Radiosurgery Direct Dial: [phone]  Fax: [phone] .com  Skype  LinkedIn  This document serves as a record of services personally performed by Tyler Pita, MD and Shona Simpson, PA. It was created on his behalf by Maryla Morrow, a trained medical scribe. The creation of this record is based on the scribe's personal observations and the provider's statements to them. This document has been checked and approved by the attending provider.

## 2016-11-16 ENCOUNTER — Ambulatory Visit
Admission: RE | Admit: 2016-11-16 | Discharge: 2016-11-16 | Disposition: A | Discharge status: Home / Self Care | Payer: Managed Care, Other (non HMO) | Source: Ambulatory Visit | Attending: Radiation Oncology | Admitting: Radiation Oncology

## 2016-11-16 ENCOUNTER — Telehealth: Payer: Self-pay | Admitting: *Deleted

## 2016-11-16 DIAGNOSIS — Z510 Encounter for antineoplastic radiation therapy: Secondary | ICD-10-CM | POA: Diagnosis not present

## 2016-11-16 NOTE — Telephone Encounter (Signed)
  Oncology Nurse Navigator Documentation  Navigator Location: [facility] (11/16/16 1500)   )Navigator Encounter Type: Telephone (11/16/16 1500) Telephone: [facility] Call (11/16/16 1500)                   Patient Visit Type: RadOnc (11/16/16 1500) Treatment Phase: First Radiation Tx (11/16/16 1500)                            Time Spent with Patient: 15 (11/16/16 1500)

## 2016-11-17 ENCOUNTER — Ambulatory Visit
Admission: RE | Admit: 2016-11-17 | Discharge: 2016-11-17 | Disposition: A | Discharge status: Home / Self Care | Payer: Managed Care, Other (non HMO) | Source: Ambulatory Visit | Attending: Radiation Oncology | Admitting: Radiation Oncology

## 2016-11-17 DIAGNOSIS — Z510 Encounter for antineoplastic radiation therapy: Secondary | ICD-10-CM | POA: Diagnosis not present

## 2016-11-18 ENCOUNTER — Encounter: Payer: Self-pay | Admitting: Radiation Oncology

## 2016-11-18 ENCOUNTER — Ambulatory Visit
Admission: RE | Admit: 2016-11-18 | Discharge: 2016-11-18 | Disposition: A | Discharge status: Home / Self Care | Payer: Managed Care, Other (non HMO) | Source: Ambulatory Visit | Attending: Radiation Oncology | Admitting: Radiation Oncology

## 2016-11-18 DIAGNOSIS — Z510 Encounter for antineoplastic radiation therapy: Secondary | ICD-10-CM | POA: Diagnosis not present

## 2016-11-19 ENCOUNTER — Ambulatory Visit
Admission: RE | Admit: 2016-11-19 | Discharge: 2016-11-19 | Disposition: A | Discharge status: Home / Self Care | Payer: Managed Care, Other (non HMO) | Source: Ambulatory Visit | Attending: Radiation Oncology | Admitting: Radiation Oncology

## 2016-11-19 VITALS — BP 118/78 | HR 89 | Temp 98.3°F | Resp 12 | Wt 171.0 lb

## 2016-11-19 DIAGNOSIS — Z171 Estrogen receptor negative status [ER-]: Principal | ICD-10-CM

## 2016-11-19 DIAGNOSIS — C50412 Malignant neoplasm of upper-outer quadrant of left female breast: Secondary | ICD-10-CM

## 2016-11-19 DIAGNOSIS — Z510 Encounter for antineoplastic radiation therapy: Secondary | ICD-10-CM | POA: Diagnosis not present

## 2016-11-19 NOTE — Progress Notes (Signed)
PAIN: She rates her pain as a 2 on a scale of 0-10. intermittent and sharp over Left breast. SKIN: Pt left breast- positive for erythema and breast tenderness.  Pt denies edema.  Pt continues to apply Radiaplex as directed. OTHER:  BP 118/78   Pulse 89   Temp 98.3 F (36.8 C) (Oral)   Resp 12   Wt 171 lb (77.6 kg)   SpO2 100%   BMI 29.35 kg/m  Wt Readings from Last 3 Encounters:  11/19/16 171 lb (77.6 kg)  11/12/16 169 lb 12.8 oz (77 kg)  10/25/16 171 lb 3.2 oz (77.7 kg)

## 2016-11-19 NOTE — Progress Notes (Signed)
   Department of Radiation Oncology  Phone:  [phone] Fax:        [phone]  Weekly Treatment Note    Name: Gloria Smith Date: 11/19/2016 MRN: [id number] DOB: 09-21-1971   Diagnosis:     ICD-9-CM ICD-10-CM   1. Malignant neoplasm of upper-outer quadrant of left breast in female, estrogen receptor negative (HCC) 174.4 C50.412    V86.1 Z17.1      Current dose: 16.2 Gy  Current fraction: 9   MEDICATIONS: Current Outpatient Prescriptions  Medication Sig Dispense Refill  . hyaluronate sodium (RADIAPLEXRX) GEL Apply 1 application topically 2 (two) times daily.    Marland Kitchen ibuprofen (ADVIL,MOTRIN) 200 MG tablet Take 200 mg by mouth every 8 (eight) hours as needed.    . non-metallic deodorant Jethro Poling) MISC Apply 1 application topically daily as needed.    Marland Kitchen oxyCODONE-acetaminophen (PERCOCET) 10-325 MG tablet Take 1 tablet by mouth every 6 (six) hours as needed for pain. (Patient not taking: Reported on 11/12/2016) 20 tablet 0  . tamoxifen (NOLVADEX) 20 MG tablet Take 20 mg by mouth daily.  0   No current facility-administered medications for this encounter.      ALLERGIES: Patient has no known allergies.   LABORATORY DATA:  Lab Results  Component Value Date   WBC 10.8 (H) 10/01/2016   HGB 13.5 10/01/2016   HCT 41.7 10/01/2016   MCV 89.9 10/01/2016   PLT 464 (H) 10/01/2016   Lab Results  Component Value Date   NA 139 10/01/2016   K 4.2 10/01/2016   CL 106 10/01/2016   CO2 27 10/01/2016   Lab Results  Component Value Date   ALT 37 09/15/2016   AST 22 09/15/2016   ALKPHOS 122 09/15/2016   BILITOT 0.26 09/15/2016     NARRATIVE: Gloria Smith was seen today for weekly treatment management. The chart was checked and the patient's films were reviewed.  The patient has completed 9 fractions. She reports intermittent sharp pain over her left breast, 2/10 in severity. She denies any edema. She reports applying Radiaplex as directed.  PHYSICAL EXAMINATION:  weight is 171 lb (77.6 kg). Her oral temperature is 98.3 F (36.8 C). Her blood pressure is 118/78 and her pulse is 89. Her respiration is 12 and oxygen saturation is 100%.  The patient is well appearing. Mild erythema in the treatment area without any desquamation.  ASSESSMENT: The patient is doing satisfactorily with treatment.  PLAN: We will continue with the patient's radiation treatment as planned.   ------------------------------------------------  Jodelle Gross, MD, PhD  This document serves as a record of services personally performed by Kyung Rudd, MD. It was created on his behalf by Maryla Morrow, a trained medical scribe. The creation of this record is based on the scribe's personal observations and the provider's statements to them. This document has been checked and approved by the attending provider.

## 2016-11-23 ENCOUNTER — Ambulatory Visit
Admission: RE | Admit: 2016-11-23 | Discharge: 2016-11-23 | Disposition: A | Discharge status: Home / Self Care | Payer: Managed Care, Other (non HMO) | Source: Ambulatory Visit | Attending: Radiation Oncology | Admitting: Radiation Oncology

## 2016-11-23 DIAGNOSIS — Z510 Encounter for antineoplastic radiation therapy: Secondary | ICD-10-CM | POA: Diagnosis not present

## 2016-11-24 ENCOUNTER — Ambulatory Visit
Admission: RE | Admit: 2016-11-24 | Discharge: 2016-11-24 | Disposition: A | Discharge status: Home / Self Care | Payer: Managed Care, Other (non HMO) | Source: Ambulatory Visit | Attending: Radiation Oncology | Admitting: Radiation Oncology

## 2016-11-24 DIAGNOSIS — Z510 Encounter for antineoplastic radiation therapy: Secondary | ICD-10-CM | POA: Diagnosis not present

## 2016-11-25 ENCOUNTER — Ambulatory Visit
Admission: RE | Admit: 2016-11-25 | Discharge: 2016-11-25 | Disposition: A | Discharge status: Home / Self Care | Payer: Managed Care, Other (non HMO) | Source: Ambulatory Visit | Attending: Radiation Oncology | Admitting: Radiation Oncology

## 2016-11-25 DIAGNOSIS — Z510 Encounter for antineoplastic radiation therapy: Secondary | ICD-10-CM | POA: Diagnosis not present

## 2016-11-26 ENCOUNTER — Ambulatory Visit
Admission: RE | Admit: 2016-11-26 | Discharge: 2016-11-26 | Disposition: A | Discharge status: Home / Self Care | Payer: Managed Care, Other (non HMO) | Source: Ambulatory Visit | Attending: Radiation Oncology | Admitting: Radiation Oncology

## 2016-11-26 ENCOUNTER — Encounter: Payer: Self-pay | Admitting: Radiation Oncology

## 2016-11-26 VITALS — BP 113/54 | HR 75 | Temp 98.4°F | Ht 64.0 in | Wt 169.8 lb

## 2016-11-26 DIAGNOSIS — Z510 Encounter for antineoplastic radiation therapy: Secondary | ICD-10-CM | POA: Diagnosis not present

## 2016-11-26 DIAGNOSIS — C50412 Malignant neoplasm of upper-outer quadrant of left female breast: Secondary | ICD-10-CM

## 2016-11-26 DIAGNOSIS — Z171 Estrogen receptor negative status [ER-]: Principal | ICD-10-CM

## 2016-11-26 NOTE — Progress Notes (Signed)
Ms. Helgesen presents for her 13th fraction of radiation to her Left Breast and Left Axilla. She denies pain. She does report occasional sharp pains to her Left Chest area. She does have some soreness. She admits to some fatigue. Her Left Breast and Left Axilla area is red. She is using Radiaplex cream twice daily as directed.  BP (!) 113/54   Pulse 75   Temp 98.4 F (36.9 C)   Ht 5\' 4"  (1.626 m)   Wt 169 lb 12.8 oz (77 kg)   SpO2 100% Comment: room air  BMI 29.15 kg/m    Wt Readings from Last 3 Encounters:  11/26/16 169 lb 12.8 oz (77 kg)  11/19/16 171 lb (77.6 kg)  11/12/16 169 lb 12.8 oz (77 kg)

## 2016-11-26 NOTE — Progress Notes (Signed)
   Department of Radiation Oncology  Phone:  [phone] Fax:        [phone]  Weekly Treatment Note    Name: Gloria Smith Date: 11/26/2016 MRN: [id number] DOB: 12-Dec-1970   Diagnosis:     ICD-9-CM ICD-10-CM   1. Malignant neoplasm of upper-outer quadrant of left breast in female, estrogen receptor negative (HCC) 174.4 C50.412    V86.1 Z17.1        Current fraction:13   MEDICATIONS: Current Outpatient Prescriptions  Medication Sig Dispense Refill  . hyaluronate sodium (RADIAPLEXRX) GEL Apply 1 application topically 2 (two) times daily.    Marland Kitchen ibuprofen (ADVIL,MOTRIN) 200 MG tablet Take 200 mg by mouth every 8 (eight) hours as needed.    . non-metallic deodorant Jethro Poling) MISC Apply 1 application topically daily as needed.    Marland Kitchen oxyCODONE-acetaminophen (PERCOCET) 10-325 MG tablet Take 1 tablet by mouth every 6 (six) hours as needed for pain. (Patient not taking: Reported on 11/26/2016) 20 tablet 0  . tamoxifen (NOLVADEX) 20 MG tablet Take 20 mg by mouth daily.  0   No current facility-administered medications for this encounter.      ALLERGIES: Patient has no known allergies.   LABORATORY DATA:  Lab Results  Component Value Date   WBC 10.8 (H) 10/01/2016   HGB 13.5 10/01/2016   HCT 41.7 10/01/2016   MCV 89.9 10/01/2016   PLT 464 (H) 10/01/2016   Lab Results  Component Value Date   NA 139 10/01/2016   K 4.2 10/01/2016   CL 106 10/01/2016   CO2 27 10/01/2016   Lab Results  Component Value Date   ALT 37 09/15/2016   AST 22 09/15/2016   ALKPHOS 122 09/15/2016   BILITOT 0.26 09/15/2016     NARRATIVE: Gloria Smith was seen today for weekly treatment management. The chart was checked and the patient's films were reviewed.  [name] presents for her 13th fraction of radiation to her Left Breast and Left Axilla. She denies pain. She does report occasional sharp pains to her Left Chest area. She does have some soreness. She admits to some fatigue. Her  Left Breast and Left Axilla area is red. She is using Radiaplex cream twice daily as directed.  BP (!) 113/54   Pulse 75   Temp 98.4 F (36.9 C)   Ht 5\' 4"  (1.626 m)   Wt 169 lb 12.8 oz (77 kg)   SpO2 100% Comment: room air  BMI 29.15 kg/m    Wt Readings from Last 3 Encounters:  11/26/16 169 lb 12.8 oz (77 kg)  11/19/16 171 lb (77.6 kg)  11/12/16 169 lb 12.8 oz (77 kg)    PHYSICAL EXAMINATION: height is 5\' 4"  (1.626 m) and weight is 169 lb 12.8 oz (77 kg). Her temperature is 98.4 F (36.9 C). Her blood pressure is 113/54 (abnormal) and her pulse is 75. Her oxygen saturation is 100%.      Mild erythema in the treatment area  ASSESSMENT: The patient is doing satisfactorily with treatment.  PLAN: We will continue with the patient's radiation treatment as planned.

## 2016-11-29 ENCOUNTER — Ambulatory Visit
Admission: RE | Admit: 2016-11-29 | Discharge: 2016-11-29 | Disposition: A | Discharge status: Home / Self Care | Payer: Managed Care, Other (non HMO) | Source: Ambulatory Visit | Attending: Radiation Oncology | Admitting: Radiation Oncology

## 2016-11-29 ENCOUNTER — Ambulatory Visit (HOSPITAL_BASED_OUTPATIENT_CLINIC_OR_DEPARTMENT_OTHER): Payer: Managed Care, Other (non HMO)

## 2016-11-29 VITALS — BP 110/65 | HR 82 | Temp 97.3°F

## 2016-11-29 DIAGNOSIS — Z5111 Encounter for antineoplastic chemotherapy: Secondary | ICD-10-CM

## 2016-11-29 DIAGNOSIS — Z170 Estrogen receptor positive status [ER+]: Principal | ICD-10-CM

## 2016-11-29 DIAGNOSIS — C773 Secondary and unspecified malignant neoplasm of axilla and upper limb lymph nodes: Secondary | ICD-10-CM | POA: Diagnosis not present

## 2016-11-29 DIAGNOSIS — Z510 Encounter for antineoplastic radiation therapy: Secondary | ICD-10-CM | POA: Diagnosis not present

## 2016-11-29 DIAGNOSIS — C50412 Malignant neoplasm of upper-outer quadrant of left female breast: Secondary | ICD-10-CM

## 2016-11-29 MED ORDER — GOSERELIN ACETATE 3.6 MG ~~LOC~~ IMPL
3.6000 mg | DRUG_IMPLANT | Freq: Once | SUBCUTANEOUS | Status: AC
  Administered 2016-11-29: 3.6 mg via SUBCUTANEOUS
  Filled 2016-11-29: qty 3.6

## 2016-11-29 NOTE — Patient Instructions (Signed)
Goserelin injection What is this medicine? GOSERELIN (GOE se rel in) is similar to a hormone found in the body. It lowers the amount of sex hormones that the body makes. Men will have lower testosterone levels and women will have lower estrogen levels while taking this medicine. In men, this medicine is used to treat prostate cancer; the injection is either given once per month or once every 12 weeks. A once per month injection (only) is used to treat women with endometriosis, dysfunctional uterine bleeding, or advanced breast cancer. This medicine may be used for other purposes; ask your health care provider or pharmacist if you have questions. COMMON BRAND NAME(S): Zoladex What should I tell my health care provider before I take this medicine? They need to know if you have any of these conditions (some only apply to women): -diabetes -heart disease or previous heart attack -high blood pressure -high cholesterol -kidney disease -osteoporosis or low bone density -problems passing urine -spinal cord injury -stroke -tobacco smoker -an unusual or allergic reaction to goserelin, hormone therapy, other medicines, foods, dyes, or preservatives -pregnant or trying to get pregnant -breast-feeding How should I use this medicine? This medicine is for injection under the skin. It is given by a health care professional in a hospital or clinic setting. Men receive this injection once every 4 weeks or once every 12 weeks. Women will only receive the once every 4 weeks injection. Talk to your pediatrician regarding the use of this medicine in children. Special care may be needed. Overdosage: If you think you have taken too much of this medicine contact a poison control center or emergency room at once. NOTE: This medicine is only for you. Do not share this medicine with others. What if I miss a dose? It is important not to miss your dose. Call your doctor or health care professional if you are unable to  keep an appointment. What may interact with this medicine? -female hormones like estrogen -herbal or dietary supplements like black cohosh, chasteberry, or DHEA -female hormones like testosterone -prasterone This list may not describe all possible interactions. Give your health care provider a list of all the medicines, herbs, non-prescription drugs, or dietary supplements you use. Also tell them if you smoke, drink alcohol, or use illegal drugs. Some items may interact with your medicine. What should I watch for while using this medicine? Visit your doctor or health care professional for regular checks on your progress. Your symptoms may appear to get worse during the first weeks of this therapy. Tell your doctor or healthcare professional if your symptoms do not start to get better or if they get worse after this time. Your bones may get weaker if you take this medicine for a long time. If you smoke or frequently drink alcohol you may increase your risk of bone loss. A family history of osteoporosis, chronic use of drugs for seizures (convulsions), or corticosteroids can also increase your risk of bone loss. Talk to your doctor about how to keep your bones strong. This medicine should stop regular monthly menstration in women. Tell your doctor if you continue to menstrate. Women should not become pregnant while taking this medicine or for 12 weeks after stopping this medicine. Women should inform their doctor if they wish to become pregnant or think they might be pregnant. There is a potential for serious side effects to an unborn child. Talk to your health care professional or pharmacist for more information. Do not breast-feed an infant while taking   this medicine. Men should inform their doctors if they wish to father a child. This medicine may lower sperm counts. Talk to your health care professional or pharmacist for more information. What side effects may I notice from receiving this  medicine? Side effects that you should report to your doctor or health care professional as soon as possible: -allergic reactions like skin rash, itching or hives, swelling of the face, lips, or tongue -bone pain -breathing problems -changes in vision -chest pain -feeling faint or lightheaded, falls -fever, chills -pain, swelling, warmth in the leg -pain, tingling, numbness in the hands or feet -signs and symptoms of low blood pressure like dizziness; feeling faint or lightheaded, falls; unusually weak or tired -stomach pain -swelling of the ankles, feet, hands -trouble passing urine or change in the amount of urine -unusually high or low blood pressure -unusually weak or tired Side effects that usually do not require medical attention (report to your doctor or health care professional if they continue or are bothersome): -change in sex drive or performance -changes in breast size in both males and females -changes in emotions or moods -headache -hot flashes -irritation at site where injected -loss of appetite -skin problems like acne, dry skin -vaginal dryness This list may not describe all possible side effects. Call your doctor for medical advice about side effects. You may report side effects to FDA at 1-800-FDA-1088. Where should I keep my medicine? This drug is given in a hospital or clinic and will not be stored at home. NOTE: This sheet is a summary. It may not cover all possible information. If you have questions about this medicine, talk to your doctor, pharmacist, or health care provider.  2017 Elsevier/Gold Standard (2014-01-15 11:10:35)  

## 2016-11-30 ENCOUNTER — Ambulatory Visit
Admission: RE | Admit: 2016-11-30 | Discharge: 2016-11-30 | Disposition: A | Discharge status: Home / Self Care | Payer: Managed Care, Other (non HMO) | Source: Ambulatory Visit | Attending: Radiation Oncology | Admitting: Radiation Oncology

## 2016-11-30 DIAGNOSIS — Z510 Encounter for antineoplastic radiation therapy: Secondary | ICD-10-CM | POA: Diagnosis not present

## 2016-12-01 ENCOUNTER — Ambulatory Visit
Admission: RE | Admit: 2016-12-01 | Discharge: 2016-12-01 | Disposition: A | Discharge status: Home / Self Care | Payer: Managed Care, Other (non HMO) | Source: Ambulatory Visit | Attending: Radiation Oncology | Admitting: Radiation Oncology

## 2016-12-01 DIAGNOSIS — C50412 Malignant neoplasm of upper-outer quadrant of left female breast: Secondary | ICD-10-CM

## 2016-12-01 DIAGNOSIS — Z510 Encounter for antineoplastic radiation therapy: Secondary | ICD-10-CM | POA: Diagnosis not present

## 2016-12-01 DIAGNOSIS — Z170 Estrogen receptor positive status [ER+]: Principal | ICD-10-CM

## 2016-12-01 MED ORDER — RADIAPLEXRX EX GEL
Freq: Once | CUTANEOUS | Status: AC
  Administered 2016-12-01: 15:00:00 via TOPICAL

## 2016-12-02 ENCOUNTER — Ambulatory Visit
Admission: RE | Admit: 2016-12-02 | Discharge: 2016-12-02 | Disposition: A | Discharge status: Home / Self Care | Payer: Managed Care, Other (non HMO) | Source: Ambulatory Visit | Attending: Radiation Oncology | Admitting: Radiation Oncology

## 2016-12-02 DIAGNOSIS — Z510 Encounter for antineoplastic radiation therapy: Secondary | ICD-10-CM | POA: Diagnosis not present

## 2016-12-03 ENCOUNTER — Ambulatory Visit
Admission: RE | Admit: 2016-12-03 | Discharge: 2016-12-03 | Disposition: A | Discharge status: Home / Self Care | Payer: Managed Care, Other (non HMO) | Source: Ambulatory Visit | Attending: Radiation Oncology | Admitting: Radiation Oncology

## 2016-12-03 ENCOUNTER — Encounter: Payer: Self-pay | Admitting: Radiation Oncology

## 2016-12-03 VITALS — BP 117/72 | HR 91 | Temp 98.2°F | Resp 20 | Wt 171.2 lb

## 2016-12-03 DIAGNOSIS — C50412 Malignant neoplasm of upper-outer quadrant of left female breast: Secondary | ICD-10-CM

## 2016-12-03 DIAGNOSIS — Z510 Encounter for antineoplastic radiation therapy: Secondary | ICD-10-CM | POA: Diagnosis not present

## 2016-12-03 DIAGNOSIS — Z170 Estrogen receptor positive status [ER+]: Principal | ICD-10-CM

## 2016-12-03 NOTE — Progress Notes (Signed)
Weekly rad txs to left breast  18/33 completed,  Tenderness and tightnessin breast at times, erythema , skin is intact, using radiaplex bid, appetite good,  2:06 PM BP 117/72 (BP Location: Right Arm, Patient Position: Sitting, Cuff Size: Normal)   Pulse 91   Temp 98.2 F (36.8 C)   Resp 20   Wt 171 lb 3.2 oz (77.7 kg)   BMI 29.39 kg/m   Wt Readings from Last 3 Encounters:  12/03/16 171 lb 3.2 oz (77.7 kg)  11/26/16 169 lb 12.8 oz (77 kg)  11/19/16 171 lb (77.6 kg)

## 2016-12-03 NOTE — Progress Notes (Signed)
   Department of Radiation Oncology  Phone:  [phone] Fax:        [phone]  Weekly Treatment Note    Name: Gloria Smith Date: 12/03/2016 MRN: [id number] DOB: 03/09/1971   Diagnosis:     ICD-9-CM ICD-10-CM   1. Malignant neoplasm of upper-outer quadrant of left breast in female, estrogen receptor positive ([place]) 174.4 C50.412    V86.0 Z17.0      Current dose: 34.2 Gy  Current fraction: 18   MEDICATIONS: Current Outpatient Prescriptions  Medication Sig Dispense Refill  . hyaluronate sodium (RADIAPLEXRX) GEL Apply 1 application topically 2 (two) times daily.    Marland Kitchen ibuprofen (ADVIL,MOTRIN) 200 MG tablet Take 200 mg by mouth every 8 (eight) hours as needed.    . non-metallic deodorant Jethro Poling) MISC Apply 1 application topically daily as needed.    . tamoxifen (NOLVADEX) 20 MG tablet Take 20 mg by mouth daily.  0   No current facility-administered medications for this encounter.      ALLERGIES: Patient has no known allergies.   LABORATORY DATA:  Lab Results  Component Value Date   WBC 10.8 (H) 10/01/2016   HGB 13.5 10/01/2016   HCT 41.7 10/01/2016   MCV 89.9 10/01/2016   PLT 464 (H) 10/01/2016   Lab Results  Component Value Date   NA 139 10/01/2016   K 4.2 10/01/2016   CL 106 10/01/2016   CO2 27 10/01/2016   Lab Results  Component Value Date   ALT 37 09/15/2016   AST 22 09/15/2016   ALKPHOS 122 09/15/2016   BILITOT 0.26 09/15/2016     NARRATIVE: SANDE KEATS was seen today for weekly treatment management. The chart was checked and the patient's films were reviewed.  Weekly rad txs to left breast  18/33 completed,  Tenderness and tightnessin breast at times, erythema , skin is intact, using radiaplex bid, appetite good,  6:29 PM BP 117/72 (BP Location: Right Arm, Patient Position: Sitting, Cuff Size: Normal)   Pulse 91   Temp 98.2 F (36.8 C)   Resp 20   Wt 171 lb 3.2 oz (77.7 kg)   BMI 29.39 kg/m   Wt Readings from Last 3 Encounters:    12/03/16 171 lb 3.2 oz (77.7 kg)  11/26/16 169 lb 12.8 oz (77 kg)  11/19/16 171 lb (77.6 kg)    PHYSICAL EXAMINATION: weight is 171 lb 3.2 oz (77.7 kg). Her temperature is 98.2 F (36.8 C). Her blood pressure is 117/72 and her pulse is 91. Her respiration is 20.      Mild erythema in the treatment area  ASSESSMENT: The patient is doing satisfactorily with treatment.  PLAN: We will continue with the patient's radiation treatment as planned.

## 2016-12-06 ENCOUNTER — Ambulatory Visit
Admission: RE | Admit: 2016-12-06 | Discharge: 2016-12-06 | Disposition: A | Discharge status: Home / Self Care | Payer: Managed Care, Other (non HMO) | Source: Ambulatory Visit | Attending: Radiation Oncology | Admitting: Radiation Oncology

## 2016-12-06 DIAGNOSIS — Z510 Encounter for antineoplastic radiation therapy: Secondary | ICD-10-CM | POA: Diagnosis not present

## 2016-12-07 ENCOUNTER — Ambulatory Visit
Admission: RE | Admit: 2016-12-07 | Discharge: 2016-12-07 | Disposition: A | Discharge status: Home / Self Care | Payer: Managed Care, Other (non HMO) | Source: Ambulatory Visit | Attending: Radiation Oncology | Admitting: Radiation Oncology

## 2016-12-07 DIAGNOSIS — Z510 Encounter for antineoplastic radiation therapy: Secondary | ICD-10-CM | POA: Diagnosis not present

## 2016-12-08 ENCOUNTER — Ambulatory Visit
Admission: RE | Admit: 2016-12-08 | Discharge: 2016-12-08 | Disposition: A | Discharge status: Home / Self Care | Payer: Managed Care, Other (non HMO) | Source: Ambulatory Visit | Attending: Radiation Oncology | Admitting: Radiation Oncology

## 2016-12-08 DIAGNOSIS — Z510 Encounter for antineoplastic radiation therapy: Secondary | ICD-10-CM | POA: Diagnosis not present

## 2016-12-09 ENCOUNTER — Ambulatory Visit
Admission: RE | Admit: 2016-12-09 | Discharge: 2016-12-09 | Disposition: A | Discharge status: Home / Self Care | Payer: Managed Care, Other (non HMO) | Source: Ambulatory Visit | Attending: Radiation Oncology | Admitting: Radiation Oncology

## 2016-12-09 DIAGNOSIS — Z510 Encounter for antineoplastic radiation therapy: Secondary | ICD-10-CM | POA: Diagnosis not present

## 2016-12-10 ENCOUNTER — Ambulatory Visit
Admission: RE | Admit: 2016-12-10 | Discharge: 2016-12-10 | Disposition: A | Discharge status: Home / Self Care | Payer: Managed Care, Other (non HMO) | Source: Ambulatory Visit | Attending: Radiation Oncology | Admitting: Radiation Oncology

## 2016-12-10 ENCOUNTER — Encounter: Payer: Self-pay | Admitting: Radiation Oncology

## 2016-12-10 VITALS — BP 116/81 | HR 80 | Temp 98.3°F | Resp 16 | Wt 171.0 lb

## 2016-12-10 DIAGNOSIS — Z170 Estrogen receptor positive status [ER+]: Principal | ICD-10-CM

## 2016-12-10 DIAGNOSIS — C50412 Malignant neoplasm of upper-outer quadrant of left female breast: Secondary | ICD-10-CM

## 2016-12-10 DIAGNOSIS — Z510 Encounter for antineoplastic radiation therapy: Secondary | ICD-10-CM | POA: Diagnosis not present

## 2016-12-10 NOTE — Progress Notes (Signed)
Complex simulation note  Diagnosis: left-sided breast cancer  Narrative The patient has initially been planned to receive a course of whole breast radiation to a dose of 50.4 Gy in 28 fractions. The patient will now receive an additional boost to the seroma cavity which has been contoured. This will correspond to a boost of 10 Gy at 2 Gy per fraction. To accomplish this, an additional 3 customized blocks have been designed for this purpose. A complex isodose plan is requested to ensure that the target area is adequately covered with radiation dose and that the nearby normal structures such as the lung are adequately spared. The patient's final total dose will be 6040 Gy.  ------------------------------------------------  Gloria Gross, MD, PhD

## 2016-12-10 NOTE — Progress Notes (Signed)
   Department of Radiation Oncology  Phone:  [phone] Fax:        [phone]  Weekly Treatment Note    Name: Gloria Smith Date: 12/10/2016 MRN: [id number] DOB: 1971-07-22   Diagnosis:     ICD-9-CM ICD-10-CM   1. Malignant neoplasm of upper-outer quadrant of left breast in female, estrogen receptor positive ([place]) 174.4 C50.412    V86.0 Z17.0      Current dose: 41.4 Gy  Current fraction: 23   MEDICATIONS: Current Outpatient Prescriptions  Medication Sig Dispense Refill  . hyaluronate sodium (RADIAPLEXRX) GEL Apply 1 application topically 2 (two) times daily.    Marland Kitchen ibuprofen (ADVIL,MOTRIN) 200 MG tablet Take 200 mg by mouth every 8 (eight) hours as needed.    . non-metallic deodorant Jethro Poling) MISC Apply 1 application topically daily as needed.    . tamoxifen (NOLVADEX) 20 MG tablet Take 20 mg by mouth daily.  0   No current facility-administered medications for this encounter.      ALLERGIES: Patient has no known allergies.   LABORATORY DATA:  Lab Results  Component Value Date   WBC 10.8 (H) 10/01/2016   HGB 13.5 10/01/2016   HCT 41.7 10/01/2016   MCV 89.9 10/01/2016   PLT 464 (H) 10/01/2016   Lab Results  Component Value Date   NA 139 10/01/2016   K 4.2 10/01/2016   CL 106 10/01/2016   CO2 27 10/01/2016   Lab Results  Component Value Date   ALT 37 09/15/2016   AST 22 09/15/2016   ALKPHOS 122 09/15/2016   BILITOT 0.26 09/15/2016     NARRATIVE: Gloria Smith was seen today for weekly treatment management. The chart was checked and the patient's films were reviewed.  The patient is doing well this week. Some increased irritation both in the nipple region as well as in the inframammary region.  PHYSICAL EXAMINATION: weight is 171 lb (77.6 kg). Her oral temperature is 98.3 F (36.8 C). Her blood pressure is 116/81 and her pulse is 80. Her respiration is 16.      Moderate erythema in the treatment area. No significant desquamation thus  far  ASSESSMENT: The patient is doing satisfactorily with treatment.  PLAN: We will continue with the patient's radiation treatment as planned.  She was given some gel pads today to help with irritative symptoms as well

## 2016-12-10 NOTE — Progress Notes (Addendum)
Weekly rad txs left breast, erythema and bumps on breast, dryness on top of left back  Shoulder, skin is intact, under inframmary fold and axilla, skin is getting thin, nipple tender, uses radiaplex bid gave telfa pads to protect nipple area from bras occasional twinges, less itchy ness stated, appetite good 2:06 PM BP 116/81 (BP Location: Right Arm, Patient Position: Sitting, Cuff Size: Normal)   Pulse 80   Temp 98.3 F (36.8 C) (Oral)   Resp 16   Wt 171 lb (77.6 kg)   BMI 29.35 kg/m   Wt Readings from Last 3 Encounters:  12/10/16 171 lb (77.6 kg)  12/03/16 171 lb 3.2 oz (77.7 kg)  11/26/16 169 lb 12.8 oz (77 kg)

## 2016-12-10 NOTE — Progress Notes (Signed)
[place]  Telephone:([phone]) [phone] Fax:(336) [phone]  Clinic Follow Up Note   Patient Care Team: Aloha Gell, MD as PCP - General (Obstetrics and Gynecology) Kyung Rudd, MD as Consulting Physician (Radiation Oncology) Truitt Merle, MD as Consulting Physician (Hematology) Rolm Bookbinder, MD as Consulting Physician (General Surgery) 12/13/2016  CHIEF COMPLAINTS:  Follow up left breast cancer  Oncology History   Breast cancer of upper-outer quadrant of left female breast [facility])   Staging form: Breast, AJCC 7th Edition   - Clinical stage from 09/06/2016: Stage IIA (T1b, N1, M0) - Signed by Truitt Merle, MD on 09/15/2016   - Pathologic stage from 10/05/2016: Stage IIA (T1b, N1a, cM0) - Signed by Truitt Merle, MD on 10/24/2016      Breast cancer of upper-outer quadrant of left female breast ([place])   09/06/2016 Initial Diagnosis    Malignant neoplasm of upper-outer quadrant of right female breast ([place])      09/06/2016 Initial Biopsy    Left breast 3:00 position showed invasive ductal carcinoma and DCIS, left axillary lymph node biopsy showed metastatic carcinoma with extracapsular extension      09/06/2016 Receptors her2    Breast tumor ER 100% positive, PR 100% positive, HER-2 negative, Ki-67 5%      09/06/2016 Mammogram    Diagnostic mammogram and ultrasound showed a 1.0 cm mass at the 3 clock position of left breast, and the enlarged left axillary node with cortical thickening. The 61m cm oval cyst in the right breast is likely benign.      09/06/2016 Miscellaneous    Mammaprint showed low risk luminal A type, with average 10 year risk of recurrence untreated 10%.      10/05/2016 Surgery    Left breast lumpectomy and SLN biopsy       10/05/2016 Pathology Results    Left breast lumpectomy showed invasive and in situ ductal carcinoma, 1 cm, grade 1, margins are negative, 2 sentinel lymph nodes are positive for metastatic carcinoma with extracapsular extension.  LVI (+)      11/08/2016 -  Radiation Therapy     Adjuvant radiation to the left breast and left axilla by Dr. MLisbeth Renshaw       HISTORY OF PRESENTING ILLNESS:  Gloria HILBURN46y.o. female is here because of her recently diagnosed left breast cancer. She presents to our multidisciplinary breast clinic with her husband.    She had screening mammogram on 09/01/2016 at her gynecologist office, which showed a bilateral breast nodules. Her previous mammogram was 4 years ago which was normal. She denies any significant symptoms, except occasional mild left breast shooting pain for the past few months, no palpable mass, skin change or nipple discharge. She feels well overall. She gained about 20lbs in the past 6 months   [facility] which showed a benign 859mcyst in the right breast, and a 1 cm mass in the 3:00 position of left breast, and I enlarged left axillary node. Both the left breast mass and axillary node biopsy showed invasive ductal carcinoma, ER/PR strongly positive, HER-2 negative.  GYN HISTORY  Menarchal: 12 LMP: spotting 08/2016, she has IUD (LStacie AcresContraceptive:  HRT: n/a G4P1: she has 1956o son   CURRENT THERAPY: Adjuvant breast and left axilla radiation started on 11/08/16; Zoladex 3.6 mg since 11/01/2016 (once a month)  INTERIM HISTORY:  The patient returns for follow up. The patient states her left breast is red. She reports her  left nipple and the inframammary fold of her left breast are sore. She reports fatigue to the point she had to take a day off from work. She is able to complete her everyday activities. She stopped taking Tamoxifen after surgery. She is not sure if she tolerated it well since she only took it for around 2 weeks. She has some hot flashes on Zoladex.  MEDICAL HISTORY:  Past Medical History:  Diagnosis Date  . Headache    ocular migraines - very rare  . Malignant neoplasm of upper-outer  quadrant of right female breast ([place]) 09/10/2016   pt states that cancer is on left breast, not right  . Urinary frequency     SURGICAL HISTORY: Past Surgical History:  Procedure Laterality Date  . BREAST LUMPECTOMY WITH RADIOACTIVE SEED AND SENTINEL LYMPH NODE BIOPSY Left 10/05/2016   Procedure: LEFT BREAST RADIOACTIVE SEED GUIDED LUMPECTOMY, LEFT AXILLARY RADIOACTIVE SEED GUIDED NODE EXCISION, LEFT AXILLARY SENTINEL LYMPH NODE BIOPSY(LEFT TARGETED AXILLARY DISSECTION);  Surgeon: Rolm Bookbinder, MD;  Location: [facility];  Service: General;  Laterality: Left;  . DILATION AND CURETTAGE OF UTERUS    . right knee surgery at age of 61       SOCIAL HISTORY: Social History   Social History  . Marital status: Single    Spouse name: N/A  . Number of children: N/A  . Years of education: N/A   Occupational History  . Not on file.   Social History Main Topics  . Smoking status: Former Smoker    Packs/day: 0.50    Years: 30.00    Quit date: 08/22/2016  . Smokeless tobacco: Never Used  . Alcohol use 0.6 oz/week    1 Glasses of wine per week     Comment: 6 drinks wine or beer a week   . Drug use: No  . Sexual activity: Not on file   Other Topics Concern  . Not on file   Social History Narrative  . No narrative on file   She works as a Regulatory affairs officer, works from home   FAMILY HISTORY: Family History  Problem Relation Age of Onset  . Colon cancer Paternal Grandmother 95    d. 55  . Breast cancer Cousin     paternal 1st cousin, once-removed; dx. 34s  . Other Mother     hx precancerous skin finding removed from eyelid at 70-71  . Heart attack Maternal Grandmother 70  . Stroke Maternal Grandfather   . Heart attack Paternal Grandfather 63  . Other Other 25    hx of precancerous finding on abnormal pap smear  . Cancer Other     maternal great aunt (MGM's sister) d. early 71s; spinal cancer, but this may have been a secondary cancer w/ NOS primary  . Cancer Other      maternal great grandmother (MGM's mother) d. 9s w/ NOS cancer  . Cancer Other     paternal great uncle (PGM's brother) d. 60s w/ NOS cancer    ALLERGIES:  has No Known Allergies.  MEDICATIONS:  Current Outpatient Prescriptions  Medication Sig Dispense Refill  . hyaluronate sodium (RADIAPLEXRX) GEL Apply 1 application topically 2 (two) times daily.    Marland Kitchen ibuprofen (ADVIL,MOTRIN) 200 MG tablet Take 200 mg by mouth every 8 (eight) hours as needed.    . non-metallic deodorant Jethro Poling) MISC Apply 1 application topically daily as needed.    Marland Kitchen exemestane (AROMASIN) 25 MG tablet Take 1 tablet (25 mg total) by mouth daily after  breakfast. 30 tablet 2  . tamoxifen (NOLVADEX) 20 MG tablet Take 20 mg by mouth daily.  0   No current facility-administered medications for this visit.     REVIEW OF SYSTEMS:   Constitutional: Denies fevers, chills or abnormal night sweats (+) fatigue (+) left breast soreness Eyes: Denies blurriness of vision, double vision or watery eyes Ears, nose, mouth, throat, and face: Denies mucositis or sore throat Respiratory: Denies cough, dyspnea or wheezes Cardiovascular: Denies palpitation, chest discomfort or lower extremity swelling Gastrointestinal:  Denies nausea, heartburn or change in bowel habits Skin: Denies abnormal skin rashes Lymphatics: Denies new lymphadenopathy or easy bruising Neurological:Denies numbness, tingling or new weaknesses Behavioral/Psych: Mood is stable, no new changes  All other systems were reviewed with the patient and are negative.  PHYSICAL EXAMINATION: ECOG PERFORMANCE STATUS: 0 - Asymptomatic  Vitals:   12/13/16 0921  BP: (!) 106/55  Pulse: 75  Resp: 18  Temp: 98.4 F (36.9 C)   Filed Weights   12/13/16 0921  Weight: 170 lb 12.8 oz (77.5 kg)    GENERAL:alert, no distress and comfortable SKIN: skin color, texture, turgor are normal, no rashes or significant lesions EYES: normal, conjunctiva are pink and non-injected, sclera  clear OROPHARYNX:no exudate, no erythema and lips, buccal mucosa, and tongue normal  NECK: supple, thyroid normal size, non-tender, without nodularity LYMPH:  no palpable lymphadenopathy in the cervical, axillary or inguinal LUNGS: clear to auscultation and percussion with normal breathing effort HEART: regular rate & rhythm and no murmurs and no lower extremity edema ABDOMEN:abdomen soft, non-tender and normal bowel sounds Musculoskeletal:no cyanosis of digits and no clubbing  PSYCH: alert & oriented x 3 with fluent speech NEURO: no focal motor/sensory deficits Breasts: Diffuse skin erythema of the left breast from radiation, no ulcers or discharge. Small scar behind the older incision in the left breast UOQ.  LABORATORY DATA:  I have reviewed the data as listed CBC Latest Ref Rng & Units 12/13/2016 10/01/2016 09/15/2016  WBC 3.9 - 10.3 10e3/uL 9.8 10.8(H) 14.7(H)  Hemoglobin 11.6 - 15.9 g/dL 13.6 13.5 13.8  Hematocrit 34.8 - 46.6 % 41.2 41.7 42.5  Platelets 145 - 400 10e3/uL 402(H) 464(H) 448(H)   CMP Latest Ref Rng & Units 12/13/2016 10/01/2016 09/15/2016  Glucose 70 - 140 mg/dl 81 92 126  BUN 7.0 - 26.0 mg/dL 11.9 14 14.9  Creatinine 0.6 - 1.1 mg/dL 0.8 0.93 0.8  Sodium 136 - 145 mEq/L 141 139 140  Potassium 3.5 - 5.1 mEq/L 4.1 4.2 4.2  Chloride 101 - 111 mmol/L - 106 -  CO2 22 - 29 mEq/L _0 Calcium 8.4 - 10.4 mg/dL 9.6 9.2 9.1  Total Protein 6.4 - 8.3 g/dL 6.9 - 7.3  Total Bilirubin 0.20 - 1.20 mg/dL 0.40 - 0.26  Alkaline Phos 40 - 150 U/L 121 - 122  AST 5 - 34 U/L 18 - 22  ALT 0 - 55 U/L 30 - 37     PATHOLOGY REPORT  Diagnosis 09/06/2016 1. Breast, left, needle core biopsy, 3 o'clock - INVASIVE DUCTAL CARCINOMA. - DUCTAL CARCINOMA IN SITU.  - SEE COMMENT. 2. Lymph node, axilla and upper limb, left - METASTATIC CARCINOMA IN 1 OF 1 LYMPH NODE (1/1), WITH EXTRACAPSULAR EXTENSION. Microscopic Comment 1. The carcinoma appears grade 2. A breast prognostic profile  will be performed and the results reported separately. The results were called to [facility] on 09/07/16. (JBK:gt, 09/07/16) 1. PROGNOSTIC INDICATORS Results: IMMUNOHISTOCHEMICAL AND MORPHOMETRIC ANALYSIS  PERFORMED MANUALLY Estrogen Receptor: 100%, POSITIVE, STRONG STAINING INTENSITY Progesterone Receptor: 100%, POSITIVE, STRONG STAINING INTENSITY Proliferation Marker Ki67: 5%  Results: HER2 - NEGATIVE RATIO OF HER2/CEP17 SIGNALS 1.46 AVERAGE HER2 COPY NUMBER PER CELL 3.00  Diagnosis 10/05/2016 1. Breast, lumpectomy, Left - INVASIVE AND IN SITU DUCTAL CARCINOMA, 1 CM. - MARGINS NOT INVOLVED. - FIBROCYSTIC CHANGES. 2. Lymph node, sentinel, biopsy, Left #1 - METASTATIC CARCINOMA IN ONE LYMPH NODE WITH EXTRACAPSULAR EXTENSION (1/1). 3. Lymph node, sentinel, biopsy, Left #2 - METASTATIC CARCINOMA IN ONE LYMPH NODE WITH EXTRACAPSULAR EXTENSION (1/1). Microscopic Comment 1. BREAST, INVASIVE TUMOR, WITH LYMPH NODES PRESENT Specimen, including laterality and lymph node sampling (sentinel, non-sentinel): Left breast with two sentinel lymph nodes Procedure: Localized lumpectomy and sentinel lymph node biopsies. Histologic type: Ductal Grade: 1 Tubule formation: 1 Nuclear pleomorphism: 2 Mitotic: 1 Tumor size (gross measurement) 1 cm Margins: Free of tumor Invasive, distance to closest margin: 0.3 cm from anterior margin In-situ, distance to closest margin: 0.2 cm from anterior margin If margin positive, focally or broadly: N/A Lymphovascular invasion: Present Ductal carcinoma in situ: Present Grade: Low grade Extensive intraductal component: No Lobular neoplasia: No Tumor focality: Unifocal Extent of tumor: 1 of 3 FINAL for STAPHANY, DITTON ([id number]) Microscopic Comment(continued) Skin: N/A Nipple: N/A Skeletal muscle: N/A Lymph nodes: Examined: 2 Sentinel 0 Non-sentinel 2 Total Lymph nodes with metastasis: 2 Isolated tumor cells (< 0.2 mm):  0 Micrometastasis: (> 0.2 mm and < 2.0 mm): 0 Macrometastasis: (> 2.0 mm): 2 Extracapsular extension: Present, both nodes Breast prognostic profile: Case [id number] Estrogen receptor: 100% and 95%, positive Progesterone receptor: 100% and 100%, positive Her 2 neu: Negative, ratio is 1.46 and 1.45 Ki-67: 5% and 10% Non-neoplastic breast: Fibrocystic changes TNM: pT1b, pN1a, pMX (JDP:kh 10-06-16) Claudette Laws MD Pathologist, Electronic Signature (Case signed 10/06/2016)   Mammaprint 09/06/2016      RADIOGRAPHIC STUDIES: I have personally reviewed the radiological images as listed and agreed with the findings in the report. No results found.  I have reviewed her outside mammogram and ultrasound results.  ASSESSMENT & PLAN:  46 y.o. pre-or perimenopausal woman, presented with screening discovered left breast mass and axilla adenopathy.  1. Breast cancer of upper-outer quadrant of left female breast, pT1bN1aM0 stage IIA, G1, ER+/PR+/HER2-, Ki67 5% -I previously discussed her surgical pathology results with the patient in details. -She has had a complete surgical resections, margins were negative, she had 2 positive sentinel lymph nodes with extracapsular extension, (+) LVI which are risk for recurrence.  -We did mammaprint on her initial biopsy tumor tissue, which showed low risk disease, luminal type A. Her risk of recurrence in 10 years without adjuvant therapy is 10%. Her 5 year distant metastasis free survival is 97.8% with adjuvant antiestrogen therapy. Based on the mammaprint results, I do not recommend adjuvant chemotherapy. -Giving the strong ER and PR expression in her tumor and her pre or perimenopausal status, I recommend adjuvant endocrine therapy with tamoxifen for 10 years to reduce the risk of cancer recurrence. She started before surgery, and tolerated well.  -We discussed the alternative endocrine therapy with aromatase inhibitor and over suppression, due to her young  age, and stage II a node positive disease, I think she would benefit more from OS+AI then tamoxifen. The potential benefit and side effects of aromatase inhibitor, which includes but not limited to, hot flashes, vaginal and skin dryness, metabolic impact on her cholesterol, blood glucose, mood swing, weight gain, osteoporosis, etc., were discussed with her in details,  she is interested. I plan to start her on exemestane 2 weeks after she completes radiation. She has baseline mild arthritis, if she has severe arthralgia from AI, I'll switch her back to tamoxifen. ---The patient is not eligible for the PALLAS study because the study is close to stage II disease. -She started adjuvant breast radiation on 11/08/16. -The patient started Zoladex 3.6 mg on 11/01/16 (once a month) and will continue. She is interested in BSO, she will discuss with her gynecologist Dr. Valentino Saxon in the next few months. -She is clinically doing well, left results reviewed with her. She is tolerating radiation well, we'll finish in 2 weeks -I cautery exemestane to her pharmacy today, she will start 2 weeks after she completes radiation.  2. Genetics  -Giving her young age and positive family history of breast cancer, she'll be referred to genetic counselor for genetic testing, to ruled out inheritable breast cancer syndrome. -Negative for known pathogenic mutations within any of 20 genes on the Breast/Ovarian Cancer Panel through [employer] [facility].  One variant of uncertain significance (VUS) called "c.3571A>G (p.Ile1191Val)" was found in one copy of the BRIP1 gene.  The Breast/Ovarian Cancer Panel offered by [facility] Hope Pigeon, MD) includes sequencing and deletion/duplication analysis for the following 19 genes:  ATM, BARD1, BRCA1, BRCA2, BRIP1, CDH1, CHEK2, FANCC, MLH1, MSH2, MSH6, NBN, PALB2, PMS2, PTEN, RAD51C, RAD51D, TP53, and XRCC2.  This panel also includes deletion/duplication analysis (without sequencing) for  one gene, EPCAM.   PLAN -Continue adjuvant radiation to the left breast, she will complete in a week. -I discussed her lab results and genetic testing. I provided copies of her genetic testing, pathology, and labs today. -she will start Exemestane in 4 weeks, I called in to her pharmacy today  -Zoladex 3.6 mg every 4 weeks. Next dose 12/27/16. She'll discuss BSO with her GYN in a few months  -Lab and f/u in 2 months. -Dexa scan in 3-4 weeks at [facility].   All questions were answered. The patient knows to call the clinic with any problems, questions or concerns. I spent 25 minutes counseling the patient face to face. The total time spent in the appointment was 30 minutes and more than 50% was on counseling.     Truitt Merle, MD 12/13/2016   This document serves as a record of services personally performed by Truitt Merle, MD. It was created on her behalf by Darcus Austin, a trained medical scribe. The creation of this record is based on the scribe's personal observations and the provider's statements to them. This document has been checked and approved by the attending provider.

## 2016-12-13 ENCOUNTER — Ambulatory Visit
Admission: RE | Admit: 2016-12-13 | Discharge: 2016-12-13 | Disposition: A | Discharge status: Home / Self Care | Payer: Managed Care, Other (non HMO) | Source: Ambulatory Visit | Attending: Radiation Oncology | Admitting: Radiation Oncology

## 2016-12-13 ENCOUNTER — Telehealth: Payer: Self-pay | Admitting: Hematology

## 2016-12-13 ENCOUNTER — Other Ambulatory Visit (HOSPITAL_BASED_OUTPATIENT_CLINIC_OR_DEPARTMENT_OTHER): Payer: Managed Care, Other (non HMO)

## 2016-12-13 ENCOUNTER — Encounter: Payer: Self-pay | Admitting: Hematology

## 2016-12-13 ENCOUNTER — Ambulatory Visit (HOSPITAL_BASED_OUTPATIENT_CLINIC_OR_DEPARTMENT_OTHER): Payer: Managed Care, Other (non HMO) | Admitting: Hematology

## 2016-12-13 VITALS — BP 106/55 | HR 75 | Temp 98.4°F | Resp 18 | Ht 64.0 in | Wt 170.8 lb

## 2016-12-13 DIAGNOSIS — C773 Secondary and unspecified malignant neoplasm of axilla and upper limb lymph nodes: Secondary | ICD-10-CM | POA: Diagnosis not present

## 2016-12-13 DIAGNOSIS — C50412 Malignant neoplasm of upper-outer quadrant of left female breast: Secondary | ICD-10-CM | POA: Diagnosis not present

## 2016-12-13 DIAGNOSIS — Z170 Estrogen receptor positive status [ER+]: Secondary | ICD-10-CM | POA: Diagnosis not present

## 2016-12-13 DIAGNOSIS — Z79810 Long term (current) use of selective estrogen receptor modulators (SERMs): Secondary | ICD-10-CM

## 2016-12-13 DIAGNOSIS — E2839 Other primary ovarian failure: Secondary | ICD-10-CM

## 2016-12-13 DIAGNOSIS — Z510 Encounter for antineoplastic radiation therapy: Secondary | ICD-10-CM | POA: Diagnosis not present

## 2016-12-13 LAB — CBC WITH DIFFERENTIAL/PLATELET
BASO%: 0.5 % (ref 0.0–2.0)
BASOS ABS: 0.1 10*3/uL (ref 0.0–0.1)
EOS ABS: 0.3 10*3/uL (ref 0.0–0.5)
EOS%: 3.1 % (ref 0.0–7.0)
HCT: 41.2 % (ref 34.8–46.6)
HGB: 13.6 g/dL (ref 11.6–15.9)
LYMPH%: 12.3 % — AB (ref 14.0–49.7)
MCH: 29.4 pg (ref 25.1–34.0)
MCHC: 33 g/dL (ref 31.5–36.0)
MCV: 89.2 fL (ref 79.5–101.0)
MONO#: 0.8 10*3/uL (ref 0.1–0.9)
MONO%: 8.2 % (ref 0.0–14.0)
NEUT#: 7.4 10*3/uL — ABNORMAL HIGH (ref 1.5–6.5)
NEUT%: 75.9 % (ref 38.4–76.8)
PLATELETS: 402 10*3/uL — AB (ref 145–400)
RBC: 4.62 10*6/uL (ref 3.70–5.45)
RDW: 13.2 % (ref 11.2–14.5)
WBC: 9.8 10*3/uL (ref 3.9–10.3)
lymph#: 1.2 10*3/uL (ref 0.9–3.3)

## 2016-12-13 LAB — COMPREHENSIVE METABOLIC PANEL
ALT: 30 U/L (ref 0–55)
ANION GAP: 7 meq/L (ref 3–11)
AST: 18 U/L (ref 5–34)
Albumin: 3.5 g/dL (ref 3.5–5.0)
Alkaline Phosphatase: 121 U/L (ref 40–150)
BUN: 11.9 mg/dL (ref 7.0–26.0)
CO2: 29 meq/L (ref 22–29)
Calcium: 9.6 mg/dL (ref 8.4–10.4)
Chloride: 105 mEq/L (ref 98–109)
Creatinine: 0.8 mg/dL (ref 0.6–1.1)
EGFR: 85 mL/min/{1.73_m2} — AB (ref >90)
Glucose: 81 mg/dl (ref 70–140)
Potassium: 4.1 mEq/L (ref 3.5–5.1)
Sodium: 141 mEq/L (ref 136–145)
TOTAL PROTEIN: 6.9 g/dL (ref 6.4–8.3)
Total Bilirubin: 0.4 mg/dL (ref 0.20–1.20)

## 2016-12-13 MED ORDER — EXEMESTANE 25 MG PO TABS: 25.0000 mg | ORAL_TABLET | Freq: Every day | ORAL | 2 refills | Status: DC

## 2016-12-13 NOTE — Telephone Encounter (Signed)
Message sent to Dr Burr Medico to enter Dexa order, so that test may be scheduled, per 12/13/16 los.  Appointments scheduled per 12/13/16 los. Patient was given a copy of the updated appointment schedule and AVS report per 12/13/16 los.

## 2016-12-13 NOTE — Addendum Note (Signed)
Addended by: Truitt Merle on: 12/13/2016 11:28 AM   Modules accepted: Orders

## 2016-12-14 ENCOUNTER — Ambulatory Visit
Admission: RE | Admit: 2016-12-14 | Discharge: 2016-12-14 | Disposition: A | Discharge status: Home / Self Care | Payer: Managed Care, Other (non HMO) | Source: Ambulatory Visit | Attending: Radiation Oncology | Admitting: Radiation Oncology

## 2016-12-14 DIAGNOSIS — Z510 Encounter for antineoplastic radiation therapy: Secondary | ICD-10-CM | POA: Diagnosis not present

## 2016-12-14 DIAGNOSIS — Z170 Estrogen receptor positive status [ER+]: Secondary | ICD-10-CM | POA: Insufficient documentation

## 2016-12-14 DIAGNOSIS — C50412 Malignant neoplasm of upper-outer quadrant of left female breast: Secondary | ICD-10-CM | POA: Insufficient documentation

## 2016-12-15 ENCOUNTER — Ambulatory Visit
Admission: RE | Admit: 2016-12-15 | Discharge: 2016-12-15 | Disposition: A | Discharge status: Home / Self Care | Payer: Managed Care, Other (non HMO) | Source: Ambulatory Visit | Attending: Radiation Oncology | Admitting: Radiation Oncology

## 2016-12-15 DIAGNOSIS — Z510 Encounter for antineoplastic radiation therapy: Secondary | ICD-10-CM | POA: Diagnosis not present

## 2016-12-16 ENCOUNTER — Ambulatory Visit
Admission: RE | Admit: 2016-12-16 | Discharge: 2016-12-16 | Disposition: A | Discharge status: Home / Self Care | Payer: Managed Care, Other (non HMO) | Source: Ambulatory Visit | Attending: Radiation Oncology | Admitting: Radiation Oncology

## 2016-12-16 DIAGNOSIS — Z510 Encounter for antineoplastic radiation therapy: Secondary | ICD-10-CM | POA: Diagnosis not present

## 2016-12-17 ENCOUNTER — Ambulatory Visit
Admission: RE | Admit: 2016-12-17 | Discharge: 2016-12-17 | Disposition: A | Discharge status: Home / Self Care | Payer: Managed Care, Other (non HMO) | Source: Ambulatory Visit | Attending: Radiation Oncology | Admitting: Radiation Oncology

## 2016-12-17 ENCOUNTER — Encounter: Payer: Self-pay | Admitting: Radiation Oncology

## 2016-12-17 VITALS — BP 116/66 | HR 76 | Temp 98.3°F | Resp 16 | Wt 172.2 lb

## 2016-12-17 DIAGNOSIS — C50412 Malignant neoplasm of upper-outer quadrant of left female breast: Secondary | ICD-10-CM | POA: Diagnosis not present

## 2016-12-17 DIAGNOSIS — Z170 Estrogen receptor positive status [ER+]: Secondary | ICD-10-CM | POA: Diagnosis not present

## 2016-12-17 DIAGNOSIS — Z510 Encounter for antineoplastic radiation therapy: Secondary | ICD-10-CM | POA: Diagnosis not present

## 2016-12-17 MED ORDER — RADIAPLEXRX EX GEL
Freq: Once | CUTANEOUS | Status: AC
  Administered 2016-12-17: 14:00:00 via TOPICAL

## 2016-12-17 NOTE — Progress Notes (Signed)
   Department of Radiation Oncology  Phone:  [phone] Fax:        [phone]  Weekly Treatment Note    Name: Gloria Smith Date: 12/17/2016 MRN: [id number] DOB: September 26, 1971   Diagnosis:     ICD-9-CM ICD-10-CM   1. Malignant neoplasm of upper-outer quadrant of left breast in female, estrogen receptor positive ([facility]) 174.4 C50.412 hyaluronate sodium (RADIAPLEXRX) gel   V86.0 Z17.0      Current dose: 50.4 Gy  Current fraction: 28   MEDICATIONS: Current Outpatient Prescriptions  Medication Sig Dispense Refill  . exemestane (AROMASIN) 25 MG tablet Take 1 tablet (25 mg total) by mouth daily after breakfast. 30 tablet 2  . hyaluronate sodium (RADIAPLEXRX) GEL Apply 1 application topically 2 (two) times daily.    Marland Kitchen ibuprofen (ADVIL,MOTRIN) 200 MG tablet Take 200 mg by mouth every 8 (eight) hours as needed.    . non-metallic deodorant Jethro Poling) MISC Apply 1 application topically daily as needed.    . tamoxifen (NOLVADEX) 20 MG tablet Take 20 mg by mouth daily.  0   No current facility-administered medications for this encounter.      ALLERGIES: Patient has no known allergies.   LABORATORY DATA:  Lab Results  Component Value Date   WBC 9.8 12/13/2016   HGB 13.6 12/13/2016   HCT 41.2 12/13/2016   MCV 89.2 12/13/2016   PLT 402 (H) 12/13/2016   Lab Results  Component Value Date   NA 141 12/13/2016   K 4.1 12/13/2016   CL 106 10/01/2016   CO2 29 12/13/2016   Lab Results  Component Value Date   ALT 30 12/13/2016   AST 18 12/13/2016   ALKPHOS 121 12/13/2016   BILITOT 0.40 12/13/2016     NARRATIVE: Gloria Smith was seen today for weekly treatment management. The chart was checked and the patient's films were reviewed.  The patient is doing well overall I believe. She has had more fatigue this week and was somewhat emotional today. No major issues states but all of her treatment she feels has been catching up to her.  PHYSICAL EXAMINATION: weight is 172 lb  3.2 oz (78.1 kg). Her oral temperature is 98.3 F (36.8 C). Her blood pressure is 116/66 and her pulse is 76. Her respiration is 16.      Diffuse erythema in the treatment area without any significant desquamation.  ASSESSMENT: The patient is doing satisfactorily with treatment.  PLAN: We will continue with the patient's radiation treatment as planned.

## 2016-12-17 NOTE — Progress Notes (Signed)
Weekly rad txs left breast, subclavicular , top of back left shoulder and breast erythema and dryness on shoulder, occasional shrp pains in breast, appetite good,  Gave 3rd tube radiaplex gel 2:10 PM BP 116/66 (BP Location: Right Arm, Patient Position: Sitting, Cuff Size: Normal)   Pulse 76   Temp 98.3 F (36.8 C) (Oral)   Resp 16   Wt 172 lb 3.2 oz (78.1 kg)   BMI 29.56 kg/m   Wt Readings from Last 3 Encounters:  12/17/16 172 lb 3.2 oz (78.1 kg)  12/13/16 170 lb 12.8 oz (77.5 kg)  12/10/16 171 lb (77.6 kg)

## 2016-12-20 ENCOUNTER — Ambulatory Visit
Admission: RE | Admit: 2016-12-20 | Discharge: 2016-12-20 | Disposition: A | Discharge status: Home / Self Care | Payer: Managed Care, Other (non HMO) | Source: Ambulatory Visit | Attending: Radiation Oncology | Admitting: Radiation Oncology

## 2016-12-20 DIAGNOSIS — Z510 Encounter for antineoplastic radiation therapy: Secondary | ICD-10-CM | POA: Diagnosis not present

## 2016-12-21 ENCOUNTER — Ambulatory Visit
Admission: RE | Admit: 2016-12-21 | Discharge: 2016-12-21 | Disposition: A | Discharge status: Home / Self Care | Payer: Managed Care, Other (non HMO) | Source: Ambulatory Visit | Attending: Radiation Oncology | Admitting: Radiation Oncology

## 2016-12-21 DIAGNOSIS — Z510 Encounter for antineoplastic radiation therapy: Secondary | ICD-10-CM | POA: Diagnosis not present

## 2016-12-22 ENCOUNTER — Ambulatory Visit
Admission: RE | Admit: 2016-12-22 | Discharge: 2016-12-22 | Disposition: A | Discharge status: Home / Self Care | Payer: Managed Care, Other (non HMO) | Source: Ambulatory Visit | Attending: Radiation Oncology | Admitting: Radiation Oncology

## 2016-12-22 DIAGNOSIS — Z510 Encounter for antineoplastic radiation therapy: Secondary | ICD-10-CM | POA: Diagnosis not present

## 2016-12-23 ENCOUNTER — Ambulatory Visit
Admission: RE | Admit: 2016-12-23 | Discharge: 2016-12-23 | Disposition: A | Discharge status: Home / Self Care | Payer: Managed Care, Other (non HMO) | Source: Ambulatory Visit | Attending: Radiation Oncology | Admitting: Radiation Oncology

## 2016-12-23 DIAGNOSIS — Z510 Encounter for antineoplastic radiation therapy: Secondary | ICD-10-CM | POA: Diagnosis not present

## 2016-12-24 ENCOUNTER — Ambulatory Visit (HOSPITAL_BASED_OUTPATIENT_CLINIC_OR_DEPARTMENT_OTHER): Payer: Managed Care, Other (non HMO)

## 2016-12-24 ENCOUNTER — Telehealth: Payer: Self-pay | Admitting: Hematology

## 2016-12-24 ENCOUNTER — Ambulatory Visit
Admission: RE | Admit: 2016-12-24 | Discharge: 2016-12-24 | Disposition: A | Discharge status: Home / Self Care | Payer: Managed Care, Other (non HMO) | Source: Ambulatory Visit | Attending: Radiation Oncology | Admitting: Radiation Oncology

## 2016-12-24 ENCOUNTER — Telehealth: Payer: Self-pay | Admitting: *Deleted

## 2016-12-24 ENCOUNTER — Encounter: Payer: Self-pay | Admitting: Radiation Oncology

## 2016-12-24 VITALS — BP 150/67 | HR 99 | Temp 99.0°F | Resp 18

## 2016-12-24 DIAGNOSIS — Z170 Estrogen receptor positive status [ER+]: Principal | ICD-10-CM

## 2016-12-24 DIAGNOSIS — C50412 Malignant neoplasm of upper-outer quadrant of left female breast: Secondary | ICD-10-CM

## 2016-12-24 DIAGNOSIS — Z5111 Encounter for antineoplastic chemotherapy: Secondary | ICD-10-CM | POA: Diagnosis not present

## 2016-12-24 DIAGNOSIS — C773 Secondary and unspecified malignant neoplasm of axilla and upper limb lymph nodes: Secondary | ICD-10-CM

## 2016-12-24 DIAGNOSIS — Z510 Encounter for antineoplastic radiation therapy: Secondary | ICD-10-CM | POA: Diagnosis not present

## 2016-12-24 MED ORDER — GOSERELIN ACETATE 3.6 MG ~~LOC~~ IMPL
3.6000 mg | DRUG_IMPLANT | Freq: Once | SUBCUTANEOUS | Status: AC
  Administered 2016-12-24: 3.6 mg via SUBCUTANEOUS
  Filled 2016-12-24: qty 3.6

## 2016-12-24 NOTE — Progress Notes (Signed)
   Department of Radiation Oncology  Phone:  [phone] Fax:        [phone]  Weekly Treatment Note    Name: Gloria Smith Date: 12/24/2016 MRN: [id number] DOB: Aug 14, 1971   Diagnosis:     ICD-9-CM ICD-10-CM   1. Malignant neoplasm of upper-outer quadrant of left breast in female, estrogen receptor positive ([place]) 174.4 C50.412    V86.0 Z17.0      Current dose: 60.4 Gy  Current fraction: 33   MEDICATIONS: Current Outpatient Prescriptions  Medication Sig Dispense Refill  . non-metallic deodorant (ALRA) MISC Apply 1 application topically daily as needed.    Marland Kitchen exemestane (AROMASIN) 25 MG tablet Take 1 tablet (25 mg total) by mouth daily after breakfast. (Patient not taking: Reported on 12/24/2016) 30 tablet 2  . tamoxifen (NOLVADEX) 20 MG tablet Take 20 mg by mouth daily.  0   No current facility-administered medications for this encounter.      ALLERGIES: Patient has no known allergies.   LABORATORY DATA:  Lab Results  Component Value Date   WBC 9.8 12/13/2016   HGB 13.6 12/13/2016   HCT 41.2 12/13/2016   MCV 89.2 12/13/2016   PLT 402 (H) 12/13/2016   Lab Results  Component Value Date   NA 141 12/13/2016   K 4.1 12/13/2016   CL 106 10/01/2016   CO2 29 12/13/2016   Lab Results  Component Value Date   ALT 30 12/13/2016   AST 18 12/13/2016   ALKPHOS 121 12/13/2016   BILITOT 0.40 12/13/2016     NARRATIVE: Gloria Smith was seen today for weekly treatment management. The chart was checked and the patient's films were reviewed.  The patient has done well. She has finished her final fraction of radiation treatment. No major changes in health skin feels with some improvement in the supraclavicular region.  Weekly radtx left breast 33/33 completed, follow up 02/16/17 with Shona Simpson, PA, bright erythema, supraclavicular and under inframmary fold, skin starting to peel will use neospotin on those two areas, radiaplex bid,  Appetite good 1:49  PM There were no vitals taken for this visit.  Wt Readings from Last 3 Encounters:  12/17/16 172 lb 3.2 oz (78.1 kg)  12/13/16 170 lb 12.8 oz (77.5 kg)  12/10/16 171 lb (77.6 kg)    PHYSICAL EXAMINATION: vitals were not taken for this visit.     Erythema present in the treatment area with early dry desquamation. No moist desquamation present.  ASSESSMENT: The patient is doing satisfactorily with treatment. The patient finished her final fraction today. She is very happy about being done with her course of treatment.  PLAN: Follow-up in approximately one month.

## 2016-12-24 NOTE — Telephone Encounter (Signed)
  Oncology Nurse Navigator Documentation  Navigator Location: [facility] (12/24/16 1400)   )Navigator Encounter Type: Telephone (12/24/16 1400) Telephone: [place] Call (12/24/16 1400)     Surgery Date: 10/05/16 (12/24/16 1400)           Treatment Initiated Date: 09/15/16 (12/24/16 1400) Patient Visit Type: RadOnc (12/24/16 1400) Treatment Phase: Final Radiation Tx (12/24/16 1400)                            Time Spent with Patient: 15 (12/24/16 1400)

## 2016-12-24 NOTE — Progress Notes (Signed)
Weekly radtx left breast 33/33 completed, follow up 02/16/17 with Shona Simpson, PA, bright erythema, supraclavicular and under inframmary fold, skin starting to peel will use neospotin on those two areas, radiaplex bid,  Appetite good 1:35 PM There were no vitals taken for this visit.  Wt Readings from Last 3 Encounters:  12/17/16 172 lb 3.2 oz (78.1 kg)  12/13/16 170 lb 12.8 oz (77.5 kg)  12/10/16 171 lb (77.6 kg)

## 2016-12-24 NOTE — Telephone Encounter (Signed)
sch scp appt for May per LOS

## 2016-12-24 NOTE — Patient Instructions (Signed)
Goserelin injection What is this medicine? GOSERELIN (GOE se rel in) is similar to a hormone found in the body. It lowers the amount of sex hormones that the body makes. Men will have lower testosterone levels and women will have lower estrogen levels while taking this medicine. In men, this medicine is used to treat prostate cancer; the injection is either given once per month or once every 12 weeks. A once per month injection (only) is used to treat women with endometriosis, dysfunctional uterine bleeding, or advanced breast cancer. This medicine may be used for other purposes; ask your health care provider or pharmacist if you have questions. COMMON BRAND NAME(S): Zoladex What should I tell my health care provider before I take this medicine? They need to know if you have any of these conditions (some only apply to women): -diabetes -heart disease or previous heart attack -high blood pressure -high cholesterol -kidney disease -osteoporosis or low bone density -problems passing urine -spinal cord injury -stroke -tobacco smoker -an unusual or allergic reaction to goserelin, hormone therapy, other medicines, foods, dyes, or preservatives -pregnant or trying to get pregnant -breast-feeding How should I use this medicine? This medicine is for injection under the skin. It is given by a health care professional in a hospital or clinic setting. Men receive this injection once every 4 weeks or once every 12 weeks. Women will only receive the once every 4 weeks injection. Talk to your pediatrician regarding the use of this medicine in children. Special care may be needed. Overdosage: If you think you have taken too much of this medicine contact a poison control center or emergency room at once. NOTE: This medicine is only for you. Do not share this medicine with others. What if I miss a dose? It is important not to miss your dose. Call your doctor or health care professional if you are unable to  keep an appointment. What may interact with this medicine? -female hormones like estrogen -herbal or dietary supplements like black cohosh, chasteberry, or DHEA -female hormones like testosterone -prasterone This list may not describe all possible interactions. Give your health care provider a list of all the medicines, herbs, non-prescription drugs, or dietary supplements you use. Also tell them if you smoke, drink alcohol, or use illegal drugs. Some items may interact with your medicine. What should I watch for while using this medicine? Visit your doctor or health care professional for regular checks on your progress. Your symptoms may appear to get worse during the first weeks of this therapy. Tell your doctor or healthcare professional if your symptoms do not start to get better or if they get worse after this time. Your bones may get weaker if you take this medicine for a long time. If you smoke or frequently drink alcohol you may increase your risk of bone loss. A family history of osteoporosis, chronic use of drugs for seizures (convulsions), or corticosteroids can also increase your risk of bone loss. Talk to your doctor about how to keep your bones strong. This medicine should stop regular monthly menstration in women. Tell your doctor if you continue to menstrate. Women should not become pregnant while taking this medicine or for 12 weeks after stopping this medicine. Women should inform their doctor if they wish to become pregnant or think they might be pregnant. There is a potential for serious side effects to an unborn child. Talk to your health care professional or pharmacist for more information. Do not breast-feed an infant while taking   this medicine. Men should inform their doctors if they wish to father a child. This medicine may lower sperm counts. Talk to your health care professional or pharmacist for more information. What side effects may I notice from receiving this  medicine? Side effects that you should report to your doctor or health care professional as soon as possible: -allergic reactions like skin rash, itching or hives, swelling of the face, lips, or tongue -bone pain -breathing problems -changes in vision -chest pain -feeling faint or lightheaded, falls -fever, chills -pain, swelling, warmth in the leg -pain, tingling, numbness in the hands or feet -signs and symptoms of low blood pressure like dizziness; feeling faint or lightheaded, falls; unusually weak or tired -stomach pain -swelling of the ankles, feet, hands -trouble passing urine or change in the amount of urine -unusually high or low blood pressure -unusually weak or tired Side effects that usually do not require medical attention (report to your doctor or health care professional if they continue or are bothersome): -change in sex drive or performance -changes in breast size in both males and females -changes in emotions or moods -headache -hot flashes -irritation at site where injected -loss of appetite -skin problems like acne, dry skin -vaginal dryness This list may not describe all possible side effects. Call your doctor for medical advice about side effects. You may report side effects to FDA at 1-800-FDA-1088. Where should I keep my medicine? This drug is given in a hospital or clinic and will not be stored at home. NOTE: This sheet is a summary. It may not cover all possible information. If you have questions about this medicine, talk to your doctor, pharmacist, or health care provider.  2017 Elsevier/Gold Standard (2014-01-15 11:10:35)  

## 2016-12-30 NOTE — Progress Notes (Signed)
  Radiation Oncology         [phone]) [phone] ________________________________  Name: Gloria Smith MRN: [id number]  Date: 12/24/2016  DOB: 1971-09-02  End of Treatment Note  Diagnosis:   46 year-old woman with Stage IIA, pT1bN1aMx, ER/PR positive invasive ductal carcinoma of the left breast.     Indication for treatment:  Curative       Radiation treatment dates:   11/08/2016 to 12/24/2016  Site/dose:    1. The Left breast was treated to 50.4 Gy in 28 fractions of 1.8 Gy per fraction. 2. The Left axilla was treated to 50.4 Gy in 28 fractions of 1.8 Gy per fraction. 3. The Left breast was boosted to 10 Gy in 5 fractions of 2 Gy per fraction.   Beams/energy:    1. 3D // 6X 2. 3D // 10X 3. Isodose plan // 10X, 6X  Narrative: The patient tolerated radiation treatment relatively well.   The patient developed erythema in the treatment area with early dry desquamation and no moist desquamation present.   Plan: The patient has completed radiation treatment. The patient will return to radiation oncology clinic for routine followup in one month. I advised them to call or return sooner if they have any questions or concerns related to their recovery or treatment.  ------------------------------------------------  Jodelle Gross, MD, PhD  This document serves as a record of services personally performed by Kyung Rudd, MD. It was created on his behalf by Arlyce Harman, a trained medical scribe. The creation of this record is based on the scribe's personal observations and the provider's statements to them. This document has been checked and approved by the attending provider.

## 2017-01-21 ENCOUNTER — Ambulatory Visit: Payer: Managed Care, Other (non HMO)

## 2017-01-21 ENCOUNTER — Telehealth: Payer: Self-pay | Admitting: *Deleted

## 2017-01-21 NOTE — Telephone Encounter (Signed)
-----   Message from Truitt Merle, MD sent at 01/21/2017 12:54 PM EST ----- Gloria Smith,  Please call her and reschedule her injection asap, thanks   Krista Blue  ----- Message ----- From: Patton Salles, RN Sent: 01/21/2017  12:43 PM To: Jarvis Morgan, RN, Truitt Merle, MD  Gloria Smith was a "no show" today for Zoladex

## 2017-01-21 NOTE — Telephone Encounter (Signed)
Pt was NO SHOW today.  Called pt and left message on voice mail requesting a call back to reschedule missed injection appt today.

## 2017-01-30 ENCOUNTER — Telehealth: Payer: Self-pay | Admitting: Hematology

## 2017-01-30 NOTE — Telephone Encounter (Signed)
Lvm advising 3/30 appt moved to 3/29 @ 12.45 due to md pal.

## 2017-02-02 NOTE — Progress Notes (Addendum)
Gloria Smith 46 year-old woman with Stage IIA, pT1bN1aMx, ER/PR positive invasive ductal carcinoma of the left breast radiation completed 12-24-16 one month FU.  Skin status:Left breast with normal color,nipple is still tender still get a sharp pain in her breast. What lotion are you using? Using a lotion with vitamin E. Have you seen med onc? If not, when is appointment: 12-13-16 Dr. Burr Medico If they are ER+, have they started Al or Tamoxifen? If not, why? 01-19-17 Aromasin Discuss survivorship appointment. 04-07-17 at [facility] Have you had a mammogram scheduled? Not scheduled yet Offer referral to [facility]. Will receive at the survivorship appointment. 04-07-17 Oncotype Dx. Score:N/A                                    Mammaprint showed low risk luminal A type, with average 10 year risk of recurrence untreated 10% Appetite:Good eating three meals per day  Pain:None Fatigue:None Arm mobility:Raising left arm without difficulty. Lymphedema: None 12-13-16 Saw Dr. Burr Medico   Zoladex 3.6 mg since 11/01/2016 (once a month) BP (!) 113/59   Pulse 90   Temp 98.2 F (36.8 C) (Oral)   Resp 18   Ht 5\' 4"  (1.626 m)   Wt 170 lb 6.4 oz (77.3 kg)   SpO2 98%   BMI 29.25 kg/m

## 2017-02-15 NOTE — Progress Notes (Signed)
[facility]  [phone] [phone]  Clinic Follow Up Note   Patient Care Team: Aloha Gell, MD as PCP - General (Obstetrics and Gynecology) Kyung Rudd, MD as Consulting Physician (Radiation Oncology) Truitt Merle, MD as Consulting Physician (Hematology) Rolm Bookbinder, MD as Consulting Physician (General Surgery) 02/17/2017  CHIEF COMPLAINTS:  Follow up left breast cancer  Oncology History   Breast cancer of upper-outer quadrant of left female breast [employer])   Staging form: Breast, AJCC 7th Edition   - Clinical stage from 09/06/2016: Stage IIA (T1b, N1, M0) - Signed by Truitt Merle, MD on 09/15/2016   - Pathologic stage from 10/05/2016: Stage IIA (T1b, N1a, cM0) - Signed by Truitt Merle, MD on 10/24/2016      Breast cancer of upper-outer quadrant of left female breast ([place])   09/06/2016 Initial Diagnosis    Malignant neoplasm of upper-outer quadrant of right female breast ([place])      09/06/2016 Initial Biopsy    Left breast 3:00 position showed invasive ductal carcinoma and DCIS, left axillary lymph node biopsy showed metastatic carcinoma with extracapsular extension      09/06/2016 Receptors her2    Breast tumor ER 100% positive, PR 100% positive, HER-2 negative, Ki-67 5%      09/06/2016 Mammogram    Diagnostic mammogram and ultrasound showed a 1.0 cm mass at the 3 clock position of left breast, and the enlarged left axillary node with cortical thickening. The 72m cm oval cyst in the right breast is likely benign.      09/06/2016 Miscellaneous    Mammaprint showed low risk luminal A type, with average 10 year risk of recurrence untreated 10%.      10/05/2016 Surgery    Left breast lumpectomy and SLN biopsy       10/05/2016 Pathology Results    Left breast lumpectomy showed invasive and in situ ductal carcinoma, 1 cm, grade 1, margins are negative, 2 sentinel lymph nodes are positive for metastatic carcinoma with extracapsular extension.  LVI (+)      11/08/2016 -  Radiation Therapy     Adjuvant radiation to the left breast and left axilla by Dr. MLisbeth Renshaw       HISTORY OF PRESENTING ILLNESS:  Gloria DARIUS46y.o. female is here because of her recently diagnosed left breast cancer. She presents to our multidisciplinary breast clinic with her husband.    She had screening mammogram on 09/01/2016 at her gynecologist office, which showed a bilateral breast nodules. Her previous mammogram was 4 years ago which was normal. She denies any significant symptoms, except occasional mild left breast shooting pain for the past few months, no palpable mass, skin change or nipple discharge. She feels well overall. She gained about 20lbs in the past 6 months   [facility] which showed a benign 892mcyst in the right breast, and a 1 cm mass in the 3:00 position of left breast, and I enlarged left axillary node. Both the left breast mass and axillary node biopsy showed invasive ductal carcinoma, ER/PR strongly positive, HER-2 negative.  GYN HISTORY  Menarchal: 12 LMP: spotting 08/2016, she has IUD (LStacie AcresContraceptive:  HRT: n/a G4P1: she has 1930o son   CURRENT THERAPY: 1. Zoladex 3.6 mg since 11/01/2016 (once a month).  2. Exemestane 25 MG, started Feb 2018, but forgot to take for a month, restarted on 02/13/2017   INTERIM HISTORY:  The patient returns for follow  She states things have been going good and she has been busy. She moved last month. She says she realized yesterday that she missed her last Zoladex injection for the beginning of March. She started Exemestane in Feb and started back last week, she notes no side effects.  ° ° °MEDICAL HISTORY:  °Past Medical History:  °Diagnosis Date  °• Headache   ° ocular migraines - very rare  °• Malignant neoplasm of upper-outer quadrant of right female breast ([facility]) 09/10/2016  ° pt states that cancer is on left breast, not  right  °• Urinary frequency   ° ° °SURGICAL HISTORY: °Past Surgical History:  °Procedure Laterality Date  °• BREAST LUMPECTOMY WITH RADIOACTIVE SEED AND SENTINEL LYMPH NODE BIOPSY Left 10/05/2016  ° Procedure: LEFT BREAST RADIOACTIVE SEED GUIDED LUMPECTOMY, LEFT AXILLARY RADIOACTIVE SEED GUIDED NODE EXCISION, LEFT AXILLARY SENTINEL LYMPH NODE BIOPSY(LEFT TARGETED AXILLARY DISSECTION);  Surgeon: Matthew Wakefield, MD;  Location: [facility] OR;  Service: General;  Laterality: Left;  °• DILATION AND CURETTAGE OF UTERUS    °• right knee surgery at age of 18     ° ° °SOCIAL HISTORY: °Social History  ° °Social History  °• Marital status: Single  °  Spouse name: N/A  °• Number of children: N/A  °• Years of education: N/A  ° °Occupational History  °• Not on file.  ° °Social History Main Topics  °• Smoking status: Former Smoker  °  Packs/day: 0.50  °  Years: 30.00  °  Quit date: 08/22/2016  °• Smokeless tobacco: Never Used  °• Alcohol use 0.6 oz/week  °  1 Glasses of wine per week  °   Comment: 6 drinks wine or beer a week   °• Drug use: No  °• Sexual activity: Not on file  ° °Other Topics Concern  °• Not on file  ° °Social History Narrative  °• No narrative on file  ° °She works as a finance analysist, works from home  ° °FAMILY HISTORY: °Family History  °Problem Relation Age of Onset  °• Colon cancer Paternal Grandmother 95  °  d. 96  °• Breast cancer Cousin   °  paternal 1st cousin, once-removed; dx. 40s  °• Other Mother   °  hx precancerous skin finding removed from eyelid at 70-71  °• Heart attack Maternal Grandmother 70  °• Stroke Maternal Grandfather   °• Heart attack Paternal Grandfather 77  °• Other Other 25  °  hx of precancerous finding on abnormal pap smear  °• Cancer Other   °  maternal great aunt (MGM's sister) d. early 70s; spinal cancer, but this may have been a secondary cancer w/ NOS primary  °• Cancer Other   °  maternal great grandmother (MGM's mother) d. 40s w/ NOS cancer  °• Cancer Other   °  paternal great  uncle (PGM's brother) d. 80s w/ NOS cancer  ° ° °ALLERGIES:  has No Known Allergies. ° °MEDICATIONS:  °Current Outpatient Prescriptions  °Medication Sig Dispense Refill  °• exemestane (AROMASIN) 25 MG tablet Take 1 tablet (25 mg total) by mouth daily after breakfast. 30 tablet 2  ° °No current facility-administered medications for this visit.   ° ° °REVIEW OF SYSTEMS:   °Constitutional: Denies fevers, chills or abnormal night sweats  °Eyes: Denies blurriness of vision, double vision or watery eyes °Ears, nose, mouth, throat, and face: Denies mucositis or sore throat °Respiratory: Denies cough, dyspnea or wheezes °Cardiovascular: Denies palpitation, chest discomfort or lower extremity swelling °  Gastrointestinal:  Denies nausea, heartburn or change in bowel habits °Skin: Denies abnormal skin rashes °Lymphatics: Denies new lymphadenopathy or easy bruising °Neurological:Denies numbness, tingling or new weaknesses °Behavioral/Psych: Mood is stable, no new changes  °All other systems were reviewed with the patient and are negative. ° °PHYSICAL EXAMINATION:  °ECOG PERFORMANCE STATUS: 0 - Asymptomatic ° °Vitals:  ° 02/17/17 1330  °BP: 107/67  °Pulse: 84  °Resp: 18  °Temp: 98 °F (36.7 °C)  ° °Filed Weights  ° 02/17/17 1330  °Weight: 170 lb (77.1 kg)  ° ° °GENERAL:alert, no distress and comfortable °SKIN: skin color, texture, turgor are normal, no rashes or significant lesions °EYES: normal, conjunctiva are pink and non-injected, sclera clear °OROPHARYNX:no exudate, no erythema and lips, buccal mucosa, and tongue normal  °NECK: supple, thyroid normal size, non-tender, without nodularity °LYMPH:  no palpable lymphadenopathy in the cervical, axillary or inguinal °LUNGS: clear to auscultation and percussion with normal breathing effort °HEART: regular rate & rhythm and no murmurs and no lower extremity edema °ABDOMEN:abdomen soft, non-tender and normal bowel sounds °Musculoskeletal:no cyanosis of digits and no clubbing  °PSYCH:  alert & oriented x 3 with fluent speech °NEURO: no focal motor/sensory deficits °Breasts: Diffuse skin erythema of the left breast from radiation, no ulcers or discharge. Small scar behind the older incision in the left breast UOQ. No other palpable breast mass or adenopathy. ° °LABORATORY DATA:  °I have reviewed the data as listed °CBC Latest Ref Rng & Units 02/17/2017 12/13/2016 10/01/2016  °WBC 3.9 - 10.3 10e3/uL 10.9(H) 9.8 10.8(H)  °Hemoglobin 11.6 - 15.9 g/dL 14.0 13.6 13.5  °Hematocrit 34.8 - 46.6 % 43.1 41.2 41.7  °Platelets 145 - 400 10e3/uL 409(H) 402(H) 464(H)  ° °CMP Latest Ref Rng & Units 02/17/2017 12/13/2016 10/01/2016  °Glucose 70 - 140 mg/dl 92 81 92  °BUN 7.0 - 26.0 mg/dL 13.1 11.9 14  °Creatinine 0.6 - 1.1 mg/dL 0.8 0.8 0.93  °Sodium 136 - 145 mEq/L 138 141 139  °Potassium 3.5 - 5.1 mEq/L 4.6 4.1 4.2  °Chloride 101 - 111 mmol/L - - 106  °CO2 22 - 29 mEq/L 25 29 27  °Calcium 8.4 - 10.4 mg/dL 9.5 9.6 9.2  °Total Protein 6.4 - 8.3 g/dL 7.3 6.9 -  °Total Bilirubin 0.20 - 1.20 mg/dL 0.24 0.40 -  °Alkaline Phos 40 - 150 U/L 135 121 -  °AST 5 - 34 U/L 14 18 -  °ALT 0 - 55 U/L 21 30 -  ° ° ° °PATHOLOGY REPORT  °Diagnosis 09/06/2016 °1. Breast, left, needle core biopsy, 3 o'clock °- INVASIVE DUCTAL CARCINOMA. °- DUCTAL CARCINOMA IN SITU.  °- SEE COMMENT. °2. Lymph node, axilla and upper limb, left °- METASTATIC CARCINOMA IN 1 OF 1 LYMPH NODE (1/1), WITH EXTRACAPSULAR EXTENSION. °Microscopic Comment °1. The carcinoma appears grade 2. A breast prognostic profile will be performed and the results reported separately. °The results were called to [facility] on 09/07/16. (JBK:gt, 09/07/16) °1. PROGNOSTIC INDICATORS °Results: °IMMUNOHISTOCHEMICAL AND MORPHOMETRIC ANALYSIS PERFORMED MANUALLY °Estrogen Receptor: 100%, POSITIVE, STRONG STAINING INTENSITY °Progesterone Receptor: 100%, POSITIVE, STRONG STAINING INTENSITY °Proliferation Marker Ki67: 5% ° °Results: °HER2 - NEGATIVE °RATIO OF HER2/CEP17  SIGNALS 1.46 °AVERAGE HER2 COPY NUMBER PER CELL 3.00 ° °Diagnosis 10/05/2016 °1. Breast, lumpectomy, Left °- INVASIVE AND IN SITU DUCTAL CARCINOMA, 1 CM. °- MARGINS NOT INVOLVED. °- FIBROCYSTIC CHANGES. °2. Lymph node, sentinel, biopsy, Left #1 °- METASTATIC CARCINOMA IN ONE LYMPH NODE WITH EXTRACAPSULAR EXTENSION (1/1). °3. Lymph node, sentinel,   biopsy, Left #2 °- METASTATIC CARCINOMA IN ONE LYMPH NODE WITH EXTRACAPSULAR EXTENSION (1/1). °Microscopic Comment °1. BREAST, INVASIVE TUMOR, WITH LYMPH NODES PRESENT °Specimen, including laterality and lymph node sampling (sentinel, non-sentinel): Left breast with two sentinel °lymph nodes °Procedure: Localized lumpectomy and sentinel lymph node biopsies. °Histologic type: Ductal °Grade: 1 °Tubule formation: 1 °Nuclear pleomorphism: 2 °Mitotic: 1 °Tumor size (gross measurement) 1 cm °Margins: Free of tumor °Invasive, distance to closest margin: 0.3 cm from anterior margin °In-situ, distance to closest margin: 0.2 cm from anterior margin °If margin positive, focally or broadly: N/A °Lymphovascular invasion: Present °Ductal carcinoma in situ: Present °Grade: Low grade °Extensive intraductal component: No °Lobular neoplasia: No °Tumor focality: Unifocal °Extent of tumor: °1 of 3 °FINAL for Spitzley, Ragna M ([id number]) °Microscopic Comment(continued) °Skin: N/A °Nipple: N/A °Skeletal muscle: N/A °Lymph nodes: °Examined: 2 Sentinel °0 Non-sentinel °2 Total °Lymph nodes with metastasis: 2 °Isolated tumor cells (< 0.2 mm): 0 °Micrometastasis: (> 0.2 mm and < 2.0 mm): 0 °Macrometastasis: (> 2.0 mm): 2 °Extracapsular extension: Present, both nodes °Breast prognostic profile: Case [id number] °Estrogen receptor: 100% and 95%, positive °Progesterone receptor: 100% and 100%, positive °Her 2 neu: Negative, ratio is 1.46 and 1.45 °Ki-67: 5% and 10% °Non-neoplastic breast: Fibrocystic changes °TNM: pT1b, pN1a, pMX °(JDP:kh 10-06-16) °JOHN PATRICK MD °Pathologist, Electronic  Signature °(Case signed 10/06/2016) ° ° °Mammaprint 09/06/2016  ° ° ° ° °RADIOGRAPHIC STUDIES: °I have personally reviewed the radiological images as listed and agreed with the findings in the report. °No results found. ° °I have reviewed her outside mammogram and ultrasound results. ° °ASSESSMENT & PLAN:  °45 y.o. pre-or perimenopausal woman, presented with screening discovered left breast mass and axilla adenopathy. ° °1. Breast cancer of upper-outer quadrant of left female breast, pT1bN1aM0 stage IIA, G1, ER+/PR+/HER2-, Ki67 5% °-I previously discussed her surgical pathology results with the patient in details. °-She has had a complete surgical resections, margins were negative, she had 2 positive sentinel lymph nodes with extracapsular extension, (+) LVI which are risk for recurrence.  °-We did mammaprint on her initial biopsy tumor tissue, which showed low risk disease, luminal type A. Her risk of recurrence in 10 years without adjuvant therapy is 10%. Her 5 year distant metastasis free survival is 97.8% with adjuvant antiestrogen therapy. Based on the mammaprint results, I do not recommend adjuvant chemotherapy. °-Giving the strong ER and PR expression in her tumor and her pre or perimenopausal status, I recommend adjuvant endocrine therapy with tamoxifen for 10 years to reduce the risk of cancer recurrence. She started before surgery, and tolerated well.  °-We discussed the alternative endocrine therapy with aromatase inhibitor and over suppression, due to her young age, and stage II a node positive disease, I think she would benefit more from OS+AI then tamoxifen. The potential benefit and side effects of aromatase inhibitor, which includes but not limited to, hot flashes, vaginal and skin dryness, metabolic impact on her cholesterol, blood glucose, mood swing, weight gain, osteoporosis, etc., were discussed with her in details, she is interested. I plan to start her on exemestane 2 weeks after she completes  radiation. She has baseline mild arthritis, if she has severe arthralgia from AI, I'll switch her back to tamoxifen. °--The patient started Zoladex 3.6 mg on 11/01/16 (once a month) and will continue. She is interested in BSO, she will discuss with her gynecologist Dr. Fogelman in the next few months. °- She has completed radiation and her skin has healed well °- She has started Exemestane   started Exemestane and is tolerating well. We will continue - Mammogram once yearly - Reviewed labs, they look good today. - injections every 4 weeks until she has ovaries removed -Continue breast cancer surveillance, next mammogram due in July.  2. Genetics  -Giving her young age and positive family history of breast cancer, she'll be referred to genetic counselor for genetic testing, to ruled out inheritable breast cancer syndrome. -Negative for known pathogenic mutations within any of 20 genes on the Breast/Ovarian Cancer Panel through [facility] [employer] [facility].  One variant of uncertain significance (VUS) called "c.3571A>G (p.Ile1191Val)" was found in one copy of the BRIP1 gene.  The Breast/Ovarian Cancer Panel offered by [facility] Hope Pigeon, MD) includes sequencing and deletion/duplication analysis for the following 19 genes:  ATM, BARD1, BRCA1, BRCA2, BRIP1, CDH1, CHEK2, FANCC, MLH1, MSH2, MSH6, NBN, PALB2, PMS2, PTEN, RAD51C, RAD51D, TP53, and XRCC2.  This panel also includes deletion/duplication analysis (without sequencing) for one gene, EPCAM.    3. GYN -Patient is agreeable with oophorectomy, she had a question about hysterectomy. We discussed this is not standard recommendation unless she has other concerns. She will discuss with her gynecologist.   PLAN - continue exemestane  - f/u in 3 months -Zoladex  injections every 4 weeks    All questions were answered. The patient knows to call the clinic with any problems, questions or concerns. I spent 25 minutes counseling the patient face to face. The total time  spent in the appointment was 30 minutes and more than 50% was on counseling.     Truitt Merle, MD 02/17/2017   This document serves as a record of services personally performed by Truitt Merle, MD. It was created on her behalf by Brandt Loosen, a trained medical scribe. The creation of this record is based on the scribe's personal observations and the provider's statements to them. This document has been checked and approved by the attending provider.

## 2017-02-16 ENCOUNTER — Ambulatory Visit
Admission: RE | Admit: 2017-02-16 | Discharge: 2017-02-16 | Disposition: A | Discharge status: Home / Self Care | Payer: Managed Care, Other (non HMO) | Source: Ambulatory Visit | Attending: Radiation Oncology | Admitting: Radiation Oncology

## 2017-02-16 ENCOUNTER — Encounter: Payer: Self-pay | Admitting: Radiation Oncology

## 2017-02-16 VITALS — BP 113/59 | HR 90 | Temp 98.2°F | Resp 18 | Ht 64.0 in | Wt 170.4 lb

## 2017-02-16 DIAGNOSIS — C50412 Malignant neoplasm of upper-outer quadrant of left female breast: Secondary | ICD-10-CM

## 2017-02-16 DIAGNOSIS — C50912 Malignant neoplasm of unspecified site of left female breast: Secondary | ICD-10-CM | POA: Insufficient documentation

## 2017-02-16 DIAGNOSIS — Z170 Estrogen receptor positive status [ER+]: Secondary | ICD-10-CM | POA: Insufficient documentation

## 2017-02-16 NOTE — Progress Notes (Addendum)
Radiation Oncology         [id number]) [phone] ________________________________  Name: Gloria Smith MRN: [id number]  Date: 02/16/2017  DOB: 1971-02-28  Post Treatment Note  CC: Ala Dach., MD  Aloha Gell, MD  Diagnosis:  Stage IIA, T1bN1aMx, ER/PR positive invasive ductal carcinoma of the left breast  Interval Since Last Radiation:  8 weeks   11/08/2016 to 12/24/2016: 1. The Left breast was treated to 50.4 Gy in 28 fractions of 1.8 Gy per fraction. 2. The Left axilla was treated to 50.4 Gy in 28 fractions of 1.8 Gy per fraction. 3. The Left breast was boosted to 10 Gy in 5 fractions of 2 Gy per fraction.   Narrative:  The patient returns today for routine follow-up. During treatment she did very well with radiotherapy and did not have significant desquamation.  She started Aromasin with Dr. Burr Medico on 01/19/17.  She is planning to consider bilateral oophorectomy for definitive estrogen blockade.                        On review of systems, the patient states she is doing well overall. She reports normal pigment of her skin. She states she still has occasional sharp pain into the breast and nipple since surgery. She reports some tightness in her left axilla when removing her shirt. No pain is noted per se. She denies any upper extremity edema. No other complaints are noted.  ALLERGIES:  has No Known Allergies.  Meds: Current Outpatient Prescriptions  Medication Sig Dispense Refill  . exemestane (AROMASIN) 25 MG tablet Take 1 tablet (25 mg total) by mouth daily after breakfast. 30 tablet 2  . tamoxifen (NOLVADEX) 20 MG tablet Take 20 mg by mouth daily.  0   No current facility-administered medications for this encounter.     Physical Findings:  height is 5\' 4"  (1.626 m) and weight is 170 lb 6.4 oz (77.3 kg). Her oral temperature is 98.2 F (36.8 C). Her blood pressure is 113/59 (abnormal) and her pulse is 90. Her respiration is 18 and oxygen saturation is 98%.  Pain  Assessment Pain Score: 0-No pain/10 In general this is a well appearing Caucasian female in no acute distress. She's alert and oriented x4 and appropriate throughout the examination. Cardiopulmonary assessment is negative for acute distress and she exhibits normal effort. The left breast was examined and reveals no evidence of desquamation or significant change of skin pigment.   Lab Findings: Lab Results  Component Value Date   WBC 9.8 12/13/2016   HGB 13.6 12/13/2016   HCT 41.2 12/13/2016   MCV 89.2 12/13/2016   PLT 402 (H) 12/13/2016     Radiographic Findings: No results found.  Impression/Plan: 1. Stage IIA, T1bN1aMx, ER/PR positive invasive ductal carcinoma of the left breast. The patient has been doing well since completion of radiotherapy. We discussed that we would be happy to continue to follow her as needed, but she will also continue to follow up with Dr. Burr Medico in medical oncology. She was counseled on skin care as well as measures to avoid sun exposure to this area. She will also discuss referral to GYN Oncology with Dr. Burr Medico, to consider bilateral oophorectomy.  2. Survivorship. The patient is scheduled for survivorship in May with Mendel Ryder Cause, NP. We discussed the importance of this appointment and reviewed the rationale for post treatment survivorship care. 3. Left axillary tightness. The patient was offered evaluation with physical therapy and a referral will be  placed.     Carola Rhine, PAC

## 2017-02-16 NOTE — Addendum Note (Signed)
Encounter addended by: Malena Edman, RN on: 02/16/2017  3:59 PM<BR>    Actions taken: Charge Capture section accepted

## 2017-02-17 ENCOUNTER — Encounter: Payer: Self-pay | Admitting: Hematology

## 2017-02-17 ENCOUNTER — Telehealth: Payer: Self-pay | Admitting: Hematology

## 2017-02-17 ENCOUNTER — Ambulatory Visit (HOSPITAL_BASED_OUTPATIENT_CLINIC_OR_DEPARTMENT_OTHER): Payer: Managed Care, Other (non HMO) | Admitting: Hematology

## 2017-02-17 ENCOUNTER — Other Ambulatory Visit (HOSPITAL_BASED_OUTPATIENT_CLINIC_OR_DEPARTMENT_OTHER): Payer: Managed Care, Other (non HMO)

## 2017-02-17 ENCOUNTER — Ambulatory Visit (HOSPITAL_BASED_OUTPATIENT_CLINIC_OR_DEPARTMENT_OTHER): Payer: Managed Care, Other (non HMO)

## 2017-02-17 ENCOUNTER — Telehealth: Payer: Self-pay | Admitting: *Deleted

## 2017-02-17 VITALS — BP 107/67 | HR 84 | Temp 98.0°F | Resp 18 | Ht 64.0 in | Wt 170.0 lb

## 2017-02-17 DIAGNOSIS — C773 Secondary and unspecified malignant neoplasm of axilla and upper limb lymph nodes: Secondary | ICD-10-CM | POA: Diagnosis not present

## 2017-02-17 DIAGNOSIS — Z5111 Encounter for antineoplastic chemotherapy: Secondary | ICD-10-CM

## 2017-02-17 DIAGNOSIS — Z170 Estrogen receptor positive status [ER+]: Secondary | ICD-10-CM | POA: Diagnosis not present

## 2017-02-17 DIAGNOSIS — C50412 Malignant neoplasm of upper-outer quadrant of left female breast: Secondary | ICD-10-CM

## 2017-02-17 DIAGNOSIS — Z79811 Long term (current) use of aromatase inhibitors: Secondary | ICD-10-CM

## 2017-02-17 LAB — CBC WITH DIFFERENTIAL/PLATELET
BASO%: 0.4 % (ref 0.0–2.0)
Basophils Absolute: 0 10*3/uL (ref 0.0–0.1)
EOS%: 1.2 % (ref 0.0–7.0)
Eosinophils Absolute: 0.1 10*3/uL (ref 0.0–0.5)
HCT: 43.1 % (ref 34.8–46.6)
HGB: 14 g/dL (ref 11.6–15.9)
LYMPH%: 17.2 % (ref 14.0–49.7)
MCH: 28.9 pg (ref 25.1–34.0)
MCHC: 32.5 g/dL (ref 31.5–36.0)
MCV: 88.9 fL (ref 79.5–101.0)
MONO#: 0.8 10*3/uL (ref 0.1–0.9)
MONO%: 7.6 % (ref 0.0–14.0)
NEUT#: 8 10*3/uL — ABNORMAL HIGH (ref 1.5–6.5)
NEUT%: 73.6 % (ref 38.4–76.8)
PLATELETS: 409 10*3/uL — AB (ref 145–400)
RBC: 4.85 10*6/uL (ref 3.70–5.45)
RDW: 13.4 % (ref 11.2–14.5)
WBC: 10.9 10*3/uL — AB (ref 3.9–10.3)
lymph#: 1.9 10*3/uL (ref 0.9–3.3)

## 2017-02-17 LAB — COMPREHENSIVE METABOLIC PANEL
ALT: 21 U/L (ref 0–55)
ANION GAP: 9 meq/L (ref 3–11)
AST: 14 U/L (ref 5–34)
Albumin: 3.9 g/dL (ref 3.5–5.0)
Alkaline Phosphatase: 135 U/L (ref 40–150)
BUN: 13.1 mg/dL (ref 7.0–26.0)
CHLORIDE: 104 meq/L (ref 98–109)
CO2: 25 meq/L (ref 22–29)
CREATININE: 0.8 mg/dL (ref 0.6–1.1)
Calcium: 9.5 mg/dL (ref 8.4–10.4)
EGFR: >90 mL/min/{1.73_m2} (ref >90)
Glucose: 92 mg/dl (ref 70–140)
Potassium: 4.6 mEq/L (ref 3.5–5.1)
SODIUM: 138 meq/L (ref 136–145)
Total Bilirubin: 0.24 mg/dL (ref 0.20–1.20)
Total Protein: 7.3 g/dL (ref 6.4–8.3)

## 2017-02-17 MED ORDER — GOSERELIN ACETATE 3.6 MG ~~LOC~~ IMPL
3.6000 mg | DRUG_IMPLANT | Freq: Once | SUBCUTANEOUS | Status: AC
  Administered 2017-02-17: 3.6 mg via SUBCUTANEOUS
  Filled 2017-02-17: qty 3.6

## 2017-02-17 NOTE — Telephone Encounter (Signed)
Appointments scheduled per 3.29.18 LOS. Patient given AVS report and calendars with future scheduled appointments. °

## 2017-02-17 NOTE — Patient Instructions (Signed)
Goserelin injection What is this medicine? GOSERELIN (GOE se rel in) is similar to a hormone found in the body. It lowers the amount of sex hormones that the body makes. Men will have lower testosterone levels and women will have lower estrogen levels while taking this medicine. In men, this medicine is used to treat prostate cancer; the injection is either given once per month or once every 12 weeks. A once per month injection (only) is used to treat women with endometriosis, dysfunctional uterine bleeding, or advanced breast cancer. This medicine may be used for other purposes; ask your health care provider or pharmacist if you have questions. COMMON BRAND NAME(S): Zoladex What should I tell my health care provider before I take this medicine? They need to know if you have any of these conditions (some only apply to women): -diabetes -heart disease or previous heart attack -high blood pressure -high cholesterol -kidney disease -osteoporosis or low bone density -problems passing urine -spinal cord injury -stroke -tobacco smoker -an unusual or allergic reaction to goserelin, hormone therapy, other medicines, foods, dyes, or preservatives -pregnant or trying to get pregnant -breast-feeding How should I use this medicine? This medicine is for injection under the skin. It is given by a health care professional in a hospital or clinic setting. Men receive this injection once every 4 weeks or once every 12 weeks. Women will only receive the once every 4 weeks injection. Talk to your pediatrician regarding the use of this medicine in children. Special care may be needed. Overdosage: If you think you have taken too much of this medicine contact a poison control center or emergency room at once. NOTE: This medicine is only for you. Do not share this medicine with others. What if I miss a dose? It is important not to miss your dose. Call your doctor or health care professional if you are unable to  keep an appointment. What may interact with this medicine? -female hormones like estrogen -herbal or dietary supplements like black cohosh, chasteberry, or DHEA -female hormones like testosterone -prasterone This list may not describe all possible interactions. Give your health care provider a list of all the medicines, herbs, non-prescription drugs, or dietary supplements you use. Also tell them if you smoke, drink alcohol, or use illegal drugs. Some items may interact with your medicine. What should I watch for while using this medicine? Visit your doctor or health care professional for regular checks on your progress. Your symptoms may appear to get worse during the first weeks of this therapy. Tell your doctor or healthcare professional if your symptoms do not start to get better or if they get worse after this time. Your bones may get weaker if you take this medicine for a long time. If you smoke or frequently drink alcohol you may increase your risk of bone loss. A family history of osteoporosis, chronic use of drugs for seizures (convulsions), or corticosteroids can also increase your risk of bone loss. Talk to your doctor about how to keep your bones strong. This medicine should stop regular monthly menstration in women. Tell your doctor if you continue to menstrate. Women should not become pregnant while taking this medicine or for 12 weeks after stopping this medicine. Women should inform their doctor if they wish to become pregnant or think they might be pregnant. There is a potential for serious side effects to an unborn child. Talk to your health care professional or pharmacist for more information. Do not breast-feed an infant while taking   this medicine. Men should inform their doctors if they wish to father a child. This medicine may lower sperm counts. Talk to your health care professional or pharmacist for more information. What side effects may I notice from receiving this  medicine? Side effects that you should report to your doctor or health care professional as soon as possible: -allergic reactions like skin rash, itching or hives, swelling of the face, lips, or tongue -bone pain -breathing problems -changes in vision -chest pain -feeling faint or lightheaded, falls -fever, chills -pain, swelling, warmth in the leg -pain, tingling, numbness in the hands or feet -signs and symptoms of low blood pressure like dizziness; feeling faint or lightheaded, falls; unusually weak or tired -stomach pain -swelling of the ankles, feet, hands -trouble passing urine or change in the amount of urine -unusually high or low blood pressure -unusually weak or tired Side effects that usually do not require medical attention (report to your doctor or health care professional if they continue or are bothersome): -change in sex drive or performance -changes in breast size in both males and females -changes in emotions or moods -headache -hot flashes -irritation at site where injected -loss of appetite -skin problems like acne, dry skin -vaginal dryness This list may not describe all possible side effects. Call your doctor for medical advice about side effects. You may report side effects to FDA at 1-800-FDA-1088. Where should I keep my medicine? This drug is given in a hospital or clinic and will not be stored at home. NOTE: This sheet is a summary. It may not cover all possible information. If you have questions about this medicine, talk to your doctor, pharmacist, or health care provider.  2017 Elsevier/Gold Standard (2014-01-15 11:10:35)

## 2017-02-17 NOTE — Telephone Encounter (Signed)
CALLED PATIENT TO ASK IF SHE WOULD BE INTERESTED IN DOING HER PT TODAY 2 4 PM, LVM FOR A RETURN CALL

## 2017-02-18 ENCOUNTER — Ambulatory Visit: Payer: Managed Care, Other (non HMO) | Admitting: Hematology

## 2017-02-18 ENCOUNTER — Ambulatory Visit: Payer: Managed Care, Other (non HMO)

## 2017-02-18 ENCOUNTER — Other Ambulatory Visit: Payer: Managed Care, Other (non HMO)

## 2017-03-02 ENCOUNTER — Ambulatory Visit: Payer: Managed Care, Other (non HMO) | Attending: Radiation Oncology | Admitting: Physical Therapy

## 2017-03-02 DIAGNOSIS — M25512 Pain in left shoulder: Secondary | ICD-10-CM | POA: Insufficient documentation

## 2017-03-02 DIAGNOSIS — Z483 Aftercare following surgery for neoplasm: Secondary | ICD-10-CM | POA: Diagnosis present

## 2017-03-02 DIAGNOSIS — M25612 Stiffness of left shoulder, not elsewhere classified: Secondary | ICD-10-CM | POA: Diagnosis not present

## 2017-03-02 DIAGNOSIS — R293 Abnormal posture: Secondary | ICD-10-CM | POA: Diagnosis present

## 2017-03-02 DIAGNOSIS — L599 Disorder of the skin and subcutaneous tissue related to radiation, unspecified: Secondary | ICD-10-CM | POA: Diagnosis present

## 2017-03-02 NOTE — Patient Instructions (Signed)
1. Decompression Exercise     Cancer Rehab [id number] ° ° ° °Lie on back on firm surface, knees bent, feet flat, arms turned up, out to sides, backs of hands down. Time _5-15__ minutes. °Surface: floor  ° °2. Shoulder Press ° ° ° °Start in Decompression Exercise position. Press shoulders downward towards supporting surface. Hold __2-3__ seconds while counting out loud. °Repeat _3-5___ times. Do _1-2___ times per day. ° ° °3. Head Press ° ° ° °Bring cervical spine (neck) into neutral position (by either tucking the chin towards the chest or tilting the chin upward). Feel weight on back of head. Press head downward into supporting surface.    Hold _2-3__ seconds. °Repeat _3-5__ times. Do _1-2__ times per day. °  °4. Leg Lengthener ° ° ° °Straighten one leg. Pull toes AND forefoot toward knee, extend heel. Lengthen leg by pulling pelvis away from ribs. Hold _2-3__ seconds. Relax. Repeat __4-6__ times. Do other leg.  °Surface: floor  ° °5. Leg Press ° ° ° °Straighten one leg down to floor keeping leg aligned with hip. Pull toes AND forefoot toward knee; extend heel.  Press entire leg downward (as if pressing leg into sandy beach). DO NOT BEND KNEE. Hold _2-3__ seconds. Do __4-6__ times. Repeat with other leg.  ° °SHOULDER: Flexion - Supine (Cane)        Cancer Rehab [id number] ° ° ° °Hold cane in both hands. Raise arms up overhead. Do not allow back to arch. Hold _5__ seconds. Do __5-10__ times; __1-2__ times a day. ° ° °SELF ASSISTED WITH OBJECT: Shoulder Abduction / Adduction - Supine ° ° ° °Hold cane with both hands. Move both arms from side to side, keep elbows straight.  Hold when stretch felt for __5__ seconds. Repeat __5-10__ times; __1-2__ times a day. Once this becomes easier progress to third picture bringing affected arm towards ear by staying out to side. Same hold for _5_seconds. Repeat  _5-10_ times, _1-2_ times/day. ° °Shoulder Blade Stretch ° ° ° °Clasp fingers behind head with elbows touching in front of  face. Pull elbows back while pressing shoulder blades together. Relax and hold as tolerated, can place pillow under elbow here for comfort as needed and to allow for prolonged stretch.  °Repeat __5__ times. Do __1-2__ sessions per day. ° ° ° ° °SHOULDER: External Rotation - Supine (Cane) ° ° ° °Hold cane with both hands. Rotate arm away from body. Keep elbow on floor and next to body. _5-10__ reps per set, hold 5 seconds, _1-2__ sets per day. °Add towel to keep elbow at side. ° °Copyright © V[employer]. All rights reserved.  ° ° ° ° ° °

## 2017-03-02 NOTE — Therapy (Signed)
[place], [place], [address] Phone: [phone]   Fax:  [phone]  Physical Therapy Evaluation  Patient Details  Name: Gloria Smith MRN: [id number] Date of Birth: 06/26/1971 Referring Provider: Worthy Flank, PA-C   Encounter Date: 03/02/2017      PT End of Session - 03/02/17 0948    Visit Number 1   Number of Visits 5   Date for PT Re-Evaluation 04/01/17   PT Start Time 0850   PT Stop Time 0938   PT Time Calculation (min) 48 min   Activity Tolerance Patient tolerated treatment well   Behavior During Therapy [facility] for tasks assessed/performed      Past Medical History:  Diagnosis Date  . Headache    ocular migraines - very rare  . Malignant neoplasm of upper-outer quadrant of right female breast ([place]) 09/10/2016   pt states that cancer is on left breast, not right  . Urinary frequency     Past Surgical History:  Procedure Laterality Date  . BREAST LUMPECTOMY WITH RADIOACTIVE SEED AND SENTINEL LYMPH NODE BIOPSY Left 10/05/2016   Procedure: LEFT BREAST RADIOACTIVE SEED GUIDED LUMPECTOMY, LEFT AXILLARY RADIOACTIVE SEED GUIDED NODE EXCISION, LEFT AXILLARY SENTINEL LYMPH NODE BIOPSY(LEFT TARGETED AXILLARY DISSECTION);  Surgeon: Rolm Bookbinder, MD;  Location: [facility];  Service: General;  Laterality: Left;  . DILATION AND CURETTAGE OF UTERUS    . right knee surgery at age of 35       There were no vitals filed for this visit.       Subjective Assessment - 03/02/17 0856    Subjective Finished radiation in Feb and is coming back as her shoulder motion still gets 'stuck' occasionally    Pertinent History Patient was diagnosed on 09/01/16 with left invasive breast cancer.  It is ER/PR positive, HER2 negative, and has a Ki67 of 5%. It measures 1 cm and is located in the upper outer quadrant. She has a biopsied positive axillary lymph node.  She subsequently went on to surgery on 10/05/16 where  she had a left breast lumpectomy with seed guided node evaluation which revealed an grade 1 invasive and low grade in situ ductal carcinoma measuring 1 cm with negative margins. Of the two lymph nodes, both contained metastatic disease with extracapsular extension.  She finished radiation at the end of Feb   Patient Stated Goals to get better return of left shoulder motion    Currently in Pain? No/denies            [facility] PT Assessment - 03/02/17 0001      Assessment   Medical Diagnosis Left breast cancer   Referring Provider Worthy Flank, PA-C    Onset Date/Surgical Date 10/05/16   Hand Dominance Right   Prior Therapy none     Precautions   Precautions Other (comment)   Precaution Comments cancer precautions     Restrictions   Weight Bearing Restrictions No     Balance Screen   Has the patient fallen in the past 6 months No   Has the patient had a decrease in activity level because of a fear of falling?  No   Is the patient reluctant to leave their home because of a fear of falling?  No     Home Environment   Living Environment Private residence   Living Arrangements Spouse/significant other  Lives with her boyfriend   Type of [place] One level  Prior Function   Level of [place] Independent   Vocation Full time employment   Vocation Requirements Desk work   Leisure She walks the dog every day, 5 mins. to 2 miles     Cognition   Overall Cognitive Status Within Functional Limits for tasks assessed     Observation/Other Assessments   Skin Integrity  left lateral breast with well healed scar some fullness around scar.  Site is at the point that underwire on bra ends.  Hand mirror used for pt to see the redness it caused.  She said she ususally wears sports bras that do not have a wire    Quick DASH  27.27     Sensation   Additional Comments pt reports numbness and tingling in left hand and fingers      Coordination   Gross Motor Movements  are Fluid and Coordinated Yes     Posture/Postural Control   Posture/Postural Control Postural limitations   Postural Limitations Rounded Shoulders;Forward head   Posture Comments pt feels tighness and pulling in pec major area      AROM   Right Shoulder Horizontal ABduction --   Left Shoulder Extension --   Left Shoulder Flexion 146 Degrees   Left Shoulder ABduction 105 Degrees   Left Shoulder Internal Rotation 75 Degrees  in supine    Left Shoulder External Rotation 70 Degrees  in supine with pain in anterior chest    Left Shoulder Horizontal ABduction --     Strength   Overall Strength Due to pain   Overall Strength Comments scapulae move symmetrically    Right/Left Shoulder Right   Right Shoulder Flexion 4+/5   Right Shoulder ABduction 4+/5   Left Shoulder Flexion 3-/5  due to pain    Left Shoulder ABduction 3-/5  due to pain      Palpation   Palpation comment tighness and tenderness  in left anterior chest and axilla      Ambulation/Gait   Ambulation/Gait --   Ambulation/Gait Assistance --           LYMPHEDEMA/ONCOLOGY QUESTIONNAIRE - 03/02/17 0925      Right Upper Extremity Lymphedema   10 cm Proximal to Olecranon Process 30.5 cm   Olecranon Process 25 cm   10 cm Proximal to Ulnar Styloid Process 24.5 cm   Just Proximal to Ulnar Styloid Process 16.5 cm   Across Hand at [employer] 19 cm   At [place] of 2nd Digit 6.7 cm     Left Upper Extremity Lymphedema   10 cm Proximal to Olecranon Process 30.8 cm   Olecranon Process 24.5 cm   10 cm Proximal to Ulnar Styloid Process 24.5 cm   Just Proximal to Ulnar Styloid Process 16 cm   Across Hand at [employer] 18.6 cm   At [place] of 2nd Digit 6.4 cm           Quick Dash - 03/02/17 0001    Open a tight or new jar Mild difficulty   Do heavy household chores (wash walls, wash floors) Mild difficulty   Carry a shopping bag or briefcase Mild difficulty   Wash your back Moderate difficulty   Use a knife  to cut food No difficulty   Recreational activities in which you take some force or impact through your arm, shoulder, or hand (golf, hammering, tennis) Mild difficulty   During the past week, to what extent has your arm, shoulder or hand problem interfered with your normal  social activities with family, friends, neighbors, or groups? Slightly   During the past week, to what extent has your arm, shoulder or hand problem limited your work or other regular daily activities Slightly   Arm, shoulder, or hand pain. Mild   Tingling (pins and needles) in your arm, shoulder, or hand Moderate   Difficulty Sleeping Mild difficulty   DASH Score 27.27 %             [facility] Adult PT Treatment/Exercise - 03/02/17 0001      Self-Care   Self-Care Other Self-Care Comments   Other Self-Care Comments  issued handout from ABC class with lymphedema risk reduction and ROM exercises      Shoulder Exercises: Supine   External Rotation AROM   External Rotation Limitations with arm in 90 abduction with forearm supported on pillow at end range of comfort, pt did AROM for small range of internal rotation with  return to external rotation for end ranage stretch    Other Supine Exercises supine dowel rod flexion stretch    Other Supine Exercises meeds decompression exercises                 PT Education - 03/02/17 0947    Education provided Yes   Education Details shoulder stretches and ABC packet , flyer from [employer]) Educated Patient   Methods Explanation;Demonstration;Handout   Comprehension Need further instruction                [facility] Goals - 03/02/17 [phone]      CC Long Term Goal  #1   Title Patient will be knowledgeable about home exercise program for left shoulder ROM and strength    Time 4   Period Weeks   Status New     CC Long Term Goal  #2   Title Pt will acknoledge awareness of lymphedema risk reduction    Time 4   Period Weeks   Status New      CC Long Term Goal  #3   Title Pt will have increase in left shoulder abduction to 140 so that she is able to do self care and household activiites with greater ease    Baseline 105   Time 4   Period Weeks   Status New     CC Long Term Goal  #4   Title Pt will report her pain and discomfort have decreased by 50%   Time 4   Period Weeks   Status New     CC Long Term Goal  #5   Title Pt will verbalize a plan to continue her strenthening exercise on her own or in the community to decrease lymphedema and cancer recurrance risk    Time 4   Period Weeks   Status New            Plan - 03/02/17 4174    Clinical Impression Statement Pt returns to PT after radiation with decreased left shoulder range of motion and strength due to pain.  She also has occasional tingling in hand and fingers and soft tissue tightness in anterior chest and axilla. She is at risk for lymphedema. She is working full time so she may be limited in number of times she can attend PT. Because of these factors and evolving nature of her pain this is a moderately complex eval    Rehab Potential Excellent   Clinical Impairments Affecting Rehab Potential  2 axillary lymph nodes  removed which were positive, recent axillary radiation    PT Frequency --  4 visits over 4 weeks    PT Duration 4 weeks   PT Treatment/Interventions ADLs/Self Care Home Management;Patient/family education;Orthotic Fit/Training;DME Instruction;Taping;Manual techniques;Manual lymph drainage;Therapeutic activities;Therapeutic exercise;Compression bandaging;Scar mobilization;Passive range of motion   PT Next Visit Plan Pulley, wall stretches and doorward stretch and teach as HEP for stretching.  teach supine scapular series for strengthening. Review lymphedema risk reduction or encourage pt to attend ABC class. Tell pt about community yoga classes  after that teach Strength ABC program for continued home ex.  inform pt about how to get a compression  sleeve for airplane travel    Consulted and Agree with Plan of Care Patient      Patient will benefit from skilled therapeutic intervention in order to improve the following deficits and impairments:  Postural dysfunction, Decreased range of motion, Pain, Impaired UE functional use, Increased fascial restricitons, Decreased strength, Decreased knowledge of use of DME, Impaired sensation, Decreased knowledge of precautions, Impaired perceived functional ability  Visit Diagnosis: Stiffness of left shoulder, not elsewhere classified - Plan: PT plan of care cert/re-cert  Abnormal posture - Plan: PT plan of care cert/re-cert  Pain in joint of left shoulder region - Plan: PT plan of care cert/re-cert  Disorder of the skin and subcutaneous tissue related to radiation, unspecified - Plan: PT plan of care cert/re-cert     Problem List Patient Active Problem List   Diagnosis Date Noted  . Genetic testing 10/04/2016  . Breast cancer of upper-outer quadrant of left female breast ([place]) 09/10/2016   Donato Heinz. Owens Shark PT  Norwood Levo 03/02/2017, 10:03 AM  [place], [place], [address] Phone: [phone]   Fax:  [phone]  Name: AIRI COPADO MRN: [id number] Date of Birth: 24-Nov-1970

## 2017-03-08 ENCOUNTER — Ambulatory Visit: Payer: Managed Care, Other (non HMO)

## 2017-03-08 DIAGNOSIS — M25612 Stiffness of left shoulder, not elsewhere classified: Secondary | ICD-10-CM

## 2017-03-08 DIAGNOSIS — R293 Abnormal posture: Secondary | ICD-10-CM

## 2017-03-08 DIAGNOSIS — M25512 Pain in left shoulder: Secondary | ICD-10-CM

## 2017-03-08 DIAGNOSIS — Z483 Aftercare following surgery for neoplasm: Secondary | ICD-10-CM

## 2017-03-08 DIAGNOSIS — L599 Disorder of the skin and subcutaneous tissue related to radiation, unspecified: Secondary | ICD-10-CM

## 2017-03-08 NOTE — Therapy (Signed)
[place], [place], [address] Phone: [phone]   Fax:  [phone]  Physical Therapy Treatment  Patient Details  Name: Gloria Smith MRN: [id number] Date of Birth: 04-Aug-1971 Referring Provider: Worthy Flank, PA-C   Encounter Date: 03/08/2017      PT End of Session - 03/08/17 1146    Visit Number 2   Number of Visits 5   Date for PT Re-Evaluation 04/01/17   PT Start Time 1112   PT Stop Time 1153   PT Time Calculation (min) 41 min   Activity Tolerance Patient tolerated treatment well   Behavior During Therapy [facility] for tasks assessed/performed      Past Medical History:  Diagnosis Date  . Headache    ocular migraines - very rare  . Malignant neoplasm of upper-outer quadrant of right female breast ([place]) 09/10/2016   pt states that cancer is on left breast, not right  . Urinary frequency     Past Surgical History:  Procedure Laterality Date  . BREAST LUMPECTOMY WITH RADIOACTIVE SEED AND SENTINEL LYMPH NODE BIOPSY Left 10/05/2016   Procedure: LEFT BREAST RADIOACTIVE SEED GUIDED LUMPECTOMY, LEFT AXILLARY RADIOACTIVE SEED GUIDED NODE EXCISION, LEFT AXILLARY SENTINEL LYMPH NODE BIOPSY(LEFT TARGETED AXILLARY DISSECTION);  Surgeon: Rolm Bookbinder, MD;  Location: [facility];  Service: General;  Laterality: Left;  . DILATION AND CURETTAGE OF UTERUS    . right knee surgery at age of 81       There were no vitals filed for this visit.      Subjective Assessment - 03/08/17 1116    Subjective Ready to be able to move my arm again.   Pertinent History Patient was diagnosed on 09/01/16 with left invasive breast cancer.  It is ER/PR positive, HER2 negative, and has a Ki67 of 5%. It measures 1 cm and is located in the upper outer quadrant. She has a biopsied positive axillary lymph node.  She subsequently went on to surgery on 10/05/16 where she had a left breast lumpectomy with seed guided node  evaluation which revealed an grade 1 invasive and low grade in situ ductal carcinoma measuring 1 cm with negative margins. Of the two lymph nodes, both contained metastatic disease with extracapsular extension.  She finished radiation at the end of Feb   Patient Stated Goals to get better return of left shoulder motion    Currently in Pain? No/denies                         [facility] Adult PT Treatment/Exercise - 03/08/17 0001      Shoulder Exercises: Supine   Horizontal ABduction Strengthening;Both;10 reps;Theraband   Theraband Level (Shoulder Horizontal ABduction) Level 1 (Yellow)   External Rotation Strengthening;Both;10 reps;Theraband   Theraband Level (Shoulder External Rotation) Level 1 (Yellow)   Flexion Strengthening;Both;10 reps;Theraband  Narrow and Wide grip 10 times each   Theraband Level (Shoulder Flexion) Level 1 (Yellow)   Other Supine Exercises Bil D2 with yellow theraband 5 times each side, pt returned correct therapist demonstration.      Shoulder Exercises: Pulleys   Flexion 2 minutes   Flexion Limitations VC to decrease compensations of Lt scapula   ABduction 2 minutes   ABduction Limitations Tactile cuing to decrease Lt scapula compensations and VC to relax shoulders throughout     Shoulder Exercises: Therapy Ball   Flexion 10 reps  With forward lean into end of stretch     Shoulder  Exercises: IT sales professional 3 reps;10 seconds   Corner Stretch Limitations VC throughout to decrease scapular compensation.   Other Shoulder Stretches Finger Ladder for Lt UE abduction 5 times with tactile cuing to decrease scaoular compensation     Manual Therapy   Myofascial Release --   Passive ROM In Supine to Lt shoulder into flexion, abduction, D2, and er to pts tolerance, she had pain at end ROM mostly with abduction                PT Education - 03/08/17 1145    Education provided Yes   Education Details Supine scapular series with yelow  theraband   Person(s) Educated Patient   Methods Explanation;Demonstration;Handout   Comprehension Verbalized understanding;Returned demonstration;Need further instruction                [facility] Goals - 03/02/17 9937      CC Long Term Goal  #1   Title Patient will be knowledgeable about home exercise program for left shoulder ROM and strength    Time 4   Period Weeks   Status New     CC Long Term Goal  #2   Title Pt will acknoledge awareness of lymphedema risk reduction    Time 4   Period Weeks   Status New     CC Long Term Goal  #3   Title Pt will have increase in left shoulder abduction to 140 so that she is able to do self care and household activiites with greater ease    Baseline 105   Time 4   Period Weeks   Status New     CC Long Term Goal  #4   Title Pt will report her pain and discomfort have decreased by 50%   Time 4   Period Weeks   Status New     CC Long Term Goal  #5   Title Pt will verbalize a plan to continue her strenthening exercise on her own or in the community to decrease lymphedema and cancer recurrance risk    Time 4   Period Weeks   Status New            Plan - 03/08/17 1156    Clinical Impression Statement Pt tolerated stretching mostly well, but did have pain at her end ROM, mostly with abduction. She had trouble relxing with pulleys and getting her full ROM but did well with ball on wall stretching. Also had no pain in Lt shoulder with supine scapular series issued today but did report some pain/tightness with her Rt shoulder at some end motions (flexion).    Rehab Potential Excellent   Clinical Impairments Affecting Rehab Potential  2 axillary lymph nodes removed which were positive, recent axillary radiation    PT Frequency --  4 visits over 4 weeks   PT Duration 4 weeks   PT Treatment/Interventions ADLs/Self Care Home Management;Patient/family education;Orthotic Fit/Training;DME Instruction;Taping;Manual  techniques;Manual lymph drainage;Therapeutic activities;Therapeutic exercise;Compression bandaging;Scar mobilization;Passive range of motion   PT Next Visit Plan Remeasure ROM. Cont pulley, wall stretches and doorway stretch and teach as HEP for stretching.  Review supine scapular series for strengthening. Review lymphedema risk reduction or encourage pt to attend ABC class. Tell pt about community yoga classes  after that teach Strength ABC program for continued home ex.  inform pt about how to get a compression sleeve for airplane travel    Consulted and Agree with Plan of Care Patient  Patient will benefit from skilled therapeutic intervention in order to improve the following deficits and impairments:  Postural dysfunction, Decreased range of motion, Pain, Impaired UE functional use, Increased fascial restricitons, Decreased strength, Decreased knowledge of use of DME, Impaired sensation, Decreased knowledge of precautions, Impaired perceived functional ability  Visit Diagnosis: Stiffness of left shoulder, not elsewhere classified  Abnormal posture  Pain in joint of left shoulder region  Disorder of the skin and subcutaneous tissue related to radiation, unspecified  Aftercare following surgery for neoplasm     Problem List Patient Active Problem List   Diagnosis Date Noted  . Genetic testing 10/04/2016  . Breast cancer of upper-outer quadrant of left female breast ([place]) 09/10/2016    Otelia Limes, PTA 03/08/2017, 12:03 PM  [place], [place], [address] Phone: [phone]   Fax:  [phone]  Name: Gloria Smith MRN: [id number] Date of Birth: December 16, 1970

## 2017-03-08 NOTE — Patient Instructions (Signed)

## 2017-03-10 ENCOUNTER — Encounter: Payer: Self-pay | Admitting: Physical Therapy

## 2017-03-10 ENCOUNTER — Ambulatory Visit: Payer: Managed Care, Other (non HMO) | Admitting: Physical Therapy

## 2017-03-10 DIAGNOSIS — M25612 Stiffness of left shoulder, not elsewhere classified: Secondary | ICD-10-CM

## 2017-03-10 DIAGNOSIS — M25512 Pain in left shoulder: Secondary | ICD-10-CM

## 2017-03-10 DIAGNOSIS — R293 Abnormal posture: Secondary | ICD-10-CM

## 2017-03-10 NOTE — Therapy (Signed)
[place], [place], [address] Phone: [phone]   Fax:  [phone]  Physical Therapy Treatment  Patient Details  Name: Gloria Smith MRN: [id number] Date of Birth: 1971-04-15 Referring Provider: Worthy Flank, PA-C   Encounter Date: 03/10/2017      PT End of Session - 03/10/17 1150    Visit Number 3   Number of Visits 5   Date for PT Re-Evaluation 04/01/17   PT Start Time 1105   PT Stop Time 1146   PT Time Calculation (min) 41 min   Activity Tolerance Patient tolerated treatment well   Behavior During Therapy [facility] for tasks assessed/performed      Past Medical History:  Diagnosis Date  . Headache    ocular migraines - very rare  . Malignant neoplasm of upper-outer quadrant of right female breast ([place]) 09/10/2016   pt states that cancer is on left breast, not right  . Urinary frequency     Past Surgical History:  Procedure Laterality Date  . BREAST LUMPECTOMY WITH RADIOACTIVE SEED AND SENTINEL LYMPH NODE BIOPSY Left 10/05/2016   Procedure: LEFT BREAST RADIOACTIVE SEED GUIDED LUMPECTOMY, LEFT AXILLARY RADIOACTIVE SEED GUIDED NODE EXCISION, LEFT AXILLARY SENTINEL LYMPH NODE BIOPSY(LEFT TARGETED AXILLARY DISSECTION);  Surgeon: Rolm Bookbinder, MD;  Location: [facility];  Service: General;  Laterality: Left;  . DILATION AND CURETTAGE OF UTERUS    . right knee surgery at age of 40       There were no vitals filed for this visit.      Subjective Assessment - 03/10/17 1108    Subjective My shoulder is not too bad. No major change in either direction.    Pertinent History Patient was diagnosed on 09/01/16 with left invasive breast cancer.  It is ER/PR positive, HER2 negative, and has a Ki67 of 5%. It measures 1 cm and is located in the upper outer quadrant. She has a biopsied positive axillary lymph node.  She subsequently went on to surgery on 10/05/16 where she had a left breast lumpectomy with  seed guided node evaluation which revealed an grade 1 invasive and low grade in situ ductal carcinoma measuring 1 cm with negative margins. Of the two lymph nodes, both contained metastatic disease with extracapsular extension.  She finished radiation at the end of Feb   Patient Stated Goals to get better return of left shoulder motion    Currently in Pain? No/denies   Pain Score 0-No pain            [facility] PT Assessment - 03/10/17 0001      AROM   Left Shoulder Flexion 150 Degrees   Left Shoulder ABduction 111 Degrees                     [facility] Adult PT Treatment/Exercise - 03/10/17 0001      Shoulder Exercises: Supine   Horizontal ABduction Strengthening;Both;10 reps;Theraband   Theraband Level (Shoulder Horizontal ABduction) Level 1 (Yellow)   External Rotation Strengthening;Both;10 reps;Theraband   Theraband Level (Shoulder External Rotation) Level 1 (Yellow)   Flexion Strengthening;Both;10 reps;Theraband  Narrow and Wide grip 10 times each   Theraband Level (Shoulder Flexion) Level 1 (Yellow)   Other Supine Exercises Bil D2 with yellow theraband 5 times each side, pt returned correct therapist demonstration.      Shoulder Exercises: Pulleys   Flexion 2 minutes   Flexion Limitations VC to decrease compensations of Lt scapula   ABduction 2 minutes  ABduction Limitations Tactile cuing to decrease Lt scapula compensations and VC to relax shoulders throughout     Shoulder Exercises: Therapy Ball   Flexion 10 reps  With forward lean into end of stretch     Shoulder Exercises: Stretch   Corner Stretch 3 reps;10 seconds     Manual Therapy   Passive ROM In Supine to Lt shoulder into flexion, abduction, D2, and er to pts tolerance, she had pain at end ROM mostly with abduction                PT Education - 03/10/17 1154    Education provided Yes   Education Details lymphedema risk reduction practices, ABC class   Person(s) Educated Patient   Methods  Explanation;Handout   Comprehension Verbalized understanding                [facility] Goals - 03/10/17 1115      CC Long Term Goal  #1   Title Patient will be knowledgeable about home exercise program for left shoulder ROM and strength    Time 4   Period Weeks   Status On-going     CC Long Term Goal  #2   Title Pt will acknoledge awareness of lymphedema risk reduction    Baseline she plans to attend on 11/08/16   Time 4   Period Weeks   Status On-going     CC Long Term Goal  #3   Title Pt will have increase in left shoulder abduction to 140 so that she is able to do self care and household activiites with greater ease    Baseline 105, 4/19- 111   Time 4   Period Weeks   Status On-going     CC Long Term Goal  #4   Title Pt will report her pain and discomfort have decreased by 50%   Baseline 03/10/17- pt reports her ROM is improving but her discomfort level hasn't changed   Time 4   Period Weeks   Status On-going     CC Long Term Goal  #5   Title Pt will verbalize a plan to continue her strenthening exercise on her own or in the community to decrease lymphedema and cancer recurrance risk    Time 4   Period Weeks   Status On-going            Plan - 03/10/17 1151    Clinical Impression Statement Pt demonstrates improvement in L UE ROM today. She is still having pain during AROM but was able to achieve full PROM in supine with no pain. Pt demonstrated improved independence in supine scapular series today. Educated pt about lymphedema risk reduction practices and assisted pt with signing up for ABC class.    Rehab Potential Excellent   Clinical Impairments Affecting Rehab Potential  2 axillary lymph nodes removed which were positive, recent axillary radiation    PT Frequency --  4 visits over 4 weeks   PT Treatment/Interventions ADLs/Self Care Home Management;Patient/family education;Orthotic Fit/Training;DME Instruction;Taping;Manual techniques;Manual  lymph drainage;Therapeutic activities;Therapeutic exercise;Compression bandaging;Scar mobilization;Passive range of motion   PT Next Visit Plan Cont pulley, wall stretches and doorway stretch and teach as HEP for stretching. Tell pt about community yoga classes  after that teach Strength ABC program for continued home ex, get signed RX for compression sleeve   Consulted and Agree with Plan of Care Patient      Patient will benefit from skilled therapeutic intervention in order to improve the  following deficits and impairments:  Postural dysfunction, Decreased range of motion, Pain, Impaired UE functional use, Increased fascial restricitons, Decreased strength, Decreased knowledge of use of DME, Impaired sensation, Decreased knowledge of precautions, Impaired perceived functional ability  Visit Diagnosis: Stiffness of left shoulder, not elsewhere classified  Abnormal posture  Pain in joint of left shoulder region     Problem List Patient Active Problem List   Diagnosis Date Noted  . Genetic testing 10/04/2016  . Breast cancer of upper-outer quadrant of left female breast ([place]) 09/10/2016    Allyson Sabal [facility] 03/10/2017, 11:55 AM  [place], [place], [address] Phone: [phone]   Fax:  [phone]  Name: ASIANNA BRUNDAGE MRN: [id number] Date of Birth: 09-11-1971  Manus Gunning, PT 03/10/17 11:55 AM

## 2017-03-15 ENCOUNTER — Ambulatory Visit: Payer: Managed Care, Other (non HMO) | Admitting: Physical Therapy

## 2017-03-15 DIAGNOSIS — M25512 Pain in left shoulder: Secondary | ICD-10-CM

## 2017-03-15 DIAGNOSIS — R293 Abnormal posture: Secondary | ICD-10-CM

## 2017-03-15 DIAGNOSIS — Z483 Aftercare following surgery for neoplasm: Secondary | ICD-10-CM

## 2017-03-15 DIAGNOSIS — M25612 Stiffness of left shoulder, not elsewhere classified: Secondary | ICD-10-CM | POA: Diagnosis not present

## 2017-03-15 DIAGNOSIS — L599 Disorder of the skin and subcutaneous tissue related to radiation, unspecified: Secondary | ICD-10-CM

## 2017-03-15 NOTE — Patient Instructions (Signed)
First of all, check with your insurance company to see if provider is in [facility]                                            [address]  [place], [place] [address] [phone]    Does not file for insurance--- call for appointment with [facility]   (for wigs and compression sleeves / gloves/gauntlets )  [place], [place] [address] [phone]  Will file some insurances --- call for appointment   Second to [facility] (for mastectomy prosthetics and garments) [place], [place] [address] [phone] Will file some insurances --- call for appointment  [facility]  [address]  [place], [place] [address] [phone] Lower extremity garments  [facility] [address] [place],   [address] [place] Sales rep:  [employer]:  [phone] [email or web address] Biocompression pumps   [employer]  Sales rep: Donneta Romberg:  [phone] [email or web address] [place] Pulley and Flexitouch pumps    Other Resources: National Lymphedema Network:  www.lymphnet.org www.Klosetraining.com for patient articles and purchase a self manual lymph drainage DVD www.[email or web address] has informative articles.     [email or web address].[email or web address] Courses Online Strength After Breast Cancer Look at the right of the page for Lymphedema Education Session

## 2017-03-15 NOTE — Therapy (Signed)
[place], [place], [address] Phone: [phone]   Fax:  [phone]  Physical Therapy Treatment  Patient Details  Name: Gloria Smith MRN: [id number] Date of Birth: 10/11/1971 Referring Provider: Worthy Flank, PA-C   Encounter Date: 03/15/2017      PT End of Session - 03/15/17 1723    Visit Number 4   Number of Visits 5   Date for PT Re-Evaluation 04/01/17   PT Start Time 1600   PT Stop Time 1650   PT Time Calculation (min) 50 min   Activity Tolerance Patient tolerated treatment well   Behavior During Therapy [facility] for tasks assessed/performed      Past Medical History:  Diagnosis Date  . Headache    ocular migraines - very rare  . Malignant neoplasm of upper-outer quadrant of right female breast ([place]) 09/10/2016   pt states that cancer is on left breast, not right  . Urinary frequency     Past Surgical History:  Procedure Laterality Date  . BREAST LUMPECTOMY WITH RADIOACTIVE SEED AND SENTINEL LYMPH NODE BIOPSY Left 10/05/2016   Procedure: LEFT BREAST RADIOACTIVE SEED GUIDED LUMPECTOMY, LEFT AXILLARY RADIOACTIVE SEED GUIDED NODE EXCISION, LEFT AXILLARY SENTINEL LYMPH NODE BIOPSY(LEFT TARGETED AXILLARY DISSECTION);  Surgeon: Rolm Bookbinder, MD;  Location: [facility];  Service: General;  Laterality: Left;  . DILATION AND CURETTAGE OF UTERUS    . right knee surgery at age of 3       There were no vitals filed for this visit.      Subjective Assessment - 03/15/17 1608    Subjective "I can tell its getting better."  Pt is happy that her shoulder is improving, but she still has pain in anterior chest and decreased shoulder abduction.  She also reports problems with both hands going numb and inability to sleep on her left side because her hand goes numb.    Pertinent History Patient was diagnosed on 09/01/16 with left invasive breast cancer.  It is ER/PR positive, HER2 negative, and has a Ki67  of 5%. It measures 1 cm and is located in the upper outer quadrant. She has a biopsied positive axillary lymph node.  She subsequently went on to surgery on 10/05/16 where she had a left breast lumpectomy with seed guided node evaluation which revealed an grade 1 invasive and low grade in situ ductal carcinoma measuring 1 cm with negative margins. Of the two lymph nodes, both contained metastatic disease with extracapsular extension.  She finished radiation at the end of Feb   Patient Stated Goals to get better return of left shoulder motion    Currently in Pain? No/denies            [facility] PT Assessment - 03/15/17 0001      AROM   Left Shoulder Flexion 160 Degrees   Left Shoulder ABduction 115 Degrees                     [facility] Adult PT Treatment/Exercise - 03/15/17 0001      Self-Care   Self-Care Other Self-Care Comments   Other Self-Care Comments  disussed where to go get a compression sleeve and prescription sent to [facility] to be signed.      Exercises   Exercises Other Exercises   Other Exercises  Instructed in and performed exercises in Strenth ABC program up to and including chest press      Shoulder Exercises: Pulleys   Flexion 2  minutes   ABduction 2 minutes                PT Education - 03/15/17 1731    Education provided Yes   Education Details flyers for community yoga classes and [employer] , where to get compression garments.    Person(s) Educated Patient   Methods Explanation;Handout   Comprehension Verbalized understanding                [place] - 03/10/17 1115      CC Long Term Goal  #1   Title Patient will be knowledgeable about home exercise program for left shoulder ROM and strength    Time 4   Period Weeks   Status On-going     CC Long Term Goal  #2   Title Pt will acknoledge awareness of lymphedema risk reduction    Baseline she plans to attend on 11/08/16   Time 4   Period Weeks   Status On-going      CC Long Term Goal  #3   Title Pt will have increase in left shoulder abduction to 140 so that she is able to do self care and household activiites with greater ease    Baseline 105, 4/19- 111   Time 4   Period Weeks   Status On-going     CC Long Term Goal  #4   Title Pt will report her pain and discomfort have decreased by 50%   Baseline 03/10/17- pt reports her ROM is improving but her discomfort level hasn't changed   Time 4   Period Weeks   Status On-going     CC Long Term Goal  #5   Title Pt will verbalize a plan to continue her strenthening exercise on her own or in the community to decrease lymphedema and cancer recurrance risk    Time 4   Period Weeks   Status On-going            Plan - 03/15/17 1723    Clinical Impression Statement Pt is improving in shoulder range of motion, but is still limited by pain in shoulder abduction and has tenderness to palpation at anterior chest.  She did well with intital instruction in Strength ABC program, but needs to have it completed.  Attempted positioning in sidelying with pilllows under body and head, but pt still had numbness in whole hand in less than  a minute due to ??   Given flyers for community Yoga programs and [facility]    Rehab Potential Excellent   Clinical Impairments Affecting Rehab Potential  2 axillary lymph nodes removed which were positive, recent axillary radiation    PT Frequency 2x / week   PT Duration 4 weeks   PT Treatment/Interventions ADLs/Self Care Home Management;Patient/family education;Orthotic Fit/Training;DME Instruction;Taping;Manual techniques;Manual lymph drainage;Therapeutic activities;Therapeutic exercise;Compression bandaging;Scar mobilization;Passive range of motion   PT Next Visit Plan Cont pulley, wall stretches and doorway stretch and teach as HEP for stretching. Tell pt about community yoga classes  after that teach Strength ABC program for continued home ex, get signed RX for compression sleeve       Patient will benefit from skilled therapeutic intervention in order to improve the following deficits and impairments:  Postural dysfunction, Decreased range of motion, Pain, Impaired UE functional use, Increased fascial restricitons, Decreased strength, Decreased knowledge of use of DME, Impaired sensation, Decreased knowledge of precautions, Impaired perceived functional ability  Visit Diagnosis: Stiffness of left shoulder, not elsewhere classified  Abnormal  posture  Pain in joint of left shoulder region  Disorder of the skin and subcutaneous tissue related to radiation, unspecified  Aftercare following surgery for neoplasm     Problem List Patient Active Problem List   Diagnosis Date Noted  . Genetic testing 10/04/2016  . Breast cancer of upper-outer quadrant of left female breast ([place]) 09/10/2016   Donato Heinz. Owens Shark PT  Norwood Levo 03/15/2017, 5:33 PM  [place], [place], [address] Phone: [phone]   Fax:  [phone]  Name: Gloria Smith MRN: [id number] Date of Birth: 1971/10/12

## 2017-03-17 ENCOUNTER — Other Ambulatory Visit: Payer: Self-pay | Admitting: Hematology

## 2017-03-17 ENCOUNTER — Ambulatory Visit: Payer: Managed Care, Other (non HMO)

## 2017-03-17 ENCOUNTER — Ambulatory Visit: Payer: Managed Care, Other (non HMO) | Admitting: Physical Therapy

## 2017-03-17 DIAGNOSIS — M25612 Stiffness of left shoulder, not elsewhere classified: Secondary | ICD-10-CM

## 2017-03-17 DIAGNOSIS — Z483 Aftercare following surgery for neoplasm: Secondary | ICD-10-CM

## 2017-03-17 DIAGNOSIS — C50412 Malignant neoplasm of upper-outer quadrant of left female breast: Secondary | ICD-10-CM

## 2017-03-17 DIAGNOSIS — L599 Disorder of the skin and subcutaneous tissue related to radiation, unspecified: Secondary | ICD-10-CM

## 2017-03-17 DIAGNOSIS — R293 Abnormal posture: Secondary | ICD-10-CM

## 2017-03-17 DIAGNOSIS — Z170 Estrogen receptor positive status [ER+]: Principal | ICD-10-CM

## 2017-03-17 MED ORDER — GOSERELIN ACETATE 10.8 MG ~~LOC~~ IMPL
10.8000 mg | DRUG_IMPLANT | Freq: Once | SUBCUTANEOUS | Status: DC
  Filled 2017-03-17: qty 10.8

## 2017-03-17 NOTE — Progress Notes (Signed)
Pt came in for injection today. Dose of Zoladex 3.6 is not in stock. Pt resheduled for injection to be done tomorrow.

## 2017-03-17 NOTE — Patient Instructions (Signed)
Goserelin injection What is this medicine? GOSERELIN (GOE se rel in) is similar to a hormone found in the body. It lowers the amount of sex hormones that the body makes. Men will have lower testosterone levels and women will have lower estrogen levels while taking this medicine. In men, this medicine is used to treat prostate cancer; the injection is either given once per month or once every 12 weeks. A once per month injection (only) is used to treat women with endometriosis, dysfunctional uterine bleeding, or advanced breast cancer. This medicine may be used for other purposes; ask your health care provider or pharmacist if you have questions. COMMON BRAND NAME(S): Zoladex What should I tell my health care provider before I take this medicine? They need to know if you have any of these conditions (some only apply to women): -diabetes -heart disease or previous heart attack -high blood pressure -high cholesterol -kidney disease -osteoporosis or low bone density -problems passing urine -spinal cord injury -stroke -tobacco smoker -an unusual or allergic reaction to goserelin, hormone therapy, other medicines, foods, dyes, or preservatives -pregnant or trying to get pregnant -breast-feeding How should I use this medicine? This medicine is for injection under the skin. It is given by a health care professional in a hospital or clinic setting. Men receive this injection once every 4 weeks or once every 12 weeks. Women will only receive the once every 4 weeks injection. Talk to your pediatrician regarding the use of this medicine in children. Special care may be needed. Overdosage: If you think you have taken too much of this medicine contact a poison control center or emergency room at once. NOTE: This medicine is only for you. Do not share this medicine with others. What if I miss a dose? It is important not to miss your dose. Call your doctor or health care professional if you are unable to  keep an appointment. What may interact with this medicine? -female hormones like estrogen -herbal or dietary supplements like black cohosh, chasteberry, or DHEA -female hormones like testosterone -prasterone This list may not describe all possible interactions. Give your health care provider a list of all the medicines, herbs, non-prescription drugs, or dietary supplements you use. Also tell them if you smoke, drink alcohol, or use illegal drugs. Some items may interact with your medicine. What should I watch for while using this medicine? Visit your doctor or health care professional for regular checks on your progress. Your symptoms may appear to get worse during the first weeks of this therapy. Tell your doctor or healthcare professional if your symptoms do not start to get better or if they get worse after this time. Your bones may get weaker if you take this medicine for a long time. If you smoke or frequently drink alcohol you may increase your risk of bone loss. A family history of osteoporosis, chronic use of drugs for seizures (convulsions), or corticosteroids can also increase your risk of bone loss. Talk to your doctor about how to keep your bones strong. This medicine should stop regular monthly menstration in women. Tell your doctor if you continue to menstrate. Women should not become pregnant while taking this medicine or for 12 weeks after stopping this medicine. Women should inform their doctor if they wish to become pregnant or think they might be pregnant. There is a potential for serious side effects to an unborn child. Talk to your health care professional or pharmacist for more information. Do not breast-feed an infant while taking   this medicine. Men should inform their doctors if they wish to father a child. This medicine may lower sperm counts. Talk to your health care professional or pharmacist for more information. What side effects may I notice from receiving this  medicine? Side effects that you should report to your doctor or health care professional as soon as possible: -allergic reactions like skin rash, itching or hives, swelling of the face, lips, or tongue -bone pain -breathing problems -changes in vision -chest pain -feeling faint or lightheaded, falls -fever, chills -pain, swelling, warmth in the leg -pain, tingling, numbness in the hands or feet -signs and symptoms of low blood pressure like dizziness; feeling faint or lightheaded, falls; unusually weak or tired -stomach pain -swelling of the ankles, feet, hands -trouble passing urine or change in the amount of urine -unusually high or low blood pressure -unusually weak or tired Side effects that usually do not require medical attention (report to your doctor or health care professional if they continue or are bothersome): -change in sex drive or performance -changes in breast size in both males and females -changes in emotions or moods -headache -hot flashes -irritation at site where injected -loss of appetite -skin problems like acne, dry skin -vaginal dryness This list may not describe all possible side effects. Call your doctor for medical advice about side effects. You may report side effects to FDA at 1-800-FDA-1088. Where should I keep my medicine? This drug is given in a hospital or clinic and will not be stored at home. NOTE: This sheet is a summary. It may not cover all possible information. If you have questions about this medicine, talk to your doctor, pharmacist, or health care provider.  2017 Elsevier/Gold Standard (2014-01-15 11:10:35)

## 2017-03-17 NOTE — Therapy (Signed)
[place], [place], [address] Phone: [phone]   Fax:  [phone]  Physical Therapy Treatment  Patient Details  Name: Gloria Smith MRN: [id number] Date of Birth: 31-Jan-1971 Referring Provider: Worthy Flank   Encounter Date: 03/17/2017      PT End of Session - 03/17/17 1722    Visit Number 5   Number of Visits 11   Date for PT Re-Evaluation 04/07/17   PT Start Time 1696   PT Stop Time 1600   PT Time Calculation (min) 45 min   Activity Tolerance Patient tolerated treatment well   Behavior During Therapy [facility] for tasks assessed/performed      Past Medical History:  Diagnosis Date  . Headache    ocular migraines - very rare  . Malignant neoplasm of upper-outer quadrant of right female breast ([place]) 09/10/2016   pt states that cancer is on left breast, not right  . Urinary frequency     Past Surgical History:  Procedure Laterality Date  . BREAST LUMPECTOMY WITH RADIOACTIVE SEED AND SENTINEL LYMPH NODE BIOPSY Left 10/05/2016   Procedure: LEFT BREAST RADIOACTIVE SEED GUIDED LUMPECTOMY, LEFT AXILLARY RADIOACTIVE SEED GUIDED NODE EXCISION, LEFT AXILLARY SENTINEL LYMPH NODE BIOPSY(LEFT TARGETED AXILLARY DISSECTION);  Surgeon: Rolm Bookbinder, MD;  Location: [place];  Service: General;  Laterality: Left;  . DILATION AND CURETTAGE OF UTERUS    . right knee surgery at age of 47       There were no vitals filed for this visit.      Subjective Assessment - 03/17/17 1521    Subjective "I am not doing good today"  "All across my shoulders I just ache. It just feels tight. "  I can feel it just sitting sitll    Pertinent History Patient was diagnosed on 09/01/16 with left invasive breast cancer.  It is ER/PR positive, HER2 negative, and has a Ki67 of 5%. It measures 1 cm and is located in the upper outer quadrant. She has a biopsied positive axillary lymph node.  She subsequently went on to surgery  on 10/05/16 where she had a left breast lumpectomy with seed guided node evaluation which revealed an grade 1 invasive and low grade in situ ductal carcinoma measuring 1 cm with negative margins. Of the two lymph nodes, both contained metastatic disease with extracapsular extension.  She finished radiation at the end of Feb   Patient Stated Goals to get better return of left shoulder motion    Currently in Pain? Yes   Pain Score 5    Pain Location Neck   Pain Orientation Posterior;Upper;Right;Left   Pain Descriptors / Indicators Tightness;Aching   Pain Type Acute pain   Pain Onset Today  when she woke up    Aggravating Factors  moving   Pain Relieving Factors nothing makes it better, hurts just sitting still             [facility] PT Assessment - 03/17/17 0001      Assessment   Medical Diagnosis left breast cancer    Referring Provider Worthy Flank    Onset Date/Surgical Date 09/25/16     Prior Function   Level of Independence Independent     ROM / Strength   AROM / PROM / Strength AROM     AROM   Cervical Flexion limted to 25% wtih pain    Cervical Extension limited to 50%    Cervical - Right Rotation limited to 50 %  Cervical - Left Rotation limited to 50%      Palpation   Palpation comment very tender and tight in cervical muscles.                      [facility] Adult PT Treatment/Exercise - 03/17/17 0001      Moist Heat Therapy   Number Minutes Moist Heat 10 Minutes   Moist Heat Location Cervical     Manual Therapy   Manual Therapy Joint mobilization;Soft tissue mobilization;Myofascial release;Manual Traction   Manual therapy comments pt c/o numbness in left hand with inferior scapular mobillizaion    Joint Mobilization to left gleno-humeral joint for distraction and posterior and infereior glides    Soft tissue mobilization  with biotone to  posterior cervical muscles, left upper,, middle and lower trap and pec major    Myofascial Release to tightness  in pec major muscle    Passive ROM In Supine to Lt shoulder into flexion, abduction, D2, and er to pts tolerance, she had pain at end ROM mostly with abduction   Manual Traction to cervical area                         [place] - 03/17/17 1732      CC Long Term Goal  #1   Title Patient will be knowledgeable about home exercise program for left shoulder ROM and strength    Time 4   Period Weeks   Status On-going     CC Long Term Goal  #2   Title Pt will acknoledge awareness of lymphedema risk reduction    Time 4   Period Weeks   Status On-going     CC Long Term Goal  #3   Title Pt will have increase in left shoulder abduction to 140 so that she is able to do self care and household activiites with greater ease    Baseline 105, 4/19- 115 on 4/24    Time 4   Period Weeks   Status On-going     CC Long Term Goal  #4   Title Pt will report her pain and discomfort have decreased by 50%   Baseline 03/10/17- pt reports her ROM is improving but her discomfort level hasn't changed   Time 4   Period Weeks   Status On-going     CC Long Term Goal  #5   Title Pt will verbalize a plan to continue her strenthening exercise on her own or in the community to decrease lymphedema and cancer recurrance risk    Time 4   Period Weeks   Status On-going            Plan - 03/17/17 1725    Clinical Impression Statement Pt with c/o neck and anterior shoulder pain and tightness today, so did not complete education in Strength ABC program and focused on soft tissue work.  She said she felt much better after treatment than when she came in. Issued pt completed prescription for her to get her compression sleeve.  Pt has not progressed as quickly as intially anticipated and she continues to have pain with decreased shoulder abduction range of motion. Additionally, she has numbness in her left hand ( acutally both hands) whe she lies on that side and also had it with inferior  gliding of left scapula today due to ??  Would like to continue to work on stretching and posture correction as  well as teaching strength program for her to follow at home so have asked for extension for 6 more visits.     Rehab Potential Excellent   Clinical Impairments Affecting Rehab Potential  2 axillary lymph nodes removed which were positive, recent axillary radiation    PT Frequency 2x / week   PT Duration 4 weeks   PT Treatment/Interventions ADLs/Self Care Home Management;Patient/family education;Orthotic Fit/Training;DME Instruction;Taping;Manual techniques;Manual lymph drainage;Therapeutic activities;Therapeutic exercise;Compression bandaging;Scar mobilization;Passive range of motion   PT Next Visit Plan continue with soft tissue work, ROM posture exercises and strength ABC    Consulted and Agree with Plan of Care Patient      Patient will benefit from skilled therapeutic intervention in order to improve the following deficits and impairments:  Postural dysfunction, Decreased range of motion, Pain, Impaired UE functional use, Increased fascial restricitons, Decreased strength, Decreased knowledge of use of DME, Impaired sensation, Decreased knowledge of precautions, Impaired perceived functional ability  Visit Diagnosis: Stiffness of left shoulder, not elsewhere classified - Plan: PT plan of care cert/re-cert  Abnormal posture - Plan: PT plan of care cert/re-cert  Disorder of the skin and subcutaneous tissue related to radiation, unspecified - Plan: PT plan of care cert/re-cert  Aftercare following surgery for neoplasm - Plan: PT plan of care cert/re-cert     Problem List Patient Active Problem List   Diagnosis Date Noted  . Genetic testing 10/04/2016  . Breast cancer of upper-outer quadrant of left female breast ([place]) 09/10/2016   Donato Heinz. Owens Shark PT  Norwood Levo 03/17/2017, 5:38 PM  [place], [place], [address] Phone: [phone]   Fax:  [phone]  Name: Gloria Smith MRN: [id number] Date of Birth: [birth year]

## 2017-03-18 ENCOUNTER — Ambulatory Visit (HOSPITAL_BASED_OUTPATIENT_CLINIC_OR_DEPARTMENT_OTHER): Payer: Managed Care, Other (non HMO)

## 2017-03-18 ENCOUNTER — Other Ambulatory Visit: Payer: Self-pay | Admitting: Obstetrics

## 2017-03-18 VITALS — BP 139/67 | HR 72 | Temp 97.9°F | Resp 76

## 2017-03-18 DIAGNOSIS — C773 Secondary and unspecified malignant neoplasm of axilla and upper limb lymph nodes: Secondary | ICD-10-CM | POA: Diagnosis not present

## 2017-03-18 DIAGNOSIS — C50412 Malignant neoplasm of upper-outer quadrant of left female breast: Secondary | ICD-10-CM | POA: Diagnosis not present

## 2017-03-18 DIAGNOSIS — Z5111 Encounter for antineoplastic chemotherapy: Secondary | ICD-10-CM

## 2017-03-18 DIAGNOSIS — Z170 Estrogen receptor positive status [ER+]: Principal | ICD-10-CM

## 2017-03-18 MED ORDER — GOSERELIN ACETATE 3.6 MG ~~LOC~~ IMPL
3.6000 mg | DRUG_IMPLANT | Freq: Once | SUBCUTANEOUS | Status: AC
  Administered 2017-03-18: 3.6 mg via SUBCUTANEOUS
  Filled 2017-03-18: qty 3.6

## 2017-03-22 ENCOUNTER — Ambulatory Visit: Payer: Managed Care, Other (non HMO) | Attending: Radiation Oncology | Admitting: Physical Therapy

## 2017-03-22 DIAGNOSIS — L599 Disorder of the skin and subcutaneous tissue related to radiation, unspecified: Secondary | ICD-10-CM

## 2017-03-22 DIAGNOSIS — Z483 Aftercare following surgery for neoplasm: Secondary | ICD-10-CM | POA: Diagnosis present

## 2017-03-22 DIAGNOSIS — C50412 Malignant neoplasm of upper-outer quadrant of left female breast: Secondary | ICD-10-CM | POA: Diagnosis present

## 2017-03-22 DIAGNOSIS — M25512 Pain in left shoulder: Secondary | ICD-10-CM | POA: Insufficient documentation

## 2017-03-22 DIAGNOSIS — Z170 Estrogen receptor positive status [ER+]: Secondary | ICD-10-CM | POA: Insufficient documentation

## 2017-03-22 DIAGNOSIS — M25612 Stiffness of left shoulder, not elsewhere classified: Secondary | ICD-10-CM | POA: Insufficient documentation

## 2017-03-22 DIAGNOSIS — R293 Abnormal posture: Secondary | ICD-10-CM | POA: Insufficient documentation

## 2017-03-22 NOTE — Therapy (Signed)
[place], [place], [address] Phone: [phone]   Fax:  [phone]  Physical Therapy Treatment  Patient Details  Name: Gloria Smith MRN: [id number] Date of Birth: 21-Mar-1971 Referring Provider: Worthy Flank   Encounter Date: 03/22/2017      PT End of Session - 03/22/17 1247    Visit Number 6   Number of Visits 11   Date for PT Re-Evaluation 04/07/17   PT Start Time 1105   PT Stop Time 1145   PT Time Calculation (min) 40 min   Activity Tolerance Patient tolerated treatment well   Behavior During Therapy [facility] for tasks assessed/performed      Past Medical History:  Diagnosis Date  . Headache    ocular migraines - very rare  . Malignant neoplasm of upper-outer quadrant of right female breast ([place]) 09/10/2016   pt states that cancer is on left breast, not right  . Urinary frequency     Past Surgical History:  Procedure Laterality Date  . BREAST LUMPECTOMY WITH RADIOACTIVE SEED AND SENTINEL LYMPH NODE BIOPSY Left 10/05/2016   Procedure: LEFT BREAST RADIOACTIVE SEED GUIDED LUMPECTOMY, LEFT AXILLARY RADIOACTIVE SEED GUIDED NODE EXCISION, LEFT AXILLARY SENTINEL LYMPH NODE BIOPSY(LEFT TARGETED AXILLARY DISSECTION);  Surgeon: Rolm Bookbinder, MD;  Location: [place];  Service: General;  Laterality: Left;  . DILATION AND CURETTAGE OF UTERUS    . right knee surgery at age of 37       There were no vitals filed for this visit.      Subjective Assessment - 03/22/17 1111    Subjective Pt states she is feeling better but sitll has a little bit of pain in the right side of the neck  She feels that her left shoulder is improved  She joined [employer] so wants to finish learning the Strength ABC program so she could follow up there    Pertinent History Patient was diagnosed on 09/01/16 with left invasive breast cancer.  It is ER/PR positive, HER2 negative, and has a Ki67 of 5%. It measures 1 cm and  is located in the upper outer quadrant. She has a biopsied positive axillary lymph node.  She subsequently went on to surgery on 10/05/16 where she had a left breast lumpectomy with seed guided node evaluation which revealed an grade 1 invasive and low grade in situ ductal carcinoma measuring 1 cm with negative margins. Of the two lymph nodes, both contained metastatic disease with extracapsular extension.  She finished radiation at the end of Feb   Patient Stated Goals to get better return of left shoulder motion    Currently in Pain? No/denies  she has tightness in left anterior chest                          [facility] Adult PT Treatment/Exercise - 03/22/17 0001      Elbow Exercises   Elbow Flexion Strengthening;Both;20 reps;Standing;Bar weights/barbell   Bar Weights/Barbell (Elbow Flexion) 1 lb   Elbow Extension Strengthening;Both;20 reps  cues to keep upper arm level.    Bar Weights/Barbell (Elbow Extension) 1 lb     Knee/Hip Exercises: Standing   Heel Raises Both;2 sets;10 reps   Hip Abduction Stengthening;Right;Left;10 reps  holding 1#    Forward Step Up Both;2 sets;10 reps;Step Height: 8"   Functional Squat 2 sets;10 reps  holding 1#      Shoulder Exercises: Supine   Other Supine Exercises chest press  with one pound in each hand 2 sets of 10      Shoulder Exercises: Standing   Row Strengthening;Both;20 reps;Weights   Row Weight (lbs) 1   Other Standing Exercises scaption with 1 pound weight 2 sets of 10 with cues to keep core engaged . Pt chooses nice slow controlled speed      Shoulder Exercises: Stretch   Other Shoulder Stretches thoracic spine rotation  stretch in rigtht sidelying and quatduped with                         [facility] Goals - 03/22/17 1253      CC Long Term Goal  #1   Title Patient will be knowledgeable about home exercise program for left shoulder ROM and strength    Status Achieved     CC Long Term Goal  #2    Title Pt will acknoledge awareness of lymphedema risk reduction    Status Achieved     CC Long Term Goal  #3   Title Pt will have increase in left shoulder abduction to 140 so that she is able to do self care and household activiites with greater ease    Baseline 105, 4/19- 115 on 4/24    Status On-going     CC Long Term Goal  #4   Title Pt will report her pain and discomfort have decreased by 50%   Baseline 03/10/17- pt reports her ROM is improving but her discomfort level hasn't changed   Status On-going     CC Long Term Goal  #5   Title Pt will verbalize a plan to continue her strenthening exercise on her own or in the community to decrease lymphedema and cancer recurrance risk    Status Achieved            Plan - 03/22/17 1249    Clinical Impression Statement Gloria Smith is improving in neck and shoulder pain and feels that she can use her left arm much better.  She continues to have pain and tightness in anterior chest, likely from radtiation.  She will benefit from soft tissue work to this area next session    Rehab Potential Excellent   Clinical Impairments Affecting Rehab Potential  2 axillary lymph nodes removed which were positive, recent axillary radiation    PT Frequency 2x / week   PT Duration 4 weeks   PT Next Visit Plan See if there are questions about strength ABC program. Remeasure left shoulder abduction  do manual techniques to soft tissue of anterior chest, left shoudler and thoracic spine.    Consulted and Agree with Plan of Care Patient      Patient will benefit from skilled therapeutic intervention in order to improve the following deficits and impairments:  Postural dysfunction, Decreased range of motion, Pain, Impaired UE functional use, Increased fascial restricitons, Decreased strength, Decreased knowledge of use of DME, Impaired sensation, Decreased knowledge of precautions, Impaired perceived functional ability  Visit Diagnosis: Stiffness of left shoulder,  not elsewhere classified  Abnormal posture  Disorder of the skin and subcutaneous tissue related to radiation, unspecified  Aftercare following surgery for neoplasm  Pain in joint of left shoulder region     Problem List Patient Active Problem List   Diagnosis Date Noted  . Genetic testing 10/04/2016  . Breast cancer of upper-outer quadrant of left female breast ([place]) 09/10/2016   Donato Heinz. Owens Shark, PT  Norwood Levo 03/22/2017, 12:55 PM  [place], [place], [address] Phone: [phone]   Fax:  [phone]  Name: Gloria Smith MRN: [id number] Date of Birth: [birth year]

## 2017-03-24 ENCOUNTER — Ambulatory Visit: Payer: Managed Care, Other (non HMO) | Admitting: Physical Therapy

## 2017-03-24 DIAGNOSIS — L599 Disorder of the skin and subcutaneous tissue related to radiation, unspecified: Secondary | ICD-10-CM

## 2017-03-24 DIAGNOSIS — M25612 Stiffness of left shoulder, not elsewhere classified: Secondary | ICD-10-CM | POA: Diagnosis not present

## 2017-03-24 DIAGNOSIS — Z483 Aftercare following surgery for neoplasm: Secondary | ICD-10-CM

## 2017-03-24 DIAGNOSIS — M25512 Pain in left shoulder: Secondary | ICD-10-CM

## 2017-03-24 NOTE — Therapy (Signed)
[place], [place], [address] Phone: [phone]   Fax:  [phone]  Physical Therapy Treatment  Patient Details  Name: Gloria Smith MRN: [id number] Date of Birth: [birth year] Referring Provider: Worthy Flank   Encounter Date: 03/24/2017      PT End of Session - 03/24/17 1729    Visit Number 7   Number of Visits 11   Date for PT Re-Evaluation 04/07/17   PT Start Time 1520   PT Stop Time 1600   PT Time Calculation (min) 40 min   Activity Tolerance Patient tolerated treatment well   Behavior During Therapy [facility] for tasks assessed/performed      Past Medical History:  Diagnosis Date  . Headache    ocular migraines - very rare  . Malignant neoplasm of upper-outer quadrant of right female breast ([place]) 09/10/2016   pt states that cancer is on left breast, not right  . Urinary frequency     Past Surgical History:  Procedure Laterality Date  . BREAST LUMPECTOMY WITH RADIOACTIVE SEED AND SENTINEL LYMPH NODE BIOPSY Left 10/05/2016   Procedure: LEFT BREAST RADIOACTIVE SEED GUIDED LUMPECTOMY, LEFT AXILLARY RADIOACTIVE SEED GUIDED NODE EXCISION, LEFT AXILLARY SENTINEL LYMPH NODE BIOPSY(LEFT TARGETED AXILLARY DISSECTION);  Surgeon: Rolm Bookbinder, MD;  Location: [place];  Service: General;  Laterality: Left;  . DILATION AND CURETTAGE OF UTERUS    . right knee surgery at age of 79       There were no vitals filed for this visit.      Subjective Assessment - 03/24/17 1608    Subjective Pt is doing great!    Pertinent History Patient was diagnosed on 09/01/16 with left invasive breast cancer.  It is ER/PR positive, HER2 negative, and has a Ki67 of 5%. It measures 1 cm and is located in the upper outer quadrant. She has a biopsied positive axillary lymph node.  She subsequently went on to surgery on 10/05/16 where she had a left breast lumpectomy with seed guided node evaluation which revealed an grade  1 invasive and low grade in situ ductal carcinoma measuring 1 cm with negative margins. Of the two lymph nodes, both contained metastatic disease with extracapsular extension.  She finished radiation at the end of Feb   Patient Stated Goals to get better return of left shoulder motion    Currently in Pain? No/denies                         [facility] Adult PT Treatment/Exercise - 03/24/17 0001      Shoulder Exercises: Standing   Other Standing Exercises small squat and scapular retraction x 10 reps   Other Standing Exercises left side lunge and trunk rotation left with scapular retraction      Shoulder Exercises: Stretch   Other Shoulder Stretches thoracic spine rotation  stretch in rigtht sidelying and quatduped with    Other Shoulder Stretches on soft foam roller with left arm supported on pillow for pt to get a good anterior chest stretch      Manual Therapy   Soft tissue mobilization in sidelying with biotone to left lattissimus muscle with extra time spent on trigger points    Myofascial Release to tightness in pec major and lattissimus muscles   Passive ROM to left shoulder in supine, especailly in abduction and external rotatio  [facility] Goals - 03/22/17 1253      [place] Term Goal  #1   Title Patient will be knowledgeable about home exercise program for left shoulder ROM and strength    Status Achieved     CC Long Term Goal  #2   Title Pt will acknoledge awareness of lymphedema risk reduction    Status Achieved     CC Long Term Goal  #3   Title Pt will have increase in left shoulder abduction to 140 so that she is able to do self care and household activiites with greater ease    Baseline 105, 4/19- 115 on 4/24    Status On-going     CC Long Term Goal  #4   Title Pt will report her pain and discomfort have decreased by 50%   Baseline 03/10/17- pt reports her ROM is improving but her discomfort level hasn't  changed   Status On-going     CC Long Term Goal  #5   Title Pt will verbalize a plan to continue her strenthening exercise on her own or in the community to decrease lymphedema and cancer recurrance risk    Status Achieved            Plan - 03/24/17 1729    Clinical Impression Statement Bertine is very pleased with the progress she is making, but she still has tightness in anterior chest and posterior axilla that was improved some with manual techniques. She reports she is not having any problems with the exercise program.    Rehab Potential Excellent   Clinical Impairments Affecting Rehab Potential  2 axillary lymph nodes removed which were positive, recent axillary radiation    PT Duration 4 weeks   PT Treatment/Interventions ADLs/Self Care Home Management;Patient/family education;Orthotic Fit/Training;DME Instruction;Taping;Manual techniques;Manual lymph drainage;Therapeutic activities;Therapeutic exercise;Compression bandaging;Scar mobilization;Passive range of motion   PT Next Visit Plan  Remeasure left shoulder abduction  do manual techniques to soft tissue of anterior chest, left shoudler and thoracic spine.  work on soft foam roller for more stretching and core engagment    Consulted and Agree with Plan of Care Patient      Patient will benefit from skilled therapeutic intervention in order to improve the following deficits and impairments:  Postural dysfunction, Decreased range of motion, Pain, Impaired UE functional use, Increased fascial restricitons, Decreased strength, Decreased knowledge of use of DME, Impaired sensation, Decreased knowledge of precautions, Impaired perceived functional ability  Visit Diagnosis: Stiffness of left shoulder, not elsewhere classified  Disorder of the skin and subcutaneous tissue related to radiation, unspecified  Aftercare following surgery for neoplasm  Pain in joint of left shoulder region     Problem List Patient Active Problem List    Diagnosis Date Noted  . Genetic testing 10/04/2016  . Breast cancer of upper-outer quadrant of left female breast ([place]) 09/10/2016   Donato Heinz. Owens Shark PT  Norwood Levo 03/24/2017, 5:33 PM  [place], [place], [address] Phone: [phone]   Fax:  [phone]  Name: JHANIA ETHERINGTON MRN: [id number] Date of Birth: 1971-04-06

## 2017-03-29 ENCOUNTER — Ambulatory Visit: Payer: Managed Care, Other (non HMO) | Admitting: Physical Therapy

## 2017-03-29 DIAGNOSIS — Z483 Aftercare following surgery for neoplasm: Secondary | ICD-10-CM

## 2017-03-29 DIAGNOSIS — L599 Disorder of the skin and subcutaneous tissue related to radiation, unspecified: Secondary | ICD-10-CM

## 2017-03-29 DIAGNOSIS — R293 Abnormal posture: Secondary | ICD-10-CM

## 2017-03-29 DIAGNOSIS — M25612 Stiffness of left shoulder, not elsewhere classified: Secondary | ICD-10-CM | POA: Diagnosis not present

## 2017-03-29 DIAGNOSIS — M25512 Pain in left shoulder: Secondary | ICD-10-CM

## 2017-03-29 NOTE — Therapy (Signed)
[place], [place], [address] Phone: [phone]   Fax:  [phone]  Physical Therapy Treatment  Patient Details  Name: Gloria Smith MRN: [id number] Date of Birth: [birth year] Referring Provider: Worthy Flank   Encounter Date: 03/29/2017      PT End of Session - 03/29/17 1245    Visit Number 8   Date for PT Re-Evaluation 04/07/17   PT Start Time 1025   PT Stop Time 1105   PT Time Calculation (min) 40 min   Activity Tolerance Patient tolerated treatment well   Behavior During Therapy [facility] for tasks assessed/performed      Past Medical History:  Diagnosis Date  . Headache    ocular migraines - very rare  . Malignant neoplasm of upper-outer quadrant of right female breast ([place]) 09/10/2016   pt states that cancer is on left breast, not right  . Urinary frequency     Past Surgical History:  Procedure Laterality Date  . BREAST LUMPECTOMY WITH RADIOACTIVE SEED AND SENTINEL LYMPH NODE BIOPSY Left 10/05/2016   Procedure: LEFT BREAST RADIOACTIVE SEED GUIDED LUMPECTOMY, LEFT AXILLARY RADIOACTIVE SEED GUIDED NODE EXCISION, LEFT AXILLARY SENTINEL LYMPH NODE BIOPSY(LEFT TARGETED AXILLARY DISSECTION);  Surgeon: Rolm Bookbinder, MD;  Location: [place];  Service: General;  Laterality: Left;  . DILATION AND CURETTAGE OF UTERUS    . right knee surgery at age of 45       There were no vitals filed for this visit.      Subjective Assessment - 03/29/17 1026    Subjective Doing good.  Shoulder is doing OK and getting better by the day    Pertinent History Patient was diagnosed on 09/01/16 with left invasive breast cancer.  It is ER/PR positive, HER2 negative, and has a Ki67 of 5%. It measures 1 cm and is located in the upper outer quadrant. She has a biopsied positive axillary lymph node.  She subsequently went on to surgery on 10/05/16 where she had a left breast lumpectomy with seed guided node evaluation  which revealed an grade 1 invasive and low grade in situ ductal carcinoma measuring 1 cm with negative margins. Of the two lymph nodes, both contained metastatic disease with extracapsular extension.  She finished radiation at the end of Feb   Patient Stated Goals to get better return of left shoulder motion    Currently in Pain? No/denies                         [facility] Adult PT Treatment/Exercise - 03/29/17 0001      Knee/Hip Exercises: Standing   Heel Raises Both;10 reps   Walking with Sports Cord monster walk with green theraband aournd knees 15 feet x 2    Other Standing Knee Exercises one legged stand with mini squat with  mirror and dowel rod as cane for balance    Other Standing Knee Exercises standing on airex for bicep curls with 3 # x 10 reps, head turns for balance      Shoulder Exercises: Standing   External Rotation Strengthening;Both;20 reps;Theraband   Theraband Level (Shoulder External Rotation) Level 3 (Green)   Row Strengthening;Left;20 reps;Theraband   Theraband Level (Shoulder Row) Level 3 (Green)   Row Limitations standing low row with 2 mirrors for visual cues for scapular activation    Retraction Strengthening;Both;10 reps  2 miirrors for visual cues    Shoulder Elevation Strengthening;Both;15 reps;Standing  2 pounds  Shoulder Elevation Limitations mirror for cues for symmetry    Other Standing Exercises small squat and scapular retraction x 10 reps   Other Standing Exercises left side lunge and trunk rotation left with scapular retraction      Shoulder Exercises: Stretch   Other Shoulder Stretches on soft foam roller with left arm supported on pillow for pt to get a good anterior chest stretch      Manual Therapy   Myofascial Release to tightness in pec major and lattissimus muscles                PT Education - 03/29/17 1244    Education provided Yes   Education Details upgraded home exericse program.  See instructions section     Person(s) Educated Patient   Methods Explanation;Handout   Comprehension Verbalized understanding;Returned demonstration                [facility] Goals - 03/22/17 1253      CC Long Term Goal  #1   Title Patient will be knowledgeable about home exercise program for left shoulder ROM and strength    Status Achieved     CC Long Term Goal  #2   Title Pt will acknoledge awareness of lymphedema risk reduction    Status Achieved     CC Long Term Goal  #3   Title Pt will have increase in left shoulder abduction to 140 so that she is able to do self care and household activiites with greater ease    Baseline 105, 4/19- 115 on 4/24    Status On-going     CC Long Term Goal  #4   Title Pt will report her pain and discomfort have decreased by 50%   Baseline 03/10/17- pt reports her ROM is improving but her discomfort level hasn't changed   Status On-going     CC Long Term Goal  #5   Title Pt will verbalize a plan to continue her strenthening exercise on her own or in the community to decrease lymphedema and cancer recurrance risk    Status Achieved            Plan - 03/29/17 1245    Clinical Impression Statement Pt continues to progress well.  Upgraded exercise program with emphasis on scapular strenthening with 2 mirrors used for visual feedback of scapular contraction. She continues to have some tightness in anterior chest so will focus on that next session. Pt ready to be discharged next session    Rehab Potential Excellent   Clinical Impairments Affecting Rehab Potential  2 axillary lymph nodes removed which were positive, recent axillary radiation    PT Frequency 2x / week   PT Duration 4 weeks   PT Next Visit Plan  Remeasure left shoulder abduction for goal assessment  Assess all goals and discharge.  do manual techniques to soft tissue of anterior chest, left shoudler and thoracic spine.    Consulted and Agree with Plan of Care Patient      Patient will  benefit from skilled therapeutic intervention in order to improve the following deficits and impairments:  Postural dysfunction, Decreased range of motion, Pain, Impaired UE functional use, Increased fascial restricitons, Decreased strength, Decreased knowledge of use of DME, Impaired sensation, Decreased knowledge of precautions, Impaired perceived functional ability  Visit Diagnosis: Stiffness of left shoulder, not elsewhere classified  Disorder of the skin and subcutaneous tissue related to radiation, unspecified  Aftercare following surgery for neoplasm  Pain  in joint of left shoulder region  Abnormal posture     Problem List Patient Active Problem List   Diagnosis Date Noted  . Genetic testing 10/04/2016  . Breast cancer of upper-outer quadrant of left female breast ([place]) 09/10/2016   Donato Heinz. Owens Shark PT  Norwood Levo 03/29/2017, 12:49 PM  [place], [place], [address] Phone: [phone]   Fax:  [phone]  Name: Gloria Smith MRN: [id number] Date of Birth: December 29, 1970

## 2017-03-29 NOTE — Patient Instructions (Signed)
Monster walks with green theraband   Pull backs with green theraband wrapped around door handle.  Both elbows at side pull hands apart  One legged standing  Mini squat and shoulder roll back  Step to side and lunge , twist to same side with elbow at waist

## 2017-03-31 ENCOUNTER — Encounter: Payer: Self-pay | Admitting: Physical Therapy

## 2017-03-31 ENCOUNTER — Ambulatory Visit: Payer: Managed Care, Other (non HMO) | Admitting: Physical Therapy

## 2017-03-31 DIAGNOSIS — Z170 Estrogen receptor positive status [ER+]: Secondary | ICD-10-CM

## 2017-03-31 DIAGNOSIS — R293 Abnormal posture: Secondary | ICD-10-CM

## 2017-03-31 DIAGNOSIS — M25612 Stiffness of left shoulder, not elsewhere classified: Secondary | ICD-10-CM

## 2017-03-31 DIAGNOSIS — Z483 Aftercare following surgery for neoplasm: Secondary | ICD-10-CM

## 2017-03-31 DIAGNOSIS — M25512 Pain in left shoulder: Secondary | ICD-10-CM

## 2017-03-31 DIAGNOSIS — C50412 Malignant neoplasm of upper-outer quadrant of left female breast: Secondary | ICD-10-CM

## 2017-03-31 DIAGNOSIS — L599 Disorder of the skin and subcutaneous tissue related to radiation, unspecified: Secondary | ICD-10-CM

## 2017-03-31 NOTE — Therapy (Signed)
[place], [place], [address] Phone: [phone]   Fax:  [phone]  Physical Therapy Treatment  Patient Details  Name: Gloria Smith MRN: [id number] Date of Birth: 07/18/1971 Referring Provider: Worthy Flank   Encounter Date: 03/31/2017      PT End of Session - 03/31/17 1041    Visit Number 9   PT Start Time 1016   PT Stop Time 1040   PT Time Calculation (min) 24 min   Activity Tolerance Patient tolerated treatment well   Behavior During Therapy [facility] for tasks assessed/performed      Past Medical History:  Diagnosis Date  . Headache    ocular migraines - very rare  . Malignant neoplasm of upper-outer quadrant of right female breast ([place]) 09/10/2016   pt states that cancer is on left breast, not right  . Urinary frequency     Past Surgical History:  Procedure Laterality Date  . BREAST LUMPECTOMY WITH RADIOACTIVE SEED AND SENTINEL LYMPH NODE BIOPSY Left 10/05/2016   Procedure: LEFT BREAST RADIOACTIVE SEED GUIDED LUMPECTOMY, LEFT AXILLARY RADIOACTIVE SEED GUIDED NODE EXCISION, LEFT AXILLARY SENTINEL LYMPH NODE BIOPSY(LEFT TARGETED AXILLARY DISSECTION);  Surgeon: Rolm Bookbinder, MD;  Location: [facility];  Service: General;  Laterality: Left;  . DILATION AND CURETTAGE OF UTERUS    . right knee surgery at age of 63       There were no vitals filed for this visit.      Subjective Assessment - 03/31/17 1021    Subjective I'm doing great. There is still work to be done on my shoulder but I feel like I can do it on my own. I feel like I'm about 75% better with my shoulder.   Pertinent History Patient was diagnosed on 09/01/16 with left invasive breast cancer.  It is ER/PR positive, HER2 negative, and has a Ki67 of 5%. It measures 1 cm and is located in the upper outer quadrant. She has a biopsied positive axillary lymph node.  She subsequently went on to surgery on 10/05/16 where she had a left  breast lumpectomy with seed guided node evaluation which revealed an grade 1 invasive and low grade in situ ductal carcinoma measuring 1 cm with negative margins. Of the two lymph nodes, both contained metastatic disease with extracapsular extension.  She finished radiation at the end of Feb   Patient Stated Goals to get better return of left shoulder motion    Currently in Pain? No/denies            [facility] PT Assessment - 03/31/17 0001      ROM / Strength   AROM / PROM / Strength AROM     AROM   AROM Assessment Site Cervical   Left Shoulder Extension 50 Degrees   Left Shoulder Flexion 149 Degrees   Left Shoulder ABduction 162 Degrees   Left Shoulder Internal Rotation 70 Degrees   Left Shoulder External Rotation 72 Degrees   Cervical Flexion WNL   Cervical Extension WNL   Cervical - Right Side Bend WNL   Cervical - Left Side Bend WNL   Cervical - Right Rotation WNL   Cervical - Left Rotation WNL     Strength   Overall Strength Within functional limits for tasks performed   Overall Strength Comments Left shoulder all 5/5  PT Education - 03/31/17 1023    Education provided Yes   Education Details Reviewed all HEP and pt reports compliance and competence and verbalizes how to progress.   Person(s) Educated Patient   Methods Explanation   Comprehension Verbalized understanding                [facility] Goals - 03/31/17 1023      CC Long Term Goal  #1   Title Patient will be knowledgeable about home exercise program for left shoulder ROM and strength    Time 4   Period Weeks   Status Achieved     CC Long Term Goal  #2   Title Pt will acknowledge awareness of lymphedema risk reduction    Time 4   Period Weeks   Status Achieved     CC Long Term Goal  #3   Title Pt will have increase in left shoulder abduction to 140 so that she is able to do self care and household activiites with greater ease    Baseline  162 degrees on 03/31/17   Time 4   Period Weeks   Status Achieved     CC Long Term Goal  #4   Title Pt will report her pain and discomfort have decreased by 50%   Time 4   Period Weeks   Status Achieved     CC Long Term Goal  #5   Title Pt will verbalize a plan to continue her strengthening exercise on her own or in the community to decrease lymphedema and cancer recurrance risk    Time 4   Period Weeks   Status Achieved            Plan - 03/31/17 1042    Clinical Impression Statement All goals met. Patient very pleased with her progress and appears to be doing very well. She has returned to all normal activities. She declined further treatment today. She is ready for discharge.   Rehab Potential Excellent   PT Frequency 2x / week   PT Duration 4 weeks   PT Treatment/Interventions ADLs/Self Care Home Management;Patient/family education;Orthotic Fit/Training;DME Instruction;Taping;Manual techniques;Manual lymph drainage;Therapeutic activities;Therapeutic exercise;Compression bandaging;Scar mobilization;Passive range of motion   PT Next Visit Plan Discharge pt. All goals met.   Consulted and Agree with Plan of Care Patient      Patient will benefit from skilled therapeutic intervention in order to improve the following deficits and impairments:  Postural dysfunction, Decreased range of motion, Pain, Impaired UE functional use, Increased fascial restricitons, Decreased strength, Decreased knowledge of use of DME, Impaired sensation, Decreased knowledge of precautions, Impaired perceived functional ability  Visit Diagnosis: Stiffness of left shoulder, not elsewhere classified  Disorder of the skin and subcutaneous tissue related to radiation, unspecified  Aftercare following surgery for neoplasm  Pain in joint of left shoulder region  Abnormal posture  Carcinoma of upper-outer quadrant of left breast in female, estrogen receptor positive ([place])     Problem List Patient  Active Problem List   Diagnosis Date Noted  . Genetic testing 10/04/2016  . Breast cancer of upper-outer quadrant of left female breast ([employer]) 09/10/2016    Annia Friendly, PT 03/31/17 10:44 AM  [place], [place], [address] Phone: [phone]   Fax:  [phone]  Name: DYNA FIGUEREO MRN: [id number] Date of Birth: 1970/12/15  PHYSICAL THERAPY DISCHARGE SUMMARY  Visits from Start of Care: 9  Current functional level related to  goals / functional outcomes: All goals met. See above.   Remaining deficits: None   Education / Equipment: Lymphedema risk reduction.  Plan: Patient agrees to discharge.  Patient goals were met. Patient is being discharged due to meeting the stated rehab goals.  ?????    Annia Friendly, [place] 03/31/17 10:45 AM

## 2017-04-04 NOTE — Patient Instructions (Signed)
Your procedure is scheduled on:  Thursday, Apr 14, 2017  Enter through the [employer] [facility] at:  11:30 AM  Pick up the phone at the desk and dial [phone].  Call this number if you have problems the morning of surgery: [phone].  Remember: Do NOT eat food:  After Midnight Wednesday  Do NOT drink clear liquids after:  9:00 AM day of surgery  Take these medicines the morning of surgery with a SIP OF WATER:  None  Stop ALL herbal medications at this time  Do NOT smoke the day of surgery.  Do NOT wear jewelry (body piercing), metal hair clips/bobby pins, make-up, or nail polish. Do NOT wear lotions, powders, or perfumes.  You may wear deodorant. Do NOT shave for 48 hours prior to surgery. Do NOT bring valuables to the hospital. Contacts, dentures, or bridgework may not be worn into surgery.  Have a responsible adult drive you home and stay with you for 24 hours after your procedure  Bring a copy of your healthcare power of attorney and living will documents.

## 2017-04-05 ENCOUNTER — Other Ambulatory Visit: Payer: Self-pay

## 2017-04-05 ENCOUNTER — Encounter (HOSPITAL_COMMUNITY): Payer: Self-pay

## 2017-04-05 ENCOUNTER — Encounter (HOSPITAL_COMMUNITY)
Admission: RE | Admit: 2017-04-05 | Discharge: 2017-04-05 | Disposition: A | Discharge status: Home / Self Care | Payer: Managed Care, Other (non HMO) | Source: Ambulatory Visit | Attending: Obstetrics | Admitting: Obstetrics

## 2017-04-05 DIAGNOSIS — Z01810 Encounter for preprocedural cardiovascular examination: Secondary | ICD-10-CM | POA: Insufficient documentation

## 2017-04-05 DIAGNOSIS — Z01818 Encounter for other preprocedural examination: Secondary | ICD-10-CM | POA: Diagnosis present

## 2017-04-05 DIAGNOSIS — Z01812 Encounter for preprocedural laboratory examination: Secondary | ICD-10-CM | POA: Diagnosis not present

## 2017-04-05 HISTORY — DX: Unspecified osteoarthritis, unspecified site: M19.90

## 2017-04-05 LAB — CBC
HCT: 41.8 % (ref 36.0–46.0)
HEMOGLOBIN: 13.8 g/dL (ref 12.0–15.0)
MCH: 29.2 pg (ref 26.0–34.0)
MCHC: 33 g/dL (ref 30.0–36.0)
MCV: 88.6 fL (ref 78.0–100.0)
Platelets: 473 10*3/uL — ABNORMAL HIGH (ref 150–400)
RBC: 4.72 MIL/uL (ref 3.87–5.11)
RDW: 13.7 % (ref 11.5–15.5)
WBC: 10.8 10*3/uL — ABNORMAL HIGH (ref 4.0–10.5)

## 2017-04-05 LAB — TYPE AND SCREEN
ABO/RH(D): O POS
Antibody Screen: NEGATIVE

## 2017-04-05 LAB — ABO/RH: ABO/RH(D): O POS

## 2017-04-06 NOTE — Progress Notes (Signed)
CLINIC:  Survivorship   REASON FOR VISIT:  Routine follow-up post-treatment for a recent history of breast cancer.  BRIEF ONCOLOGIC HISTORY:  Oncology History   Breast cancer of upper-outer quadrant of left female breast [facility])   Staging form: Breast, AJCC 7th Edition   - Clinical stage from 09/06/2016: Stage IIA (T1b, N1, M0) - Signed by Truitt Merle, MD on 09/15/2016   - Pathologic stage from 10/05/2016: Stage IIA (T1b, N1a, cM0) - Signed by Truitt Merle, MD on 10/24/2016      Breast cancer of upper-outer quadrant of left female breast ([place])   09/06/2016 Initial Diagnosis    Malignant neoplasm of upper-outer quadrant of right female breast ([place])      09/06/2016 Initial Biopsy    Left breast 3:00 position showed invasive ductal carcinoma and DCIS, left axillary lymph node biopsy showed metastatic carcinoma with extracapsular extension      09/06/2016 Receptors her2    Breast tumor ER 100% positive, PR 100% positive, HER-2 negative, Ki-67 5%      09/06/2016 Mammogram    Diagnostic mammogram and ultrasound showed a 1.0 cm mass at the 3 clock position of left breast, and the enlarged left axillary node with cortical thickening. The 61m cm oval cyst in the right breast is likely benign.      09/06/2016 Miscellaneous    Mammaprint showed low risk luminal A type, with average 10 year risk of recurrence untreated 10%.      09/16/2016 Genetic Testing    Genetic testing was normal, and did not reveal a deleterious mutation  One variant of uncertain significance (VUS) was found in the BRIP1 gene.  Genes tested include: ATM, BARD1, BRCA1, BRCA2, BRIP1, CDH1, CHEK2, FANCC, MLH1, MSH2, MSH6, NBN, PALB2, PMS2, PTEN, RAD51C, RAD51D, TP53, and XRCC2.  This panel also includes deletion/duplication analysis (without sequencing) for one gene, EPCAM.        10/05/2016 Surgery    Left breast lumpectomy and SLN biopsy       10/05/2016 Pathology Results    Left breast lumpectomy showed invasive and  in situ ductal carcinoma, 1 cm, grade 1, margins are negative, 2 sentinel lymph nodes are positive for metastatic carcinoma with extracapsular extension. LVI (+)      11/08/2016 - 12/24/2016 Radiation Therapy    Adjuvant radiation (Dr. MTobe Sos The Left breast was treated to 50.4 Gy in 28 fractions of 1.8 Gy per fraction. 2. The Left axilla was treated to 50.4 Gy in 28 fractions of 1.8 Gy per fraction.  3. The Left breast was boosted to 10 Gy in 5 fractions of 2 Gy per fraction.        01/2017 -  Anti-estrogen oral therapy    Exemestane 228mdaily       INTERVAL HISTORY:  Ms. [facility] our initial meeting to review her survivorship care plan detailing her treatment course for breast cancer, as well as monitoring long-term side effects of that treatment, education regarding health maintenance, screening, and overall wellness and health promotion.     Overall, Gloria Smith reports feeling quite wel.  Her only concern is a rash that she developed on her hands that started over the weekend.  She has tiny little knots under her skin that are very itchy.  She is only taking Exemestane and has been since March, 2018, she hasn't changed any new products as well.      REVIEW OF SYSTEMS:  Review of Systems  Constitutional: Negative for appetite change, chills, diaphoresis, fatigue, fever and unexpected weight change.  HENT:   Negative for hearing loss and lump/mass.   Eyes: Negative for eye problems and icterus.  Respiratory: Negative for chest tightness and cough.   Cardiovascular: Negative for chest pain, leg swelling and palpitations.  Gastrointestinal: Negative for abdominal distention and abdominal pain.  Endocrine: Negative for hot flashes.  Genitourinary: Negative for difficulty urinating, dyspareunia and pelvic pain.   Musculoskeletal: Negative for arthralgias.  Neurological: Negative for dizziness, extremity weakness and headaches.    Hematological: Negative for adenopathy. Does not bruise/bleed easily.  Psychiatric/Behavioral: Negative for depression. The patient is not nervous/anxious.       Breast: Denies any new nodularity, masses, tenderness, nipple changes, or nipple discharge.       ONCOLOGY TREATMENT TEAM:  1. Surgeon:  Dr. Donne Hazel at [facility] 2. Medical Oncologist: Dr. Burr Medico 3. Radiation Oncologist: Dr. Lisbeth Renshaw    PAST MEDICAL/SURGICAL HISTORY:  Past Medical History:  Diagnosis Date  . Arthritis   . Headache    ocular migraines - very rare  . Malignant neoplasm of upper-outer quadrant of right female breast ([place]) 09/10/2016   pt states that cancer is on left breast, not right  . Urinary frequency    Past Surgical History:  Procedure Laterality Date  . BREAST LUMPECTOMY WITH RADIOACTIVE SEED AND SENTINEL LYMPH NODE BIOPSY Left 10/05/2016   Procedure: LEFT BREAST RADIOACTIVE SEED GUIDED LUMPECTOMY, LEFT AXILLARY RADIOACTIVE SEED GUIDED NODE EXCISION, LEFT AXILLARY SENTINEL LYMPH NODE BIOPSY(LEFT TARGETED AXILLARY DISSECTION);  Surgeon: Rolm Bookbinder, MD;  Location: [place];  Service: General;  Laterality: Left;  . DENTAL SURGERY    . DILATION AND CURETTAGE OF UTERUS    . right knee surgery at age of 59        ALLERGIES:  No Known Allergies   CURRENT MEDICATIONS:  Outpatient Encounter Prescriptions as of 04/07/2017  Medication Sig  . exemestane (AROMASIN) 25 MG tablet Take 1 tablet (25 mg total) by mouth daily after breakfast.   No facility-administered encounter medications on file as of 04/07/2017.      ONCOLOGIC FAMILY HISTORY:  Family History  Problem Relation Age of Onset  . Colon cancer Paternal Grandmother 95       d. 38  . Breast cancer Cousin        paternal 1st cousin, once-removed; dx. 58s  . Other Mother        hx precancerous skin finding removed from eyelid at 70-71  . Heart attack Maternal Grandmother 70  . Stroke Maternal Grandfather   . Heart  attack Paternal Grandfather 61  . Other Other 25       hx of precancerous finding on abnormal pap smear  . Cancer Other        maternal great aunt (MGM's sister) d. early 54s; spinal cancer, but this may have been a secondary cancer w/ NOS primary  . Cancer Other        maternal great grandmother (MGM's mother) d. 90s w/ NOS cancer  . Cancer Other        paternal great uncle (PGM's brother) d. 72s w/ NOS cancer     GENETIC COUNSELING/TESTING: Genetic testing was normal, and did not reveal a deleterious mutation  One variant of uncertain significance (VUS) was found in the BRIP1 gene.  Genes tested include: ATM, BARD1, BRCA1, BRCA2, BRIP1, CDH1, CHEK2, FANCC, MLH1, MSH2, MSH6, NBN, PALB2, PMS2, PTEN, RAD51C, RAD51D, TP53, and XRCC2.  This panel  also includes deletion/duplication analysis (without sequencing) for one gene, EPCAM.    SOCIAL HISTORY:  Gloria Smith is divorced and is living with her significant other in [place], [place].  She has 1 child who lives in [place].  Gloria Smith is currently working full time from home for [employer]  She denies any current or history of tobacco, alcohol, or illicit drug use.     PHYSICAL EXAMINATION:  Vital Signs:   Vitals:   04/07/17 0932  BP: 123/66  Pulse: 91  Resp: 18  Temp: 98.2 F (36.8 C)   Filed Weights   04/07/17 0932  Weight: 172 lb 4.8 oz (78.2 kg)   General: Well-nourished, well-appearing female in no acute distress.  She is unaccompanied today.   HEENT: Head is normocephalic.  Pupils equal and reactive to light. Conjunctivae clear without exudate.  Sclerae anicteric. Oral mucosa is pink, moist.  Oropharynx is pink without lesions or erythema.  Lymph: No cervical, supraclavicular, or infraclavicular lymphadenopathy noted on palpation.  Cardiovascular: Regular rate and rhythm.Marland Kitchen Respiratory: Clear to auscultation bilaterally. Chest expansion symmetric; breathing non-labored.  GI: Abdomen soft and round;  non-tender, non-distended. Bowel sounds normoactive.  GU: Deferred.  Neuro: No focal deficits. Steady gait.  Psych: Mood and affect normal and appropriate for situation.  Extremities: No edema. Skin: see rash below  Skin rash above 04/07/2017   LABORATORY DATA:  None for this visit.  DIAGNOSTIC IMAGING:  None for this visit.      ASSESSMENT AND PLAN:  Gloria Smith is a pleasant 46 y.o. female with Stage IIA left breast invasive ductal carcinoma, ER+/PR+/HER2-, diagnosed in 08/2016, treated with lumpectomy, adjuvant radiation therapy, and anti-estrogen therapy with Exemestane + ovarian suppression (bilateral oophorectomy next week).  She presents to the Survivorship Clinic for our initial meeting and routine follow-up post-completion of treatment for breast cancer.    1. Stage IIA left breast cancer:  Gloria Smith is continuing to recover from definitive treatment for breast cancer. She will follow-up with her medical oncologist, Dr. Burr Medico in June, 2018 with history and physical exam per surveillance protocol.  She has a rash (see below regarding recommendations and exemestane) Today, a comprehensive survivorship care plan and treatment summary was reviewed with the patient today detailing her breast cancer diagnosis, treatment course, potential late/long-term effects of treatment, appropriate follow-up care with recommendations for the future, and patient education resources.  A copy of this summary, along with a letter will be sent to the patient's primary care provider via mail/fax/In Basket message after today's visit.    2. Skin rash: unclear of the etiology.  She does work outdoors with gardening.  No new exposures to any lotions, detergent.  This is only on the hands.  She is going to stop exemestane x 2 weeks and then restart.  I sent in a mild steroid cream.  She will let me know if it gets worse.    3. Bone health:  Given Gloria Smith's age/history of breast cancer and her  current treatment regimen including anti-estrogen therapy with Exemestane, she is at risk for bone demineralization.  She is overdue for a DEXA.  In the meantime, she was encouraged to increase her consumption of foods rich in calcium, as well as increase her weight-bearing activities.  She was given education on specific activities to promote bone health.  4. Cancer screening:  Due to Gloria Smith's history and her age, she should receive screening for skin cancers, colon cancer, and gynecologic cancers.  The information and recommendations are listed on the patient's comprehensive care plan/treatment summary and were reviewed in detail with the patient.    5. Health maintenance and wellness promotion: Gloria Smith was encouraged to consume 5-7 servings of fruits and vegetables per day. We reviewed the "Nutrition Rainbow" handout, as well as the handout "Take Control of Your Health and Reduce Your Cancer Risk" from the [employer].  She was also encouraged to engage in moderate to vigorous exercise for 30 minutes per day most days of the week. We discussed the [employer] fitness program, which is designed for cancer survivors to help them become more physically fit after cancer treatments.  She was instructed to limit her alcohol consumption and continue to abstain from tobacco use.     6. Support services/counseling: It is not uncommon for this period of the patient's cancer care trajectory to be one of many emotions and stressors.  We discussed an opportunity for her to participate in the next session of [facility] ("Finding Your New Normal") support group series designed for patients after they have completed treatment.   Gloria Smith was encouraged to take advantage of our many other support services programs, support groups, and/or counseling in coping with her new life as a cancer survivor after completing anti-cancer treatment.  She was offered support today through active listening and  expressive supportive counseling.  She was given information regarding our available services and encouraged to contact me with any questions or for help enrolling in any of our support group/programs.    Dispo:   -Return to cancer center in June, 2018 for evaluation by Dr. Burr Medico -Bone density within the next month  -She is welcome to return back to the Survivorship Clinic at any time; no additional follow-up needed at this time.  -Consider referral back to survivorship as a long-term survivor for continued surveillance  A total of (30) minutes of face-to-face time was spent with this patient with greater than 50% of that time in counseling and care-coordination.   Gardenia Phlegm, NP Survivorship Program [place] [phone]   Note: PRIMARY CARE PROVIDER Aloha Gell, [place] [phone]

## 2017-04-07 ENCOUNTER — Ambulatory Visit (HOSPITAL_BASED_OUTPATIENT_CLINIC_OR_DEPARTMENT_OTHER): Payer: Managed Care, Other (non HMO) | Admitting: Adult Health

## 2017-04-07 ENCOUNTER — Encounter: Payer: Self-pay | Admitting: Adult Health

## 2017-04-07 VITALS — BP 123/66 | HR 91 | Temp 98.2°F | Resp 18 | Ht 64.0 in | Wt 172.3 lb

## 2017-04-07 DIAGNOSIS — Z170 Estrogen receptor positive status [ER+]: Secondary | ICD-10-CM | POA: Diagnosis not present

## 2017-04-07 DIAGNOSIS — C773 Secondary and unspecified malignant neoplasm of axilla and upper limb lymph nodes: Secondary | ICD-10-CM | POA: Diagnosis not present

## 2017-04-07 DIAGNOSIS — R21 Rash and other nonspecific skin eruption: Secondary | ICD-10-CM

## 2017-04-07 DIAGNOSIS — C50412 Malignant neoplasm of upper-outer quadrant of left female breast: Secondary | ICD-10-CM | POA: Diagnosis not present

## 2017-04-07 MED ORDER — BETAMETHASONE DIPROPIONATE AUG 0.05 % EX CREA: TOPICAL_CREAM | Freq: Two times a day (BID) | CUTANEOUS | 0 refills | Status: DC

## 2017-04-07 NOTE — Pre-Procedure Instructions (Signed)
Rash 04/07/17

## 2017-04-08 ENCOUNTER — Encounter: Payer: Self-pay | Admitting: *Deleted

## 2017-04-08 NOTE — Progress Notes (Signed)
[place] Work  Clinical Social Work was referred by NP for assessment of psychosocial needs due financial concerns. Clinical Social Worker contacted patient at home to offer support and assess for needs. CSW introduced self, explained role of CSW/Pt and Family Support Team, support groups and other resources to assist. Pt recently finished treatment and is suffering from many medical bills. CSW reviewed multiple resources to assist. Pt is out of treatment, but with upcoming surgery may qualify for additional assistance. CSW provided pt with contact info for many resources. She plans to review and reach back out as needed.       Clinical Social Work interventions:  Resource education and referral  Loren Racer, LCSW, OSW-C Clinical Social Worker [place]  [place] Phone: [phone] Fax: [phone]

## 2017-04-13 ENCOUNTER — Encounter (HOSPITAL_COMMUNITY): Payer: Self-pay

## 2017-04-14 ENCOUNTER — Ambulatory Visit (HOSPITAL_COMMUNITY): Payer: Managed Care, Other (non HMO) | Admitting: Anesthesiology

## 2017-04-14 ENCOUNTER — Encounter (HOSPITAL_COMMUNITY): Payer: Self-pay

## 2017-04-14 ENCOUNTER — Encounter (HOSPITAL_COMMUNITY)
Admission: RE | Disposition: A | Discharge status: Home / Self Care | Payer: Self-pay | Source: Ambulatory Visit | Attending: Obstetrics

## 2017-04-14 ENCOUNTER — Ambulatory Visit: Payer: Managed Care, Other (non HMO)

## 2017-04-14 ENCOUNTER — Ambulatory Visit (HOSPITAL_COMMUNITY)
Admission: RE | Admit: 2017-04-14 | Discharge: 2017-04-14 | Disposition: A | Discharge status: Home / Self Care | Payer: Managed Care, Other (non HMO) | Source: Ambulatory Visit | Attending: Obstetrics | Admitting: Obstetrics

## 2017-04-14 DIAGNOSIS — Z4002 Encounter for prophylactic removal of ovary: Secondary | ICD-10-CM | POA: Diagnosis not present

## 2017-04-14 DIAGNOSIS — Z853 Personal history of malignant neoplasm of breast: Secondary | ICD-10-CM | POA: Insufficient documentation

## 2017-04-14 DIAGNOSIS — M1990 Unspecified osteoarthritis, unspecified site: Secondary | ICD-10-CM | POA: Insufficient documentation

## 2017-04-14 DIAGNOSIS — R350 Frequency of micturition: Secondary | ICD-10-CM | POA: Diagnosis not present

## 2017-04-14 DIAGNOSIS — Z87891 Personal history of nicotine dependence: Secondary | ICD-10-CM | POA: Insufficient documentation

## 2017-04-14 DIAGNOSIS — G43909 Migraine, unspecified, not intractable, without status migrainosus: Secondary | ICD-10-CM | POA: Insufficient documentation

## 2017-04-14 HISTORY — PX: LAPAROSCOPIC BILATERAL SALPINGO OOPHERECTOMY: SHX5890

## 2017-04-14 LAB — HCG, SERUM, QUALITATIVE: Preg, Serum: NEGATIVE

## 2017-04-14 SURGERY — SALPINGO-OOPHORECTOMY, BILATERAL, LAPAROSCOPIC
Anesthesia: General | Site: Abdomen | Laterality: Bilateral

## 2017-04-14 MED ORDER — ONDANSETRON HCL 4 MG/2ML IJ SOLN
INTRAMUSCULAR | Status: AC
  Filled 2017-04-14: qty 2

## 2017-04-14 MED ORDER — HEPARIN SODIUM (PORCINE) 5000 UNIT/ML IJ SOLN
INTRAMUSCULAR | Status: AC
  Filled 2017-04-14: qty 1

## 2017-04-14 MED ORDER — LIDOCAINE HCL (CARDIAC) 20 MG/ML IV SOLN
INTRAVENOUS | Status: DC | PRN
Start: 2017-04-14 — End: 2017-04-14
  Administered 2017-04-14: 80 mg via INTRAVENOUS

## 2017-04-14 MED ORDER — ONDANSETRON HCL 4 MG/2ML IJ SOLN: 4.0000 mg | Freq: Four times a day (QID) | INTRAMUSCULAR | Status: DC | PRN

## 2017-04-14 MED ORDER — MIDAZOLAM HCL 2 MG/2ML IJ SOLN
INTRAMUSCULAR | Status: AC
  Filled 2017-04-14: qty 2

## 2017-04-14 MED ORDER — PHENYLEPHRINE 40 MCG/ML (10ML) SYRINGE FOR IV PUSH (FOR BLOOD PRESSURE SUPPORT)
PREFILLED_SYRINGE | INTRAVENOUS | Status: AC
  Filled 2017-04-14: qty 10

## 2017-04-14 MED ORDER — SUGAMMADEX SODIUM 200 MG/2ML IV SOLN
INTRAVENOUS | Status: DC | PRN
  Administered 2017-04-14: 160 mg via INTRAVENOUS

## 2017-04-14 MED ORDER — KETOROLAC TROMETHAMINE 30 MG/ML IJ SOLN
INTRAMUSCULAR | Status: AC
  Administered 2017-04-14: 30 mg via INTRAVENOUS
  Filled 2017-04-14: qty 1

## 2017-04-14 MED ORDER — OXYCODONE HCL 5 MG PO TABS: 5.0000 mg | ORAL_TABLET | Freq: Once | ORAL | Status: DC | PRN

## 2017-04-14 MED ORDER — LACTATED RINGERS IV SOLN
INTRAVENOUS | Status: DC
  Administered 2017-04-14 (×2): via INTRAVENOUS

## 2017-04-14 MED ORDER — SCOPOLAMINE 1 MG/3DAYS TD PT72
1.0000 | MEDICATED_PATCH | Freq: Once | TRANSDERMAL | Status: DC
  Administered 2017-04-14: 1.5 mg via TRANSDERMAL

## 2017-04-14 MED ORDER — EPHEDRINE 5 MG/ML INJ
INTRAVENOUS | Status: AC
  Filled 2017-04-14: qty 10

## 2017-04-14 MED ORDER — EPHEDRINE SULFATE 50 MG/ML IJ SOLN
INTRAMUSCULAR | Status: DC | PRN
  Administered 2017-04-14: 5 mg via INTRAVENOUS

## 2017-04-14 MED ORDER — FENTANYL CITRATE (PF) 250 MCG/5ML IJ SOLN
INTRAMUSCULAR | Status: AC
  Filled 2017-04-14: qty 5

## 2017-04-14 MED ORDER — HYDROMORPHONE HCL 1 MG/ML IJ SOLN: 0.2500 mg | INTRAMUSCULAR | Status: DC | PRN

## 2017-04-14 MED ORDER — ROCURONIUM BROMIDE 100 MG/10ML IV SOLN
INTRAVENOUS | Status: AC
  Filled 2017-04-14: qty 1

## 2017-04-14 MED ORDER — PROPOFOL 10 MG/ML IV BOLUS
INTRAVENOUS | Status: DC | PRN
  Administered 2017-04-14: 160 mg via INTRAVENOUS

## 2017-04-14 MED ORDER — ONDANSETRON HCL 4 MG/2ML IJ SOLN
INTRAMUSCULAR | Status: DC | PRN
  Administered 2017-04-14: 4 mg via INTRAVENOUS

## 2017-04-14 MED ORDER — PROPOFOL 10 MG/ML IV BOLUS
INTRAVENOUS | Status: AC
  Filled 2017-04-14: qty 20

## 2017-04-14 MED ORDER — BUPIVACAINE HCL (PF) 0.25 % IJ SOLN
INTRAMUSCULAR | Status: AC
  Filled 2017-04-14: qty 30

## 2017-04-14 MED ORDER — BUPIVACAINE HCL (PF) 0.25 % IJ SOLN
INTRAMUSCULAR | Status: DC | PRN
  Administered 2017-04-14: 30 mL

## 2017-04-14 MED ORDER — LIDOCAINE HCL (CARDIAC) 20 MG/ML IV SOLN
INTRAVENOUS | Status: AC
Start: 2017-04-14 — End: 2017-04-14
  Filled 2017-04-14: qty 5

## 2017-04-14 MED ORDER — KETOROLAC TROMETHAMINE 30 MG/ML IJ SOLN
INTRAMUSCULAR | Status: AC
  Filled 2017-04-14: qty 1

## 2017-04-14 MED ORDER — DEXAMETHASONE SODIUM PHOSPHATE 4 MG/ML IJ SOLN
INTRAMUSCULAR | Status: DC | PRN
  Administered 2017-04-14: 4 mg via INTRAVENOUS

## 2017-04-14 MED ORDER — DEXAMETHASONE SODIUM PHOSPHATE 4 MG/ML IJ SOLN
INTRAMUSCULAR | Status: AC
  Filled 2017-04-14: qty 1

## 2017-04-14 MED ORDER — OXYCODONE-ACETAMINOPHEN 5-325 MG PO TABS: 1.0000 | ORAL_TABLET | ORAL | 0 refills | Status: DC | PRN

## 2017-04-14 MED ORDER — OXYCODONE HCL 5 MG/5ML PO SOLN: 5.0000 mg | Freq: Once | ORAL | Status: DC | PRN

## 2017-04-14 MED ORDER — KETOROLAC TROMETHAMINE 30 MG/ML IJ SOLN
30.0000 mg | Freq: Once | INTRAMUSCULAR | Status: AC
  Administered 2017-04-14: 30 mg via INTRAVENOUS

## 2017-04-14 MED ORDER — FENTANYL CITRATE (PF) 100 MCG/2ML IJ SOLN
INTRAMUSCULAR | Status: DC | PRN
  Administered 2017-04-14: 100 ug via INTRAVENOUS
  Administered 2017-04-14: 50 ug via INTRAVENOUS
  Administered 2017-04-14: 100 ug via INTRAVENOUS

## 2017-04-14 MED ORDER — SCOPOLAMINE 1 MG/3DAYS TD PT72
MEDICATED_PATCH | TRANSDERMAL | Status: AC
  Administered 2017-04-14: 1.5 mg via TRANSDERMAL
  Filled 2017-04-14: qty 1

## 2017-04-14 MED ORDER — PHENYLEPHRINE HCL 10 MG/ML IJ SOLN
INTRAMUSCULAR | Status: DC | PRN
  Administered 2017-04-14: 80 ug via INTRAVENOUS
  Administered 2017-04-14: 100 ug via INTRAVENOUS
  Administered 2017-04-14: 80 ug via INTRAVENOUS
  Administered 2017-04-14: 100 ug via INTRAVENOUS

## 2017-04-14 MED ORDER — ROCURONIUM BROMIDE 100 MG/10ML IV SOLN
INTRAVENOUS | Status: DC | PRN
  Administered 2017-04-14: 40 mg via INTRAVENOUS

## 2017-04-14 MED ORDER — MIDAZOLAM HCL 2 MG/2ML IJ SOLN
INTRAMUSCULAR | Status: DC | PRN
  Administered 2017-04-14: 2 mg via INTRAVENOUS

## 2017-04-14 SURGICAL SUPPLY — 42 items
BARRIER ADHS 3X4 INTERCEED (GAUZE/BANDAGES/DRESSINGS) IMPLANT
BLADE SURG 15 STRL LF C SS BP (BLADE) ×1 IMPLANT
BLADE SURG 15 STRL SS (BLADE) ×2
CABLE HIGH FREQUENCY MONO STRZ (ELECTRODE) IMPLANT
CLOTH BEACON ORANGE TIMEOUT ST (SAFETY) ×3 IMPLANT
COVER MAYO STAND STRL (DRAPES) IMPLANT
DERMABOND ADVANCED (GAUZE/BANDAGES/DRESSINGS) ×2
DERMABOND ADVANCED .7 DNX12 (GAUZE/BANDAGES/DRESSINGS) ×1 IMPLANT
DRSG OPSITE POSTOP 3X4 (GAUZE/BANDAGES/DRESSINGS) ×3 IMPLANT
DURAPREP 26ML APPLICATOR (WOUND CARE) ×3 IMPLANT
ELECT NEEDLE TIP 2.8 STRL (NEEDLE) IMPLANT
FORCEPS CUTTING 33CM 5MM (CUTTING FORCEPS) ×3 IMPLANT
GLOVE BIO SURGEON STRL SZ 6.5 (GLOVE) ×2 IMPLANT
GLOVE BIO SURGEONS STRL SZ 6.5 (GLOVE) ×1
GLOVE BIOGEL PI IND STRL 7.0 (GLOVE) ×2 IMPLANT
GLOVE BIOGEL PI INDICATOR 7.0 (GLOVE) ×4
GOWN STRL REUS W/TWL LRG LVL3 (GOWN DISPOSABLE) ×9 IMPLANT
MANIPULATOR UTERINE 4.5 ZUMI (MISCELLANEOUS) ×3 IMPLANT
NEEDLE INSUFFLATION 120MM (ENDOMECHANICALS) ×3 IMPLANT
PACK LAPAROSCOPY BASIN (CUSTOM PROCEDURE TRAY) ×3 IMPLANT
PACK TRENDGUARD 450 HYBRID PRO (MISCELLANEOUS) ×1 IMPLANT
PACK TRENDGUARD 600 HYBRD PROC (MISCELLANEOUS) IMPLANT
PENCIL BUTTON HOLSTER BLD 10FT (ELECTRODE) IMPLANT
POUCH SPECIMEN RETRIEVAL 10MM (ENDOMECHANICALS) ×3 IMPLANT
PROTECTOR NERVE ULNAR (MISCELLANEOUS) ×6 IMPLANT
SCISSORS LAP 5X35 DISP (ENDOMECHANICALS) IMPLANT
SEALER TISSUE G2 CVD JAW 35 (ENDOMECHANICALS) IMPLANT
SEALER TISSUE G2 CVD JAW 45CM (ENDOMECHANICALS)
SET IRRIG TUBING LAPAROSCOPIC (IRRIGATION / IRRIGATOR) ×3 IMPLANT
SLEEVE XCEL OPT CAN 5 100 (ENDOMECHANICALS) ×6 IMPLANT
SOLUTION ELECTROLUBE (MISCELLANEOUS) IMPLANT
SUT VICRYL 0 UR6 27IN ABS (SUTURE) IMPLANT
SUT VICRYL 4-0 PS2 18IN ABS (SUTURE) ×6 IMPLANT
SYR 5ML LL (SYRINGE) IMPLANT
TOWEL OR 17X24 6PK STRL BLUE (TOWEL DISPOSABLE) ×6 IMPLANT
TRAY FOLEY CATH SILVER 14FR (SET/KITS/TRAYS/PACK) ×3 IMPLANT
TRENDGUARD 450 HYBRID PRO PACK (MISCELLANEOUS) ×3
TRENDGUARD 600 HYBRID PROC PK (MISCELLANEOUS)
TROCAR BALLN 12MMX100 BLUNT (TROCAR) IMPLANT
TROCAR XCEL NON-BLD 11X100MML (ENDOMECHANICALS) ×3 IMPLANT
TROCAR XCEL NON-BLD 5MMX100MML (ENDOMECHANICALS) ×6 IMPLANT
WARMER LAPAROSCOPE (MISCELLANEOUS) ×3 IMPLANT

## 2017-04-14 NOTE — Discharge Instructions (Signed)
DISCHARGE INSTRUCTIONS: Laparoscopy  The following instructions have been prepared to help you care for yourself upon your return home today.  Wound care:  Do not get the incision wet for the first 24 hours. The incision should be kept clean and dry.  The Band-Aids or dressings may be removed the day after surgery.  Should the incision become sore, red, and swollen after the first week, check with your doctor.  Personal hygiene:  Shower the day after your procedure.  Activity and limitations:  Do NOT drive or operate any equipment today.  Do NOT lift anything more than 15 pounds for 2-3 weeks after surgery.  Do NOT rest in bed all day.  Walking is encouraged. Walk each day, starting slowly with 5-minute walks 3 or 4 times a day. Slowly increase the length of your walks.  Walk up and down stairs slowly.  Do NOT do strenuous activities, such as golfing, playing tennis, bowling, running, biking, weight lifting, gardening, mowing, or vacuuming for 2-4 weeks. Ask your doctor when it is okay to start.  Diet: Eat a light meal as desired this evening. You may resume your usual diet tomorrow.  Return to work: This is dependent on the type of work you do. For the most part you can return to a desk job within a week of surgery. If you are more active at work, please discuss this with your doctor.  What to expect after your surgery: You may have a slight burning sensation when you urinate on the first day. You may have a very small amount of blood in the urine. Expect to have a small amount of vaginal discharge/light bleeding for 1-2 weeks. It is not unusual to have abdominal soreness and bruising for up to 2 weeks. You may be tired and need more rest for about 1 week. You may experience shoulder pain for 24-72 hours. Lying flat in bed may relieve it.  Call your doctor for any of the following:  Develop a fever of 100.4 or greater  Inability to urinate 6 hours after discharge from  hospital  Severe pain not relieved by pain medications  Persistent of heavy bleeding at incision site  Redness or swelling around incision site after a week  Increasing nausea or vomiting  Patient Signature________________________________________ Nurse Signature_________________________________________   NO IBUPROFEN PRODUCTS (MOTRIN, ADVIL) OR ALEVE UNTIL 6:00PM TODAY.   Post Anesthesia Home Care Instructions  Activity: Get plenty of rest for the remainder of the day. A responsible individual must stay with you for 24 hours following the procedure.  For the next 24 hours, DO NOT: -Drive a car -Paediatric nurse -Drink alcoholic beverages -Take any medication unless instructed by your physician -Make any legal decisions or sign important papers.  Meals: Start with liquid foods such as gelatin or soup. Progress to regular foods as tolerated. Avoid greasy, spicy, heavy foods. If nausea and/or vomiting occur, drink only clear liquids until the nausea and/or vomiting subsides. Call your physician if vomiting continues.  Special Instructions/Symptoms: Your throat may feel dry or sore from the anesthesia or the breathing tube placed in your throat during surgery. If this causes discomfort, gargle with warm salt water. The discomfort should disappear within 24 hours.  If you had a scopolamine patch placed behind your ear for the management of post- operative nausea and/or vomiting:  1. The medication in the patch is effective for 72 hours, after which it should be removed.  Wrap patch in a tissue and discard in the  trash. Wash hands thoroughly with soap and water. °2. You may remove the patch earlier than 72 hours if you experience unpleasant side effects which may include dry mouth, dizziness or visual disturbances. °3. Avoid touching the patch. Wash your hands with soap and water after contact with the patch. °  ° °

## 2017-04-14 NOTE — Transfer of Care (Signed)
Immediate Anesthesia Transfer of Care Note  Patient: Gloria Smith  Procedure(s) Performed: Procedure(s): LAPAROSCOPIC BILATERAL SALPINGO OOPHORECTOMY (Bilateral)  Patient Location: PACU  Anesthesia Type:General  Level of Consciousness: awake, alert , oriented and patient cooperative  Airway & Oxygen Therapy: Patient Spontanous Breathing  Post-op Assessment: Report given to RN and Post -op Vital signs reviewed and stable  Post vital signs: Reviewed and stable  Last Vitals:  Vitals:   04/14/17 1133  BP: 103/83  Pulse: 97  Resp: 16  Temp: 36.8 C    Last Pain:  Vitals:   04/14/17 1133  TempSrc: Oral      Patients Stated Pain Goal: 3 (15/05[birth year] 7948)  Complications: No apparent anesthesia complications

## 2017-04-14 NOTE — Anesthesia Procedure Notes (Addendum)
Procedure Name: Intubation Date/Time: 04/14/2017 12:53 PM Performed by: Raenette Rover Pre-anesthesia Checklist: Patient identified, Emergency Drugs available, Suction available and Patient being monitored Patient Re-evaluated:Patient Re-evaluated prior to inductionOxygen Delivery Method: Circle system utilized Preoxygenation: Pre-oxygenation with 100% oxygen Intubation Type: IV induction Ventilation: Mask ventilation without difficulty Laryngoscope Size: Mac and 3 Grade View: Grade I Tube type: Oral Tube size: 7.0 mm Number of attempts: 1 Airway Equipment and Method: Stylet Placement Confirmation: ETT inserted through vocal cords under direct vision,  positive ETCO2,  CO2 detector and breath sounds checked- equal and bilateral Secured at: 21 cm Tube secured with: Tape Dental Injury: Teeth and Oropharynx as per pre-operative assessment

## 2017-04-14 NOTE — Anesthesia Preprocedure Evaluation (Signed)
Anesthesia Evaluation  Patient identified by MRN, date of birth, ID band Patient awake    Reviewed: Allergy & Precautions, H&P , NPO status , Patient's Chart, lab work & pertinent test results  Airway Mallampati: II   Neck ROM: full    Dental   Pulmonary former smoker,    breath sounds clear to auscultation       Cardiovascular negative cardio ROS   Rhythm:regular Rate:Normal     Neuro/Psych  Headaches,    GI/Hepatic   Endo/Other    Renal/GU      Musculoskeletal  (+) Arthritis ,   Abdominal   Peds  Hematology   Anesthesia Other Findings   Reproductive/Obstetrics H/o breast CA                             Anesthesia Physical Anesthesia Plan  ASA: II  Anesthesia Plan: General   Post-op Pain Management:    Induction: Intravenous  Airway Management Planned: Oral ETT  Additional Equipment:   Intra-op Plan:   Post-operative Plan: Extubation in OR  Informed Consent: I have reviewed the patients History and Physical, chart, labs and discussed the procedure including the risks, benefits and alternatives for the proposed anesthesia with the patient or authorized representative who has indicated his/her understanding and acceptance.     Plan Discussed with: CRNA, Anesthesiologist and Surgeon  Anesthesia Plan Comments:         Anesthesia Quick Evaluation

## 2017-04-14 NOTE — H&P (Signed)
CC; breast CA, for lap BSO  46 yo with L sided breast CA, plannign BSO for risk reduction, pt has decided against hysterectomy.  Past Medical History:  Diagnosis Date  . Arthritis   . Headache    ocular migraines - very rare  . Malignant neoplasm of upper-outer quadrant of right female breast ([place]) 09/10/2016   pt states that cancer is on left breast, not right  . Urinary frequency     Past Surgical History:  Procedure Laterality Date  . BREAST LUMPECTOMY WITH RADIOACTIVE SEED AND SENTINEL LYMPH NODE BIOPSY Left 10/05/2016   Procedure: LEFT BREAST RADIOACTIVE SEED GUIDED LUMPECTOMY, LEFT AXILLARY RADIOACTIVE SEED GUIDED NODE EXCISION, LEFT AXILLARY SENTINEL LYMPH NODE BIOPSY(LEFT TARGETED AXILLARY DISSECTION);  Surgeon: Rolm Bookbinder, MD;  Location: [facility];  Service: General;  Laterality: Left;  . DENTAL SURGERY    . DILATION AND CURETTAGE OF UTERUS    . right knee surgery at age of 39       All: none  PE:  Gen: well appearing, no distress Abd: soft, NT, ND GU: def to OR  CBC    Component Value Date/Time   WBC 10.8 (H) 04/05/2017 0900   RBC 4.72 04/05/2017 0900   HGB 13.8 04/05/2017 0900   HGB 14.0 02/17/2017 1250   HCT 41.8 04/05/2017 0900   HCT 43.1 02/17/2017 1250   PLT 473 (H) 04/05/2017 0900   PLT 409 (H) 02/17/2017 1250   MCV 88.6 04/05/2017 0900   MCV 88.9 02/17/2017 1250   MCH 29.2 04/05/2017 0900   MCHC 33.0 04/05/2017 0900   RDW 13.7 04/05/2017 0900   RDW 13.4 02/17/2017 1250   LYMPHSABS 1.9 02/17/2017 1250   MONOABS 0.8 02/17/2017 1250   EOSABS 0.1 02/17/2017 1250   BASOSABS 0.0 02/17/2017 1250     A/P lap BSO, r/B d/w with pt.   Nancy Arvin A. 04/14/2017 12:41 PM

## 2017-04-14 NOTE — Brief Op Note (Signed)
04/14/2017  2:13 PM  PATIENT:  Gloria Smith  46 y.o. female  PRE-OPERATIVE DIAGNOSIS:  Breast Cancer  POST-OPERATIVE DIAGNOSIS:  BREAST CANCER   PROCEDURE:  Procedure(s): LAPAROSCOPIC BILATERAL SALPINGO OOPHORECTOMY (Bilateral)  SURGEON:  Surgeon(s) and Role:    Aloha Gell, MD - Primary  PHYSICIAN ASSISTANT:   ASSISTANTS: Mel Almond, CNM    ANESTHESIA:   general  EBL:  Total I/O In: 1000 [I.V.:1000] Out: 25 [Urine:20; Blood:5]  BLOOD ADMINISTERED:none  DRAINS: Urinary Catheter (Foley)   LOCAL MEDICATIONS USED:  MARCAINE     SPECIMEN:  Source of Specimen:  b/l tubes and ovaries  DISPOSITION OF SPECIMEN:  PATHOLOGY  COUNTS:  YES  TOURNIQUET:  * No tourniquets in log *  DICTATION: .Note written in EPIC  PLAN OF CARE: Discharge to home after PACU  PATIENT DISPOSITION:  PACU - hemodynamically stable.   Delay start of Pharmacological VTE agent (>24hrs) due to surgical blood loss or risk of bleeding: yes

## 2017-04-14 NOTE — Op Note (Signed)
04/14/2017  2:13 PM  PATIENT:  Gloria Smith  46 y.o. female  PRE-OPERATIVE DIAGNOSIS:  Breast Cancer  POST-OPERATIVE DIAGNOSIS:  BREAST CANCER   PROCEDURE:  Procedure(s): LAPAROSCOPIC BILATERAL SALPINGO OOPHORECTOMY (Bilateral)  SURGEON:  Surgeon(s) and Role:    Aloha Gell, MD - Primary  PHYSICIAN ASSISTANT:   ASSISTANTS: Mel Almond, CNM    ANESTHESIA:   general  EBL:  Total I/O In: 1000 [I.V.:1000] Out: 25 [Urine:20; Blood:5]  BLOOD ADMINISTERED:none  DRAINS: Urinary Catheter (Foley)   LOCAL MEDICATIONS USED:  MARCAINE     SPECIMEN:  Source of Specimen:  b/l tubes and ovaries  DISPOSITION OF SPECIMEN:  PATHOLOGY  COUNTS:  YES  TOURNIQUET:  * No tourniquets in log *  DICTATION: .Note written in EPIC  PLAN OF CARE: Discharge to home after PACU  PATIENT DISPOSITION:  PACU - hemodynamically stable.   Delay start of Pharmacological VTE agent (>24hrs) due to surgical blood loss or risk of bleeding: yes    Antibiotics: none Complications: none  INDICATIONS: 46 yo NP pt with recent diagnosis of breast cancer here for risk reducing BSO.  Risks of procedure discussed with patient including bleeding, infection, damage to surrounding organs.     FINDINGS:  Nl liver edge, nl gallbladder, nl omentum, nl b/l tubes and ovaries, nl uterus, small adhesions colon in LLQ.    TECHNIQUE:  The patient was taken to the operating room where general anesthesia was obtained without difficulty.  She was then placed in the dorsal lithotomy position and prepared and draped in sterile fashion.  After an adequate timeout was performed, a foley catheter was placed and  a bimanual exam was done to assess the size and position of the uterus. A bivalved speculum was then placed in the patient's vagina, and the anterior lip of cervix grasped with the single-tooth tenaculum.  The cervix was serially dilated with Kennon Rounds dilators to #19. Zumi uterine manipulator was not able to be advanced  and decision made to proceed with Hulka tenaculum. he speculum was removed from the vagina.  Attention was then turned to the patient's abdomen where a 10-mm skin incision was made on the umbilical fold after first injecting with 1/4% marcaine.  The Veress needle was carefully introduced into the peritoneal cavity through the abdominal wall.  Intraperitoneal placement was confirmed with a saline filled syringe and by drop in intraabdominal pressure with insufflation of carbon dioxide gas.  Adequate pneumoperitoneum was obtained, and the 10-mm non-bladed optiview trocar and sleeve were then advanced without difficulty into the abdomen where intraabdominal placement was confirmed by the operative laparoscope. A survey of the patient's pelvis and abdomen revealed findings as above.  5 mm skin incisions were made in the left and right lower quadrant after injecting with anasthetic. Non-bladed trocars were placed under direct visualization. Using blunt and grasping instruments, anatomy was surveyed with findings as above.  After identifying the R ureter, the R IP ligament was serially cauterized and transected. Starting at the fimbriated end, the R fallopian tube was transected by serially cauterizing and trisecting the mesosalpinx. The R uteroovarian ligament and cauterized and transected the the R tube and ovary was placed in the posterior cul-de-sac. Attempt was made to identify the Left ureter, this was not visualized. Given the ovary and IP could be pulled free from the pelvic side wall, no further attempt to see the ureter was done. The Left  IP ligament was serially cauterized and transected. Starting at the fibriated end, the L  fallopian tube was transected by serially cauterizing and trasecting the mesosapynx. The L uteroovarian ligament was cauterized and transected the the L tube and ovary was placed in the posterior cul-de-sac.   A 5 mm camera was placed through the RLQ port, an endocatch bag placed  through the umbilical port and using grasping instruments the specimens were placed in the bag, removed though the umbilicus and sent to pathology. Nezhat suction irrigation was carried out. 18 cc 1/4% plain marcaine was placed over the surgical sites.   Pneumoperitoneum removed, sites still hemostatic. Instruments and ports removed.   Umbilical fascial incision did not need closure due to the small size and non-bladed ports. 4-0 vicryl used for skin incisions. Dermabond applied by nurse.   Tenaculum removed, site hemostatic.  Catheter removed.   The patient tolerated the procedure well.  Sponge, lap, and needle counts were correct times two.  The patient was then taken to the recovery room awake, extubated and  in stable condition.  Skyley Grandmaison A. 11/06/2013 12:19 PM

## 2017-04-15 ENCOUNTER — Encounter (HOSPITAL_COMMUNITY): Payer: Self-pay | Admitting: Obstetrics

## 2017-04-15 NOTE — Anesthesia Postprocedure Evaluation (Signed)
Anesthesia Post Note  Patient: Gloria Smith  Procedure(s) Performed: Procedure(s) (LRB): LAPAROSCOPIC BILATERAL SALPINGO OOPHORECTOMY (Bilateral)  Patient location during evaluation: PACU Anesthesia Type: General Level of consciousness: awake and alert and patient cooperative Pain management: pain level controlled Vital Signs Assessment: post-procedure vital signs reviewed and stable Respiratory status: spontaneous breathing and respiratory function stable Cardiovascular status: stable Anesthetic complications: no       Last Vitals:  Vitals:   04/14/17 1515 04/14/17 1545  BP:  116/63  Pulse:  70  Resp:  20  Temp: 36.8 C     Last Pain:  Vitals:   04/14/17 1133  TempSrc: Oral                 Torina Ey S

## 2017-04-28 ENCOUNTER — Telehealth: Payer: Self-pay | Admitting: *Deleted

## 2017-04-28 NOTE — Telephone Encounter (Signed)
Received vm call from [place] office stating that pt had ovaries removed & Dr Pamala Hurry would like to try Paxil or Brisdelle for hot flashes if Dr Burr Medico approves.  Message to Dr Burr Medico.  Call back # [phone].

## 2017-04-28 NOTE — Telephone Encounter (Signed)
Called Kristen & informed OK for paxil/Brisdelle per Dr Burr Medico.

## 2017-05-04 NOTE — Progress Notes (Signed)
[place]  Telephone:([phone]) [phone] Fax:([phone]) [phone]  Clinic Follow Up Note   Patient Care Team: Aloha Gell, MD as PCP - General (Obstetrics and Gynecology) Kyung Rudd, MD as Consulting Physician (Radiation Oncology) Truitt Merle, MD as Consulting Physician (Hematology) Rolm Bookbinder, MD as Consulting Physician (General Surgery) Gardenia Phlegm, NP as Nurse Practitioner (Hematology and Oncology) 05/09/2017  CHIEF COMPLAINTS:  Follow up left breast cancer  Oncology History   Breast cancer of upper-outer quadrant of left female breast [facility])   Staging form: Breast, AJCC 7th Edition   - Clinical stage from 09/06/2016: Stage IIA (T1b, N1, M0) - Signed by Truitt Merle, MD on 09/15/2016   - Pathologic stage from 10/05/2016: Stage IIA (T1b, N1a, cM0) - Signed by Truitt Merle, MD on 10/24/2016      Breast cancer of upper-outer quadrant of left female breast ([place])   09/06/2016 Initial Diagnosis    Malignant neoplasm of upper-outer quadrant of right female breast ([place])      09/06/2016 Initial Biopsy    Left breast 3:00 position showed invasive ductal carcinoma and DCIS, left axillary lymph node biopsy showed metastatic carcinoma with extracapsular extension      09/06/2016 Receptors her2    Breast tumor ER 100% positive, PR 100% positive, HER-2 negative, Ki-67 5%      09/06/2016 Mammogram    Diagnostic mammogram and ultrasound showed a 1.0 cm mass at the 3 clock position of left breast, and the enlarged left axillary node with cortical thickening. The 41m cm oval cyst in the right breast is likely benign.      09/06/2016 Miscellaneous    Mammaprint showed low risk luminal A type, with average 10 year risk of recurrence untreated 10%.      09/16/2016 Genetic Testing    Genetic testing was normal, and did not reveal a deleterious mutation  One variant of uncertain significance (VUS) was found in the BRIP1 gene.  Genes tested include: ATM, BARD1, BRCA1,  BRCA2, BRIP1, CDH1, CHEK2, FANCC, MLH1, MSH2, MSH6, NBN, PALB2, PMS2, PTEN, RAD51C, RAD51D, TP53, and XRCC2.  This panel also includes deletion/duplication analysis (without sequencing) for one gene, EPCAM.        10/05/2016 Surgery    Left breast lumpectomy and SLN biopsy       10/05/2016 Pathology Results    Left breast lumpectomy showed invasive and in situ ductal carcinoma, 1 cm, grade 1, margins are negative, 2 sentinel lymph nodes are positive for metastatic carcinoma with extracapsular extension. LVI (+)      11/08/2016 - 12/24/2016 Radiation Therapy    Adjuvant radiation (Dr. MTobe Sos The Left breast was treated to 50.4 Gy in 28 fractions of 1.8 Gy per fraction. 2. The Left axilla was treated to 50.4 Gy in 28 fractions of 1.8 Gy per fraction.  3. The Left breast was boosted to 10 Gy in 5 fractions of 2 Gy per fraction.        01/10/2017 -  Anti-estrogen oral therapy    Exemestane 295mdaily      04/14/2017 Surgery    LAPAROSCOPIC BILATERAL SALPINGO OOPHORECTOMY by Dr. FoPamala Hurry     HISTORY OF PRESENTING ILLNESS:  Gloria HOFF89.o. female is here because of her recently diagnosed left breast cancer. She presents to our multidisciplinary breast clinic with her husband.    She had screening mammogram on 09/01/2016 at her gynecologist office, which showed a bilateral breast nodules. Her previous mammogram was 4 years ago which was  normal. She denies any significant symptoms, except occasional mild left breast shooting pain for the past few months, no palpable mass, skin change or nipple discharge. She feels well overall. She gained about 20lbs in the past 6 months   She was referred to [facility] for diagnostic mammogram and ultrasound, which showed a benign 19m cyst in the right breast, and a 1 cm mass in the 3:00 position of left breast, and I enlarged left axillary node. Both the left breast mass and axillary node biopsy showed invasive ductal carcinoma, ER/PR strongly positive,  HER-2 negative.  GYN HISTORY  Menarchal: 12 LMP: spotting 08/2016, she has IUD ([name]:  HRT: n/a G4P1: she has 120yo son   CURRENT THERAPY: Exemestane 25 mg Daily started 01/10/17   INTERIM HISTORY:  SSHANTELLA BLUBAUGHreturns for follow up. She presents to the clinic today. She returns after her BSO and she reports to have recovered well. She has worsening hot flashes. She is on Paxil to help and she feels there is been a slight improvement. The hot flashes are worse then when she was getting her treatment.  She has occasional mood swings but tolerable.  She has redness on her hands and she is just using moisturizer and the rash has not returned.  She reports to doing well on exemestane.     MEDICAL HISTORY:  Past Medical History:  Diagnosis Date  . Arthritis   . Headache    ocular migraines - very rare  . [place]ignant [place]   pt states that cancer is on left breast, not right  . Urinary frequency     SURGICAL HISTORY: Past Surgical History:  Procedure Laterality Date  . BREAST LUMPECTOMY WITH RADIOACTIVE SEED AND SENTINEL LYMPH NODE BIOPSY Left 10/05/2016   Procedure: LEFT BREAST RADIOACTIVE SEED GUIDED LUMPECTOMY, LEFT AXILLARY RADIOACTIVE SEED GUIDED NODE EXCISION, LEFT AXILLARY SENTINEL LYMPH NODE BIOPSY(LEFT TARGETED AXILLARY DISSECTION);  Surgeon: MRolm Bookbinder MD;  [place]  Service: General;  Laterality: Left;  . DENTAL SURGERY    . DILATION AND CURETTAGE OF UTERUS    . LAPAROSCOPIC BILATERAL SALPINGO OOPHERECTOMY Bilateral 04/14/2017   Procedure: LAPAROSCOPIC BILATERAL SALPINGO OOPHORECTOMY;  Surgeon: FAloha Gell MD;  [facility];  Service: Gynecology;  Laterality: Bilateral;  . right knee surgery at age of 89      SOCIAL HISTORY: Social History   Social History  . Marital status: Single    Spouse name: N/A  . Number of children: N/A  . Years of education:  N/A   Occupational History  . Not on file.   Social History Main Topics  . Smoking status: Former Smoker    Packs/day: 0.50    Years: 30.00    Quit date: 08/22/2016  . Smokeless tobacco: Never Used  . Alcohol use 0.6 oz/week    1 Glasses of wine per week     Comment: 6 drinks wine or beer a week   . Drug use: No  . Sexual activity: Not on file   Other Topics Concern  . Not on file   Social History Narrative  . 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 Colon cancer Paternal Grandmother 95       d. 917 . Breast cancer Cousin        paternal 165st  cousin, once-removed; dx. 49s  . Other Mother        hx precancerous skin finding removed from eyelid at 70-71  . Heart attack Maternal Grandmother 70  . Stroke Maternal Grandfather   . Heart attack Paternal Grandfather 94  . Other Other 25       hx of precancerous finding on abnormal pap smear  . Cancer Other        maternal great aunt (MGM's sister) d. early 57s; spinal cancer, but this may have been a secondary cancer w/ NOS primary  . Cancer Other        maternal great grandmother (MGM's mother) d. 54s w/ NOS cancer  . Cancer Other        paternal great uncle (PGM's brother) d. 20s w/ NOS cancer    ALLERGIES:  has No Known Allergies.  MEDICATIONS:  Current Outpatient Prescriptions  Medication Sig Dispense Refill  . exemestane (AROMASIN) 25 MG tablet Take 1 tablet (25 mg total) by mouth daily after breakfast. 30 tablet 2  . PARoxetine (PAXIL) 10 MG tablet Take 10 mg by mouth daily.  0  . oxyCODONE-acetaminophen (ROXICET) 5-325 MG tablet Take 1-2 tablets by mouth every 4 (four) hours as needed for severe pain. (Patient not taking: Reported on 05/09/2017) 20 tablet 0   No current facility-administered medications for this visit.     REVIEW OF SYSTEMS:   Constitutional: Denies fevers, chills or abnormal night sweats (+)  fatigue (+) left breast numbness (+) Hot flashes Eyes: Denies blurriness of vision, double vision or watery eyes Ears, nose, mouth, throat, and face: Denies mucositis or sore throat Respiratory: Denies cough, dyspnea or wheezes Cardiovascular: Denies palpitation, chest discomfort or lower extremity swelling Gastrointestinal:  Denies nausea, heartburn or change in bowel habits Skin: (+) redness and slight rash on hads Lymphatics: Denies new lymphadenopathy or easy bruising Neurological:Denies numbness, tingling or new weaknesses Behavioral/Psych: no new changes (+) occasional mood swings All other systems were reviewed with the patient and are negative.  PHYSICAL EXAMINATION: ECOG PERFORMANCE STATUS: 0 - Asymptomatic  Vitals:   05/09/17 0850  BP: 136/66  Pulse: 70  Resp: 17  Temp: 98.2 F (36.8 C)   Filed Weights   05/09/17 0850  Weight: 174 lb 6.4 oz (79.1 kg)    GENERAL:alert, no distress and comfortable SKIN: skin color, texture, turgor are normal, no rashes or significant lesions EYES: normal, conjunctiva are pink and non-injected, sclera clear OROPHARYNX:no exudate, no erythema and lips, buccal mucosa, and tongue normal  NECK: supple, thyroid normal size, non-tender, without nodularity LYMPH:  no palpable lymphadenopathy in the cervical, axillary or inguinal LUNGS: clear to auscultation and percussion with normal breathing effort HEART: regular rate & rhythm and no murmurs and no lower extremity edema ABDOMEN:abdomen soft, non-tender and normal bowel sounds  Musculoskeletal:no cyanosis of digits and no clubbing  PSYCH: alert & oriented x 3 with fluent speech NEURO: no focal motor/sensory deficits Breasts: Diffuse skin erythema of the left breast from radiation, no ulcers or discharge. Small scar behind the older incision in the left breast UOQ. No other palpable mass or adenpathy  LABORATORY DATA:  I have reviewed the data as listed CBC Latest Ref Rng & Units 05/09/2017  04/05/2017 02/17/2017  WBC 3.9 - 10.3 10e3/uL 9.8 10.8(H) 10.9(H)  Hemoglobin 11.6 - 15.9 g/dL 13.9 13.8 14.0  Hematocrit 34.8 - 46.6 % 43.1 41.8 43.1  Platelets 145 - 400 10e3/uL 432(H) 473(H) 409(H)   CMP Latest Ref Rng & Units  02/17/2017 12/13/2016 10/01/2016  Glucose 70 - 140 mg/dl 92 81 92  BUN 7.0 - 26.0 mg/dL 13.1 11.9 14  Creatinine 0.6 - 1.1 mg/dL 0.8 0.8 0.93  Sodium 136 - 145 mEq/L 138 141 139  Potassium 3.5 - 5.1 mEq/L 4.6 4.1 4.2  Chloride 101 - 111 mmol/L - - 106  CO2 22 - 29 mEq/L _0 Calcium 8.4 - 10.4 mg/dL 9.5 9.6 9.2  Total Protein 6.4 - 8.3 g/dL 7.3 6.9 -  Total Bilirubin 0.20 - 1.20 mg/dL 0.24 0.40 -  Alkaline Phos 40 - 150 U/L 135 121 -  AST 5 - 34 U/L 14 18 -  ALT 0 - 55 U/L 21 30 -     PATHOLOGY REPORT  Diagnosis 04/14/17 Ovaries and fallopian tubes, bilateral - BENIGN OVARIES WITH INCLUSION CYST. - BENIGN FALLOPIAN TUBES WITH PARATUBAL CYSTS.  Diagnosis 09/06/2016 1. Breast, left, needle core biopsy, 3 o'clock - INVASIVE DUCTAL CARCINOMA. - DUCTAL CARCINOMA IN SITU.  - SEE COMMENT. 2. Lymph node, axilla and upper limb, left - METASTATIC CARCINOMA IN 1 OF 1 LYMPH NODE (1/1), WITH EXTRACAPSULAR EXTENSION. Microscopic Comment 1. The carcinoma appears grade 2. A breast prognostic profile will be performed and the results reported separately. The results were called to [facility] on 09/07/16. (JBK:gt, 09/07/16) 1. PROGNOSTIC INDICATORS Results: IMMUNOHISTOCHEMICAL AND MORPHOMETRIC ANALYSIS PERFORMED MANUALLY Estrogen Receptor: 100%, POSITIVE, STRONG STAINING INTENSITY Progesterone Receptor: 100%, POSITIVE, STRONG STAINING INTENSITY Proliferation Marker Ki67: 5%  Results: HER2 - NEGATIVE RATIO OF HER2/CEP17 SIGNALS 1.46 AVERAGE HER2 COPY NUMBER PER CELL 3.00  Diagnosis 10/05/2016 1. Breast, lumpectomy, Left - INVASIVE AND IN SITU DUCTAL CARCINOMA, 1 CM. - MARGINS NOT INVOLVED. - FIBROCYSTIC CHANGES. 2. Lymph node,  sentinel, biopsy, Left #1 - METASTATIC CARCINOMA IN ONE LYMPH NODE WITH EXTRACAPSULAR EXTENSION (1/1). 3. Lymph node, sentinel, biopsy, Left #2 - METASTATIC CARCINOMA IN ONE LYMPH NODE WITH EXTRACAPSULAR EXTENSION (1/1). Microscopic Comment 1. BREAST, INVASIVE TUMOR, WITH LYMPH NODES PRESENT Specimen, including laterality and lymph node sampling (sentinel, non-sentinel): Left breast with two sentinel lymph nodes Procedure: Localized lumpectomy and sentinel lymph node biopsies. Histologic type: Ductal Grade: 1 Tubule formation: 1 Nuclear pleomorphism: 2 Mitotic: 1 Tumor size (gross measurement) 1 cm Margins: Free of tumor Invasive, distance to closest margin: 0.3 cm from anterior margin In-situ, distance to closest margin: 0.2 cm from anterior margin If margin positive, focally or broadly: N/A Lymphovascular invasion: Present Ductal carcinoma in situ: Present Grade: Low grade Extensive intraductal component: No Lobular neoplasia: No Tumor focality: Unifocal Extent of tumor: 1 of 3 FINAL for NAIYA, Gloria Smith ([id number]) Microscopic Comment(continued) Skin: N/A Nipple: N/A Skeletal muscle: N/A Lymph nodes: Examined: 2 Sentinel 0 Non-sentinel 2 Total Lymph nodes with metastasis: 2 Isolated tumor cells (< 0.2 mm): 0 Micrometastasis: (> 0.2 mm and < 2.0 mm): 0 Macrometastasis: (> 2.0 mm): 2 Extracapsular extension: Present, both nodes Breast prognostic profile: Case [id number] Estrogen receptor: 100% and 95%, positive Progesterone receptor: 100% and 100%, positive Her 2 neu: Negative, ratio is 1.46 and 1.45 Ki-67: 5% and 10% Non-neoplastic breast: Fibrocystic changes TNM: pT1b, pN1a, pMX (JDP:kh 10-06-16) Claudette Laws MD Pathologist, Electronic Signature (Case signed 10/06/2016)   Mammaprint 09/06/2016      RADIOGRAPHIC STUDIES: I have personally reviewed the radiological images as listed and agreed with the findings in the report. No results found.  I  have reviewed her outside mammogram and ultrasound results.  ASSESSMENT & PLAN:  46 y.o. pre-or perimenopausal  woman, presented with screening discovered left breast mass and axilla adenopathy.  1. Breast cancer of upper-outer quadrant of left female breast, pT1bN1aM0 stage IIA, G1, ER+/PR+/HER2-, Ki67 5% -I previously discussed her surgical pathology results with the patient in details. -She has had a complete surgical resections, margins were negative, she had 2 positive sentinel lymph nodes with extracapsular extension, (+) LVI which are risk for recurrence.  -We did mammaprint on her initial biopsy tumor tissue, which showed low risk disease, luminal type A. Her risk of recurrence in 10 years without adjuvant therapy is 10%. Her 5 year distant metastasis free survival is 97.8% with adjuvant antiestrogen therapy. Based on the mammaprint results, I do not recommend adjuvant chemotherapy. -She has started exemestane with ovarian suppression, now status post BSO. She is tolerating exemestane well, we'll continue for 7 years -She is clinically doing well, lab results reviewed, exam was unremarkable, no clinical concern for recurrence. -Continue breast cancer surveillance. Mammogram and Bone density Scan for it to be done at [facility] 08/2017 -Labs reviewed and adequate to continue exemestane.  -I encouraged her to have healthy diet and exercise regulary.    2. Genetics  -Giving her young age and positive family history of breast cancer, she'll be referred to genetic counselor for genetic testing, to ruled out inheritable breast cancer syndrome. -Negative for known pathogenic mutations within any of 20 genes on the Breast/Ovarian Cancer Panel through [employer].  One variant of uncertain significance (VUS) called "c.3571A>G (p.Ile1191Val)" was found in one copy of the BRIP1 gene.  The Breast/Ovarian Cancer Panel offered by [facility] Hope Pigeon, MD) includes sequencing and  deletion/duplication analysis for the following 19 genes:  ATM, BARD1, BRCA1, BRCA2, BRIP1, CDH1, CHEK2, FANCC, MLH1, MSH2, MSH6, NBN, PALB2, PMS2, PTEN, RAD51C, RAD51D, TP53, and XRCC2.  This panel also includes deletion/duplication analysis (without sequencing) for one gene, EPCAM.   3. Hot flush - secondary to BSO and exemestane -She is on Paxil now -Continue monitoring  4. Bone Health -We'll get a bone density scan later this year as a baseline. -We discussed menopause and exemestane weaks her bone  PLAN -lab and f/u in  4 months  -Mammogram and DEXA in 08/2017   All questions were answered. The patient knows to call the clinic with any problems, questions or concerns. I spent 25 minutes counseling the patient face to face. The total time spent in the appointment was 30 minutes and more than 50% was on counseling.     Truitt Merle, MD 05/09/2017   This document serves as a record of services personally performed by Truitt Merle, MD. It was created on her behalf by Joslyn Devon, a trained medical scribe. The creation of this record is based on the scribe's personal observations and the provider's statements to them. This document has been checked and approved by the attending provider.

## 2017-05-05 ENCOUNTER — Encounter (HOSPITAL_COMMUNITY): Payer: Self-pay

## 2017-05-06 ENCOUNTER — Encounter (HOSPITAL_COMMUNITY): Payer: Self-pay

## 2017-05-09 ENCOUNTER — Encounter: Payer: Self-pay | Admitting: Hematology

## 2017-05-09 ENCOUNTER — Other Ambulatory Visit (HOSPITAL_BASED_OUTPATIENT_CLINIC_OR_DEPARTMENT_OTHER): Payer: Managed Care, Other (non HMO)

## 2017-05-09 ENCOUNTER — Ambulatory Visit: Payer: Managed Care, Other (non HMO)

## 2017-05-09 ENCOUNTER — Ambulatory Visit (HOSPITAL_BASED_OUTPATIENT_CLINIC_OR_DEPARTMENT_OTHER): Payer: Managed Care, Other (non HMO) | Admitting: Hematology

## 2017-05-09 ENCOUNTER — Telehealth: Payer: Self-pay | Admitting: Hematology

## 2017-05-09 VITALS — BP 136/66 | HR 70 | Temp 98.2°F | Resp 17 | Ht 64.0 in | Wt 174.4 lb

## 2017-05-09 DIAGNOSIS — Z170 Estrogen receptor positive status [ER+]: Secondary | ICD-10-CM | POA: Diagnosis not present

## 2017-05-09 DIAGNOSIS — Z79811 Long term (current) use of aromatase inhibitors: Secondary | ICD-10-CM | POA: Diagnosis not present

## 2017-05-09 DIAGNOSIS — C50412 Malignant neoplasm of upper-outer quadrant of left female breast: Secondary | ICD-10-CM

## 2017-05-09 DIAGNOSIS — R232 Flushing: Secondary | ICD-10-CM

## 2017-05-09 DIAGNOSIS — C773 Secondary and unspecified malignant neoplasm of axilla and upper limb lymph nodes: Secondary | ICD-10-CM | POA: Diagnosis not present

## 2017-05-09 LAB — COMPREHENSIVE METABOLIC PANEL
ALBUMIN: 3.6 g/dL (ref 3.5–5.0)
ALK PHOS: 139 U/L (ref 40–150)
ALT: 27 U/L (ref 0–55)
AST: 15 U/L (ref 5–34)
Anion Gap: 8 mEq/L (ref 3–11)
BILIRUBIN TOTAL: 0.39 mg/dL (ref 0.20–1.20)
BUN: 16.2 mg/dL (ref 7.0–26.0)
CO2: 26 mEq/L (ref 22–29)
Calcium: 9.8 mg/dL (ref 8.4–10.4)
Chloride: 106 mEq/L (ref 98–109)
Creatinine: 0.8 mg/dL (ref 0.6–1.1)
EGFR: 84 mL/min/{1.73_m2} — ABNORMAL LOW (ref >90)
GLUCOSE: 115 mg/dL (ref 70–140)
Potassium: 4.6 mEq/L (ref 3.5–5.1)
SODIUM: 140 meq/L (ref 136–145)
TOTAL PROTEIN: 7.1 g/dL (ref 6.4–8.3)

## 2017-05-09 LAB — CBC WITH DIFFERENTIAL/PLATELET
BASO%: 0.6 % (ref 0.0–2.0)
Basophils Absolute: 0.1 10*3/uL (ref 0.0–0.1)
EOS%: 1.6 % (ref 0.0–7.0)
Eosinophils Absolute: 0.2 10*3/uL (ref 0.0–0.5)
HCT: 43.1 % (ref 34.8–46.6)
HEMOGLOBIN: 13.9 g/dL (ref 11.6–15.9)
LYMPH%: 16.4 % (ref 14.0–49.7)
MCH: 28.7 pg (ref 25.1–34.0)
MCHC: 32.3 g/dL (ref 31.5–36.0)
MCV: 88.9 fL (ref 79.5–101.0)
MONO#: 0.6 10*3/uL (ref 0.1–0.9)
MONO%: 6.5 % (ref 0.0–14.0)
NEUT#: 7.4 10*3/uL — ABNORMAL HIGH (ref 1.5–6.5)
NEUT%: 74.9 % (ref 38.4–76.8)
Platelets: 432 10*3/uL — ABNORMAL HIGH (ref 145–400)
RBC: 4.85 10*6/uL (ref 3.70–5.45)
RDW: 13.4 % (ref 11.2–14.5)
WBC: 9.8 10*3/uL (ref 3.9–10.3)
lymph#: 1.6 10*3/uL (ref 0.9–3.3)

## 2017-05-09 NOTE — Telephone Encounter (Signed)
Appointments scheduled per 05/09/17 los. °Patient was given a copy of the AVS report and appointment schedule, per 05/09/17 los. °

## 2017-05-13 ENCOUNTER — Encounter (HOSPITAL_COMMUNITY): Payer: Self-pay

## 2017-05-23 ENCOUNTER — Telehealth: Payer: Self-pay | Admitting: Hematology

## 2017-05-23 NOTE — Telephone Encounter (Signed)
Faxed records to dr dewey [phone]

## 2017-07-05 ENCOUNTER — Telehealth: Payer: Self-pay | Admitting: Hematology

## 2017-07-05 NOTE — Telephone Encounter (Signed)
Faxed records to Dr. Ernie Hew

## 2017-07-08 ENCOUNTER — Other Ambulatory Visit: Payer: Self-pay | Admitting: Hematology

## 2017-07-19 ENCOUNTER — Encounter: Payer: Self-pay | Admitting: Family Medicine

## 2017-08-23 ENCOUNTER — Telehealth: Payer: Self-pay

## 2017-08-23 NOTE — Telephone Encounter (Signed)
Spoke with patient and she is aware of her new appt date and time  Gloria Smith 

## 2017-09-08 ENCOUNTER — Other Ambulatory Visit: Payer: Managed Care, Other (non HMO)

## 2017-09-08 ENCOUNTER — Ambulatory Visit: Payer: Managed Care, Other (non HMO) | Admitting: Hematology

## 2017-09-14 ENCOUNTER — Telehealth: Payer: Self-pay

## 2017-09-14 NOTE — Telephone Encounter (Signed)
Called per Dr. Burr Medico and given results of normal bone density. Verbalized understanding.

## 2017-09-21 NOTE — Progress Notes (Signed)
[place]  [phone] Fax:([phone]  Clinic Follow Up Note   Patient Care Team: Aloha Gell, MD as PCP - General (Obstetrics and Gynecology) Kyung Rudd, MD as Consulting Physician (Radiation Oncology) Truitt Merle, MD as Consulting Physician (Hematology) Rolm Bookbinder, MD as Consulting Physician (General Surgery) Gardenia Phlegm, NP as Nurse Practitioner (Hematology and Oncology) 09/22/2017  CHIEF COMPLAINTS:  Follow up left breast cancer  Oncology History   Breast cancer of upper-outer quadrant of left female breast [facility])   Staging form: Breast, AJCC 7th Edition   - Clinical stage from 09/06/2016: Stage IIA (T1b, N1, M0) - Signed by Truitt Merle, MD on 09/15/2016   - Pathologic stage from 10/05/2016: Stage IIA (T1b, N1a, cM0) - Signed by Truitt Merle, MD on 10/24/2016      Breast cancer of upper-outer quadrant of left female breast ([place])   09/06/2016 Initial Diagnosis    Malignant neoplasm of upper-outer quadrant of right female breast ([place])      09/06/2016 Initial Biopsy    Left breast 3:00 position showed invasive ductal carcinoma and DCIS, left axillary lymph node biopsy showed metastatic carcinoma with extracapsular extension      09/06/2016 Receptors her2    Breast tumor ER 100% positive, PR 100% positive, HER-2 negative, Ki-67 5%      09/06/2016 Mammogram    Diagnostic mammogram and ultrasound showed a 1.0 cm mass at the 3 clock position of left breast, and the enlarged left axillary node with cortical thickening. The 43m cm oval cyst in the right breast is likely benign.      09/06/2016 Miscellaneous    Mammaprint showed low risk luminal A type, with average 10 year risk of recurrence untreated 10%.      09/16/2016 Genetic Testing    Genetic testing was normal, and did not reveal a deleterious mutation  One variant of uncertain significance (VUS) was found in the BRIP1 gene.  Genes tested include: ATM, BARD1, BRCA1,  BRCA2, BRIP1, CDH1, CHEK2, FANCC, MLH1, MSH2, MSH6, NBN, PALB2, PMS2, PTEN, RAD51C, RAD51D, TP53, and XRCC2.  This panel also includes deletion/duplication analysis (without sequencing) for one gene, EPCAM.        10/05/2016 Surgery    Left breast lumpectomy and SLN biopsy       10/05/2016 Pathology Results    Left breast lumpectomy showed invasive and in situ ductal carcinoma, 1 cm, grade 1, margins are negative, 2 sentinel lymph nodes are positive for metastatic carcinoma with extracapsular extension. LVI (+)      11/08/2016 - 12/24/2016 Radiation Therapy    Adjuvant radiation (Dr. MTobe Sos The Left breast was treated to 50.4 Gy in 28 fractions of 1.8 Gy per fraction. 2. The Left axilla was treated to 50.4 Gy in 28 fractions of 1.8 Gy per fraction.  3. The Left breast was boosted to 10 Gy in 5 fractions of 2 Gy per fraction.        01/10/2017 -  Anti-estrogen oral therapy    Exemestane 213mdaily      04/14/2017 Surgery    LAPAROSCOPIC BILATERAL SALPINGO OOPHORECTOMY by Dr. FoPamala Hurry     HISTORY OF PRESENTING ILLNESS:  Gloria STOBAUGH89.o. female is here because of her recently diagnosed left breast cancer. She presents to our multidisciplinary breast clinic with her husband.    She had screening mammogram on 09/01/2016 at her gynecologist office, which showed a bilateral breast nodules. Her previous mammogram was 4 years ago which was  normal. She denies any significant symptoms, except occasional mild left breast shooting pain for the past few months, no palpable mass, skin change or nipple discharge. She feels well overall. She gained about 20lbs in the past 6 months   She was referred to [facility] for diagnostic mammogram and ultrasound, which showed a benign 49m cyst in the right breast, and a 1 cm mass in the 3:00 position of left breast, and I enlarged left axillary node. Both the left breast mass and axillary node biopsy showed invasive ductal carcinoma, ER/PR strongly positive,  HER-2 negative.  GYN HISTORY  Menarchal: 12 LMP: spotting 08/2016, she has IUD (Gloria Smith Contraceptive:  HRT: n/a G4P1: she has 151yo son   CURRENT THERAPY: Exemestane 25 mg Daily started 01/10/17   INTERIM HISTORY:  SSARAHGRACE BROMANreturns for follow up alone. Things have been going well for her in the interim. She continues to take Exemestane and she has been tolerating this well. She does still experience hot flashes, but these have been manageable. Pt notes that she drinks a glass of wine occasionally and after this she will experience a hot flash. She also reports some shooting pains around the site other incision and some mild joint pain into her fingers, but both of these have been mild and manageable as well. She did have her screening mammography and bone scan with [facility] last month which she states were normal, we will request these records.   MEDICAL HISTORY:  Past Medical History:  Diagnosis Date  . Arthritis   . Headache    ocular migraines - very rare  . [place]ignant [place]   pt states that cancer is on left breast, not right  . Urinary frequency     SURGICAL HISTORY: Past Surgical History:  Procedure Laterality Date  . BREAST LUMPECTOMY WITH RADIOACTIVE SEED AND SENTINEL LYMPH NODE BIOPSY Left 10/05/2016   Procedure: LEFT BREAST RADIOACTIVE SEED GUIDED LUMPECTOMY, LEFT AXILLARY RADIOACTIVE SEED GUIDED NODE EXCISION, LEFT AXILLARY SENTINEL LYMPH NODE BIOPSY(LEFT TARGETED AXILLARY DISSECTION);  Surgeon: MRolm Bookbinder MD;  [place] [facility]  Service: General;  Laterality: Left;  . DENTAL SURGERY    . DILATION AND CURETTAGE OF UTERUS    . LAPAROSCOPIC BILATERAL SALPINGO OOPHERECTOMY Bilateral 04/14/2017   Procedure: LAPAROSCOPIC BILATERAL SALPINGO OOPHORECTOMY;  Surgeon: FAloha Gell MD;  [facility];  Service: Gynecology;  Laterality: Bilateral;  . right knee surgery at age of 46      SOCIAL  HISTORY: Social History   Social History  . Marital status: Single    Spouse name: N/A  . Number of children: N/A  . Years of education: N/A   Occupational History  . Not on file.   Social History Main Topics  . Smoking status: Former Smoker    Packs/day: 0.50    Years: 30.00    Quit date: 08/22/2016  . Smokeless tobacco: Never Used  . Alcohol use 0.6 oz/week    1 Glasses of wine per week     Comment: 6 drinks wine or beer a week   . Drug use: No  . Sexual activity: Not on file   Other Topics Concern  . Not on file   Social History Narrative  . 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 Colon cancer Paternal Grandmother 95  d. 68  . Breast cancer Cousin        paternal 1st cousin, once-removed; dx. 46s  . Other Mother        hx precancerous skin finding removed from eyelid at 70-71  . Heart attack Maternal Grandmother 70  . Stroke Maternal Grandfather   . Heart attack Paternal Grandfather 15  . Other Other 25       hx of precancerous finding on abnormal pap smear  . Cancer Other        maternal great aunt (MGM's sister) d. early 56s; spinal cancer, but this may have been a secondary cancer w/ NOS primary  . Cancer Other        maternal great grandmother (MGM's mother) d. 39s w/ NOS cancer  . Cancer Other        paternal great uncle (PGM's brother) d. 80s w/ NOS cancer    ALLERGIES:  has No Known Allergies.  MEDICATIONS:  Current Outpatient Prescriptions  Medication Sig Dispense Refill  . exemestane (AROMASIN) 25 MG tablet take 1 tablet by mouth once daily AFTER BREAKFAST 90 tablet 2  . PARoxetine (PAXIL) 10 MG tablet Take 10 mg by mouth daily.  0  . oxyCODONE-acetaminophen (ROXICET) 5-325 MG tablet Take 1-2 tablets by mouth every 4 (four) hours as needed for severe pain. (Patient not taking: Reported on 05/09/2017) 20 tablet 0   No current  facility-administered medications for this visit.     REVIEW OF SYSTEMS:   Constitutional: Denies fevers, chills or abnormal night sweats (+) Hot flashes Eyes: Denies blurriness of vision, double vision or watery eyes Ears, nose, mouth, throat, and face: Denies mucositis or sore throat Respiratory: Denies cough, dyspnea or wheezes Cardiovascular: Denies palpitation, chest discomfort or lower extremity swelling. Gastrointestinal:  Denies nausea, heartburn or change in bowel habits Skin: Denies rashes.  MSK: (+) pain surrounding incisional site on left chest wall. (+) arthralgic pain into the fingers Lymphatics: Denies new lymphadenopathy or easy bruising Neurological:Denies numbness, tingling or new weaknesses Behavioral/Psych: no new changes.  All other systems were reviewed with the patient and are negative.  PHYSICAL EXAMINATION: ECOG PERFORMANCE STATUS: 0 - Asymptomatic  Vitals:   09/22/17 0821  BP: (!) 108/53  Pulse: 84  Resp: 18  Temp: 98.1 F (36.7 C)  SpO2: 97%   Filed Weights   09/22/17 0821  Weight: 179 lb 1.6 oz (81.2 kg)    GENERAL:alert, no distress and comfortable SKIN: skin color, texture, turgor are normal, no rashes or significant lesions EYES: normal, conjunctiva are pink and non-injected, sclera clear OROPHARYNX:no exudate, no erythema and lips, buccal mucosa, and tongue normal  NECK: supple, thyroid normal size, non-tender, without nodularity LYMPH:  no palpable lymphadenopathy in the cervical, axillary or inguinal LUNGS: clear to auscultation and percussion with normal breathing effort HEART: regular rate & rhythm and no murmurs and no lower extremity edema ABDOMEN:abdomen soft, normal bowel sounds. She has some tenderness in the RUQ of the abdomen. No palpable organomegaly.   Musculoskeletal:no cyanosis of digits and no clubbing  PSYCH: alert & oriented x 3 with fluent speech NEURO: no focal motor/sensory deficits. Breasts: Breast inspection showed  them to be symmetrical with no nipple discharge. Palpation of the breasts and axilla revealed no obvious mass that I could appreciate. Small scar behind the older incision in the left breast UOQ.      LABORATORY DATA:  I have reviewed the data as listed CBC Latest Ref Rng & Units 09/22/2017 05/09/2017 04/05/2017  WBC 3.9 - 10.3 10e3/uL 9.6 9.8 10.8(H)  Hemoglobin 11.6 - 15.9 g/dL 14.2 13.9 13.8  Hematocrit 34.8 - 46.6 % 42.2 43.1 41.8  Platelets 145 - 400 10e3/uL 459(H) 432(H) 473(H)   CMP Latest Ref Rng & Units 09/22/2017 05/09/2017 02/17/2017  Glucose 70 - 140 mg/dl 124 115 92  BUN 7.0 - 26.0 mg/dL 16.7 16.2 13.1  Creatinine 0.6 - 1.1 mg/dL 0.9 0.8 0.8  Sodium 136 - 145 mEq/L 141 140 138  Potassium 3.5 - 5.1 mEq/L 4.0 4.6 4.6  Chloride 101 - 111 mmol/L - - -  CO2 22 - 29 mEq/L _0 Calcium 8.4 - 10.4 mg/dL 9.7 9.8 9.5  Total Protein 6.4 - 8.3 g/dL 6.9 7.1 7.3  Total Bilirubin 0.20 - 1.20 mg/dL 0.54 0.39 0.24  Alkaline Phos 40 - 150 U/L 175(H) 139 135  AST 5 - 34 U/L _1 ALT 0 - 55 U/L _2 PATHOLOGY REPORT  Diagnosis 04/14/17 Ovaries and fallopian tubes, bilateral - BENIGN OVARIES WITH INCLUSION CYST. - BENIGN FALLOPIAN TUBES WITH PARATUBAL CYSTS.  Diagnosis 09/06/2016 1. Breast, left, needle core biopsy, 3 o'clock - INVASIVE DUCTAL CARCINOMA. - DUCTAL CARCINOMA IN SITU.  - SEE COMMENT. 2. Lymph node, axilla and upper limb, left - METASTATIC CARCINOMA IN 1 OF 1 LYMPH NODE (1/1), WITH EXTRACAPSULAR EXTENSION. Microscopic Comment 1. The carcinoma appears grade 2. A breast prognostic profile will be performed and the results reported separately. The results were called to [facility] on 09/07/16. (JBK:gt, 09/07/16) 1. PROGNOSTIC INDICATORS Results: IMMUNOHISTOCHEMICAL AND MORPHOMETRIC ANALYSIS PERFORMED MANUALLY Estrogen Receptor: 100%, POSITIVE, STRONG STAINING INTENSITY Progesterone Receptor: 100%, POSITIVE, STRONG STAINING  INTENSITY Proliferation Marker Ki67: 5%  Results: HER2 - NEGATIVE RATIO OF HER2/CEP17 SIGNALS 1.46 AVERAGE HER2 COPY NUMBER PER CELL 3.00  Diagnosis 10/05/2016 1. Breast, lumpectomy, Left - INVASIVE AND IN SITU DUCTAL CARCINOMA, 1 CM. - MARGINS NOT INVOLVED. - FIBROCYSTIC CHANGES. 2. Lymph node, sentinel, biopsy, Left #1 - METASTATIC CARCINOMA IN ONE LYMPH NODE WITH EXTRACAPSULAR EXTENSION (1/1). 3. Lymph node, sentinel, biopsy, Left #2 - METASTATIC CARCINOMA IN ONE LYMPH NODE WITH EXTRACAPSULAR EXTENSION (1/1). Microscopic Comment 1. BREAST, INVASIVE TUMOR, WITH LYMPH NODES PRESENT Specimen, including laterality and lymph node sampling (sentinel, non-sentinel): Left breast with two sentinel lymph nodes Procedure: Localized lumpectomy and sentinel lymph node biopsies. Histologic type: Ductal Grade: 1 Tubule formation: 1 Nuclear pleomorphism: 2 Mitotic: 1 Tumor size (gross measurement) 1 cm Margins: Free of tumor Invasive, distance to closest margin: 0.3 cm from anterior margin In-situ, distance to closest margin: 0.2 cm from anterior margin If margin positive, focally or broadly: N/A Lymphovascular invasion: Present Ductal carcinoma in situ: Present Grade: Low grade Extensive intraductal component: No Lobular neoplasia: No Tumor focality: Unifocal Extent of tumor: 1 of 3 FINAL for Gloria Smith, Gloria Smith ([id number]) Microscopic Comment(continued) Skin: N/A Nipple: N/A Skeletal muscle: N/A Lymph nodes: Examined: 2 Sentinel 0 Non-sentinel 2 Total Lymph nodes with metastasis: 2 Isolated tumor cells (< 0.2 mm): 0 Micrometastasis: (> 0.2 mm and < 2.0 mm): 0 Macrometastasis: (> 2.0 mm): 2 Extracapsular extension: Present, both nodes Breast prognostic profile: Case [id number] Estrogen receptor: 100% and 95%, positive Progesterone receptor: 100% and 100%, positive Her 2 neu: Negative, ratio is 1.46 and 1.45 Ki-67: 5% and 10% Non-neoplastic breast: Fibrocystic  changes TNM: pT1b, pN1a, pMX (JDP:kh 10-06-16) Claudette Laws MD Pathologist, Electronic Signature (Case signed 10/06/2016)   Mammaprint 09/06/2016  RADIOGRAPHIC STUDIES: I have personally reviewed the radiological images as listed and agreed with the findings in the report. No results found.  I have reviewed her outside mammogram and ultrasound results.  ASSESSMENT & PLAN:  46 y.o. pre-or perimenopausal woman, presented with screening discovered left breast mass and axilla adenopathy.  1. Breast cancer of upper-outer quadrant of left female breast, pT1bN1aM0 stage IIA, G1, ER+/PR+/HER2-, Ki67 5% -I previously discussed her surgical pathology results with the patient in details. -She has had a complete surgical resections, margins were negative, she had 2 positive sentinel lymph nodes with extracapsular extension, (+) LVI which are risk for recurrence.  -We did mammaprint on her initial biopsy tumor tissue, which showed low risk disease, luminal type A. Her risk of recurrence in 10 years without adjuvant therapy is 10%. Her 5 year distant metastasis free survival is 97.8% with adjuvant antiestrogen therapy. Based on the mammaprint results, I do not recommend adjuvant chemotherapy. -She has started exemestane with ovarian suppression, now status post BSO. She is tolerating exemestane well, we'll continue for 7 years -She is clinically doing well, lab results reviewed, exam was unremarkable, no clinical concern for recurrence. -Continue breast cancer surveillance. Mammogram and Bone density Scan was [facility] 08/2017; we will request these records to be sent over.  -Labs reviewed, normal CBC and CMP except elevated alkaline phosphatase -I encouraged her to have healthy diet and exercise regulary.  -Slightly elevated Alkaline Phosphatase level was noticed today at 175, other LFTs are normal. We will monitor this closely with repeat labs next month. Other labs overall are stable.   -Due to the tenderness in the right upper quadrant, and elevated alkaline phosphatase, I will get an ultrasound next week to evaluate her liver.  2. Genetics  -Giving her young age and positive family history of breast cancer, she'll be referred to genetic counselor for genetic testing, to ruled out inheritable breast cancer syndrome. -Negative for known pathogenic mutations within any of 20 genes on the Breast/Ovarian Cancer Panel through [facility] [employer] [facility].  One variant of uncertain significance (VUS) called "[id number].[id number] (p.Ile1191Val)" was found in one copy of the BRIP1 gene.  The Breast/Ovarian Cancer Panel offered by [facility] Hope Pigeon, MD) includes sequencing and deletion/duplication analysis for the following 19 genes:  ATM, BARD1, BRCA1, BRCA2, BRIP1, CDH1, CHEK2, FANCC, MLH1, MSH2, MSH6, NBN, PALB2, PMS2, PTEN, RAD51C, RAD51D, TP53, and XRCC2.  This panel also includes deletion/duplication analysis (without sequencing) for one gene, EPCAM.   3. Hot flash - secondary to BSO and exemestane -She is on Paxil now -Continue monitoring  4. Bone Health -DEXA was performed with [facility] last month, we will request that these records are sent to our office so that I can review them.  -We discussed menopause and exemestane weaks her bone.   5. RUQ tenderness  -I did examine her abdomen today and she did experience some tenderness with palpation to the RUQ. I will obtain and US of the area next week to evaluate this further.   PLAN -continue exemestane  -Lab in one month -ABD [place] next week to evaluate liver, call me after the ultrasound to review the results -Lab and f/u in 2 moths   All questions were answered. The patient knows to call the clinic with any problems, questions or concerns. I spent 25 minutes counseling the patient face to face. The total time spent in the appointment was 30 minutes and more than 50% was on counseling.    Truitt Merle, MD 09/22/2017  This document  serves as a record of services personally performed by Truitt Merle, MD. It was created on her behalf by Reola Mosher, a trained medical scribe. The creation of this record is based on the scribe's personal observations and the provider's statements to them. This document has been checked and approved by the attending provider.

## 2017-09-22 ENCOUNTER — Ambulatory Visit (HOSPITAL_BASED_OUTPATIENT_CLINIC_OR_DEPARTMENT_OTHER): Payer: Managed Care, Other (non HMO) | Admitting: Hematology

## 2017-09-22 ENCOUNTER — Other Ambulatory Visit (HOSPITAL_BASED_OUTPATIENT_CLINIC_OR_DEPARTMENT_OTHER): Payer: Managed Care, Other (non HMO)

## 2017-09-22 ENCOUNTER — Encounter: Payer: Self-pay | Admitting: Hematology

## 2017-09-22 ENCOUNTER — Telehealth: Payer: Self-pay | Admitting: Hematology

## 2017-09-22 VITALS — BP 108/53 | HR 84 | Temp 98.1°F | Resp 18 | Ht 64.0 in | Wt 179.1 lb

## 2017-09-22 DIAGNOSIS — C50412 Malignant neoplasm of upper-outer quadrant of left female breast: Secondary | ICD-10-CM | POA: Diagnosis not present

## 2017-09-22 DIAGNOSIS — N951 Menopausal and female climacteric states: Secondary | ICD-10-CM | POA: Diagnosis not present

## 2017-09-22 DIAGNOSIS — Z170 Estrogen receptor positive status [ER+]: Secondary | ICD-10-CM

## 2017-09-22 DIAGNOSIS — R10811 Right upper quadrant abdominal tenderness: Secondary | ICD-10-CM

## 2017-09-22 LAB — COMPREHENSIVE METABOLIC PANEL
ALBUMIN: 3.6 g/dL (ref 3.5–5.0)
ALT: 25 U/L (ref 0–55)
AST: 13 U/L (ref 5–34)
Alkaline Phosphatase: 175 U/L — ABNORMAL HIGH (ref 40–150)
Anion Gap: 8 mEq/L (ref 3–11)
BILIRUBIN TOTAL: 0.54 mg/dL (ref 0.20–1.20)
BUN: 16.7 mg/dL (ref 7.0–26.0)
CO2: 27 meq/L (ref 22–29)
Calcium: 9.7 mg/dL (ref 8.4–10.4)
Chloride: 106 mEq/L (ref 98–109)
Creatinine: 0.9 mg/dL (ref 0.6–1.1)
GLUCOSE: 124 mg/dL (ref 70–140)
POTASSIUM: 4 meq/L (ref 3.5–5.1)
Sodium: 141 mEq/L (ref 136–145)
TOTAL PROTEIN: 6.9 g/dL (ref 6.4–8.3)

## 2017-09-22 LAB — CBC WITH DIFFERENTIAL/PLATELET
BASO%: 1.2 % (ref 0.0–2.0)
Basophils Absolute: 0.1 10*3/uL (ref 0.0–0.1)
EOS ABS: 0.2 10*3/uL (ref 0.0–0.5)
EOS%: 2 % (ref 0.0–7.0)
HCT: 42.2 % (ref 34.8–46.6)
HEMOGLOBIN: 14.2 g/dL (ref 11.6–15.9)
LYMPH%: 18.5 % (ref 14.0–49.7)
MCH: 29.2 pg (ref 25.1–34.0)
MCHC: 33.6 g/dL (ref 31.5–36.0)
MCV: 86.7 fL (ref 79.5–101.0)
MONO#: 0.7 10*3/uL (ref 0.1–0.9)
MONO%: 6.9 % (ref 0.0–14.0)
NEUT%: 71.4 % (ref 38.4–76.8)
NEUTROS ABS: 6.8 10*3/uL — AB (ref 1.5–6.5)
Platelets: 459 10*3/uL — ABNORMAL HIGH (ref 145–400)
RBC: 4.87 10*6/uL (ref 3.70–5.45)
RDW: 13.4 % (ref 11.2–14.5)
WBC: 9.6 10*3/uL (ref 3.9–10.3)
lymph#: 1.8 10*3/uL (ref 0.9–3.3)

## 2017-09-22 MED ORDER — EXEMESTANE 25 MG PO TABS: ORAL_TABLET | ORAL | 2 refills | Status: DC

## 2017-09-22 NOTE — Telephone Encounter (Signed)
Scheduled appt per 11/1 los - Gave patient AVS and calender per los ./ [facility] to contact patient with [place] .

## 2017-09-27 ENCOUNTER — Ambulatory Visit (HOSPITAL_COMMUNITY)
Admission: RE | Admit: 2017-09-27 | Discharge: 2017-09-27 | Disposition: A | Discharge status: Home / Self Care | Payer: Managed Care, Other (non HMO) | Source: Ambulatory Visit | Attending: Hematology | Admitting: Hematology

## 2017-09-27 DIAGNOSIS — Z170 Estrogen receptor positive status [ER+]: Secondary | ICD-10-CM | POA: Insufficient documentation

## 2017-09-27 DIAGNOSIS — C50412 Malignant neoplasm of upper-outer quadrant of left female breast: Secondary | ICD-10-CM | POA: Insufficient documentation

## 2017-09-28 ENCOUNTER — Telehealth: Payer: Self-pay | Admitting: Hematology

## 2017-09-28 ENCOUNTER — Other Ambulatory Visit: Payer: Self-pay | Admitting: Hematology

## 2017-09-28 DIAGNOSIS — K769 Liver disease, unspecified: Secondary | ICD-10-CM

## 2017-09-28 DIAGNOSIS — C50412 Malignant neoplasm of upper-outer quadrant of left female breast: Secondary | ICD-10-CM

## 2017-09-28 DIAGNOSIS — Z170 Estrogen receptor positive status [ER+]: Principal | ICD-10-CM

## 2017-09-28 NOTE — Telephone Encounter (Signed)
I called patient and discussed her abdominal ultrasound findings.  There is a small echogenic focus in the anterior right lobe of liver, possible hemangioma, but a metastatic lesion cannot be excluded.  I recommend a abdominal MRI for further evaluation.  Patient agrees.  She knows to call me after her MRI, which will be likely in 2-3 weeks.  Truitt Merle  09/28/2017

## 2017-10-14 ENCOUNTER — Ambulatory Visit (HOSPITAL_COMMUNITY)
Admission: RE | Admit: 2017-10-14 | Discharge: 2017-10-14 | Disposition: A | Discharge status: Home / Self Care | Payer: Managed Care, Other (non HMO) | Source: Ambulatory Visit | Attending: Hematology | Admitting: Hematology

## 2017-10-14 DIAGNOSIS — D3502 Benign neoplasm of left adrenal gland: Secondary | ICD-10-CM | POA: Insufficient documentation

## 2017-10-14 DIAGNOSIS — K7689 Other specified diseases of liver: Secondary | ICD-10-CM | POA: Insufficient documentation

## 2017-10-14 DIAGNOSIS — K5730 Diverticulosis of large intestine without perforation or abscess without bleeding: Secondary | ICD-10-CM | POA: Insufficient documentation

## 2017-10-14 DIAGNOSIS — Z170 Estrogen receptor positive status [ER+]: Secondary | ICD-10-CM | POA: Insufficient documentation

## 2017-10-14 DIAGNOSIS — C50412 Malignant neoplasm of upper-outer quadrant of left female breast: Secondary | ICD-10-CM

## 2017-10-14 DIAGNOSIS — K769 Liver disease, unspecified: Secondary | ICD-10-CM

## 2017-10-14 MED ORDER — GADOBENATE DIMEGLUMINE 529 MG/ML IV SOLN
20.0000 mL | Freq: Once | INTRAVENOUS | Status: AC | PRN
  Administered 2017-10-14: 18 mL via INTRAVENOUS

## 2017-10-17 ENCOUNTER — Telehealth: Payer: Self-pay | Admitting: *Deleted

## 2017-10-17 MED ORDER — GADOBENATE DIMEGLUMINE 529 MG/ML IV SOLN
20.0000 mL | Freq: Once | INTRAVENOUS | Status: AC | PRN
  Administered 2017-10-17: 17 mL via INTRAVENOUS

## 2017-10-17 NOTE — Telephone Encounter (Signed)
Pt called earlier today for MRI results.  Informed pt no metastatic disease noted per Dr Ernestina Penna request & other findings not of concern.  She was appreciative of message.

## 2017-10-17 NOTE — Telephone Encounter (Signed)
-----   Message from Truitt Merle, MD sent at 10/16/2017  8:39 PM EST ----- Please inform pt about the MRI scan result, no evidence of metastasis. Other findings are not concerns. Thanks.  Truitt Merle  10/16/2017

## 2017-10-24 ENCOUNTER — Other Ambulatory Visit (HOSPITAL_BASED_OUTPATIENT_CLINIC_OR_DEPARTMENT_OTHER): Payer: Managed Care, Other (non HMO)

## 2017-10-24 ENCOUNTER — Telehealth: Payer: Self-pay | Admitting: *Deleted

## 2017-10-24 DIAGNOSIS — Z170 Estrogen receptor positive status [ER+]: Principal | ICD-10-CM

## 2017-10-24 DIAGNOSIS — C50412 Malignant neoplasm of upper-outer quadrant of left female breast: Secondary | ICD-10-CM

## 2017-10-24 LAB — CBC WITH DIFFERENTIAL/PLATELET
BASO%: 1.5 % (ref 0.0–2.0)
Basophils Absolute: 0.1 10*3/uL (ref 0.0–0.1)
EOS ABS: 0.2 10*3/uL (ref 0.0–0.5)
EOS%: 3.1 % (ref 0.0–7.0)
HCT: 41.9 % (ref 34.8–46.6)
HGB: 13.8 g/dL (ref 11.6–15.9)
LYMPH%: 23.2 % (ref 14.0–49.7)
MCH: 28.3 pg (ref 25.1–34.0)
MCHC: 32.9 g/dL (ref 31.5–36.0)
MCV: 86.2 fL (ref 79.5–101.0)
MONO#: 0.6 10*3/uL (ref 0.1–0.9)
MONO%: 8.5 % (ref 0.0–14.0)
NEUT#: 4.7 10*3/uL (ref 1.5–6.5)
NEUT%: 63.7 % (ref 38.4–76.8)
Platelets: 468 10*3/uL — ABNORMAL HIGH (ref 145–400)
RBC: 4.87 10*6/uL (ref 3.70–5.45)
RDW: 13.6 % (ref 11.2–14.5)
WBC: 7.4 10*3/uL (ref 3.9–10.3)
lymph#: 1.7 10*3/uL (ref 0.9–3.3)

## 2017-10-24 LAB — COMPREHENSIVE METABOLIC PANEL
ALT: 63 U/L — AB (ref 0–55)
AST: 31 U/L (ref 5–34)
Albumin: 3.6 g/dL (ref 3.5–5.0)
Alkaline Phosphatase: 162 U/L — ABNORMAL HIGH (ref 40–150)
Anion Gap: 8 mEq/L (ref 3–11)
BILIRUBIN TOTAL: 0.42 mg/dL (ref 0.20–1.20)
BUN: 12.9 mg/dL (ref 7.0–26.0)
CHLORIDE: 106 meq/L (ref 98–109)
CO2: 28 mEq/L (ref 22–29)
CREATININE: 0.9 mg/dL (ref 0.6–1.1)
Calcium: 9.8 mg/dL (ref 8.4–10.4)
EGFR: >60 mL/min/{1.73_m2} (ref >60)
Glucose: 100 mg/dl (ref 70–140)
Potassium: 4.5 mEq/L (ref 3.5–5.1)
Sodium: 143 mEq/L (ref 136–145)
Total Protein: 6.9 g/dL (ref 6.4–8.3)

## 2017-10-24 NOTE — Telephone Encounter (Signed)
Received call from pt asking for results of labs done today.  Reviewed cbc & Cmet with pt.  She has already had MRI & [place].  Will discuss with Dr Burr Medico to see if she has any recommendations.

## 2017-10-25 LAB — GAMMA GT: GGT: 31 IU/L (ref 0–60)

## 2017-10-25 NOTE — Progress Notes (Signed)
Already discussed with pt

## 2017-10-26 ENCOUNTER — Telehealth: Payer: Self-pay | Admitting: *Deleted

## 2017-10-26 NOTE — Telephone Encounter (Signed)
Received vm call from pt asking for explanation of labs.  She states she had seen on [facility] & already spoken to nurse. Returned call & discussed with Dr Burr Medico.  Informed pt that Dr Burr Medico is not super concerned since Alk Phos trending down & scans were OK.  Could have fatty liver or be from aromasin but will repeat labs in Jan & go from there.  She knows not to drink alcohol or take tylenol.  She expressed understanding.

## 2017-11-24 ENCOUNTER — Encounter: Payer: Self-pay | Admitting: Hematology

## 2017-11-24 ENCOUNTER — Ambulatory Visit (HOSPITAL_BASED_OUTPATIENT_CLINIC_OR_DEPARTMENT_OTHER): Payer: Managed Care, Other (non HMO) | Admitting: Hematology

## 2017-11-24 ENCOUNTER — Telehealth: Payer: Self-pay | Admitting: Hematology

## 2017-11-24 ENCOUNTER — Other Ambulatory Visit (HOSPITAL_BASED_OUTPATIENT_CLINIC_OR_DEPARTMENT_OTHER): Payer: Managed Care, Other (non HMO)

## 2017-11-24 VITALS — BP 132/65 | HR 78 | Temp 98.0°F | Resp 18 | Ht 64.0 in | Wt 183.9 lb

## 2017-11-24 DIAGNOSIS — Z170 Estrogen receptor positive status [ER+]: Principal | ICD-10-CM

## 2017-11-24 DIAGNOSIS — C50412 Malignant neoplasm of upper-outer quadrant of left female breast: Secondary | ICD-10-CM | POA: Diagnosis not present

## 2017-11-24 DIAGNOSIS — N951 Menopausal and female climacteric states: Secondary | ICD-10-CM | POA: Diagnosis not present

## 2017-11-24 DIAGNOSIS — R10811 Right upper quadrant abdominal tenderness: Secondary | ICD-10-CM | POA: Diagnosis not present

## 2017-11-24 LAB — CBC WITH DIFFERENTIAL/PLATELET
BASO%: 1.2 % (ref 0.0–2.0)
Basophils Absolute: 0.1 10*3/uL (ref 0.0–0.1)
EOS%: 2.2 % (ref 0.0–7.0)
Eosinophils Absolute: 0.2 10*3/uL (ref 0.0–0.5)
HCT: 42.9 % (ref 34.8–46.6)
HEMOGLOBIN: 14 g/dL (ref 11.6–15.9)
LYMPH%: 18.5 % (ref 14.0–49.7)
MCH: 28.3 pg (ref 25.1–34.0)
MCHC: 32.6 g/dL (ref 31.5–36.0)
MCV: 86.6 fL (ref 79.5–101.0)
MONO#: 0.6 10*3/uL (ref 0.1–0.9)
MONO%: 6.7 % (ref 0.0–14.0)
NEUT%: 71.4 % (ref 38.4–76.8)
NEUTROS ABS: 6 10*3/uL (ref 1.5–6.5)
Platelets: 479 10*3/uL — ABNORMAL HIGH (ref 145–400)
RBC: 4.95 10*6/uL (ref 3.70–5.45)
RDW: 13.7 % (ref 11.2–14.5)
WBC: 8.4 10*3/uL (ref 3.9–10.3)
lymph#: 1.6 10*3/uL (ref 0.9–3.3)

## 2017-11-24 LAB — COMPREHENSIVE METABOLIC PANEL
ALBUMIN: 3.5 g/dL (ref 3.5–5.0)
ALK PHOS: 163 U/L — AB (ref 40–150)
ALT: 31 U/L (ref 0–55)
ANION GAP: 8 meq/L (ref 3–11)
AST: 15 U/L (ref 5–34)
BUN: 14.7 mg/dL (ref 7.0–26.0)
CHLORIDE: 107 meq/L (ref 98–109)
CO2: 27 mEq/L (ref 22–29)
CREATININE: 0.8 mg/dL (ref 0.6–1.1)
Calcium: 9.4 mg/dL (ref 8.4–10.4)
EGFR: >60 mL/min/{1.73_m2} (ref >60)
Glucose: 82 mg/dl (ref 70–140)
POTASSIUM: 4.2 meq/L (ref 3.5–5.1)
Sodium: 141 mEq/L (ref 136–145)
Total Bilirubin: 0.22 mg/dL (ref 0.20–1.20)
Total Protein: 7 g/dL (ref 6.4–8.3)

## 2017-11-24 NOTE — Progress Notes (Signed)
[place]  [phone] [phone]  Clinic Follow Up Note   Patient Care Team: Aloha Gell, MD as PCP - General (Obstetrics and Gynecology) Kyung Rudd, MD as Consulting Physician (Radiation Oncology) Truitt Merle, MD as Consulting Physician (Hematology) Rolm Bookbinder, MD as Consulting Physician (General Surgery) Gardenia Phlegm, NP as Nurse Practitioner (Hematology and Oncology)   Date of Service:  11/24/2017  CHIEF COMPLAINTS:  Follow up left breast cancer  Oncology History   Breast cancer of upper-outer quadrant of left female breast [facility])   Staging form: Breast, AJCC 7th Edition   - Clinical stage from 09/06/2016: Stage IIA (T1b, N1, M0) - Signed by Truitt Merle, MD on 09/15/2016   - Pathologic stage from 10/05/2016: Stage IIA (T1b, N1a, cM0) - Signed by Truitt Merle, MD on 10/24/2016      Breast cancer of upper-outer quadrant of left female breast ([place])   09/06/2016 Initial Diagnosis    Malignant neoplasm of upper-outer quadrant of right female breast ([place])      09/06/2016 Initial Biopsy    Left breast 3:00 position showed invasive ductal carcinoma and DCIS, left axillary lymph node biopsy showed metastatic carcinoma with extracapsular extension      09/06/2016 Receptors her2    Breast tumor ER 100% positive, PR 100% positive, HER-2 negative, Ki-67 5%      09/06/2016 Mammogram    Diagnostic mammogram and ultrasound showed a 1.0 cm mass at the 3 clock position of left breast, and the enlarged left axillary node with cortical thickening. The 101m cm oval cyst in the right breast is likely benign.      09/06/2016 Miscellaneous    Mammaprint showed low risk luminal A type, with average 10 year risk of recurrence untreated 10%.      09/16/2016 Genetic Testing    Genetic testing was normal, and did not reveal a deleterious mutation  One variant of uncertain significance (VUS) was found in the BRIP1 gene.  Genes tested include:  ATM, BARD1, BRCA1, BRCA2, BRIP1, CDH1, CHEK2, FANCC, MLH1, MSH2, MSH6, NBN, PALB2, PMS2, PTEN, RAD51C, RAD51D, TP53, and XRCC2.  This panel also includes deletion/duplication analysis (without sequencing) for one gene, EPCAM.        10/05/2016 Surgery    Left breast lumpectomy and SLN biopsy       10/05/2016 Pathology Results    Left breast lumpectomy showed invasive and in situ ductal carcinoma, 1 cm, grade 1, margins are negative, 2 sentinel lymph nodes are positive for metastatic carcinoma with extracapsular extension. LVI (+)      11/08/2016 - 12/24/2016 Radiation Therapy    Adjuvant radiation (Dr. MTobe Sos The Left breast was treated to 50.4 Gy in 28 fractions of 1.8 Gy per fraction. 2. The Left axilla was treated to 50.4 Gy in 28 fractions of 1.8 Gy per fraction.  3. The Left breast was boosted to 10 Gy in 5 fractions of 2 Gy per fraction.        01/10/2017 -  Anti-estrogen oral therapy    Exemestane 281mdaily      04/14/2017 Surgery    LAPAROSCOPIC BILATERAL SALPINGO OOPHORECTOMY by Dr. FoPamala Hurry    09/27/2017 Imaging     [place] 09/27/17  IMPRESSION: 1. Small echogenic focus in the anterior right lobe of liver with diminished echogenicity peripherally. Possible atypical hemangioma but a metastatic deposit cannot be excluded. Consider CT or MRI of the abdomen to assess further. 2. No gallstones. 3. Portions of the head  and tail of the pancreas are obscured by bowel gas.      10/14/2017 Imaging    MRI Abd W WO Contrast 10/14/17  IMPRESSION: 1. 8 by 4 mm lesion with signal and enhancement characteristics most typical for a small hemangioma in segment IVb of the liver. No other candidate lesion to correspond with the sonographic finding. No MRI findings of suspicion for hepatic metastatic disease. 2. Small left adrenal adenoma. 3. Scattered descending colon diverticula.       HISTORY OF PRESENTING ILLNESS:  Gloria Smith 47 y.o. female is here because of her  recently diagnosed left breast cancer. She presents to our multidisciplinary breast clinic with her husband.    She had screening mammogram on 09/01/2016 at her gynecologist office, which showed a bilateral breast nodules. Her previous mammogram was 4 years ago which was normal. She denies any significant symptoms, except occasional mild left breast shooting pain for the past few months, no palpable mass, skin change or nipple discharge. She feels well overall. She gained about 20lbs in the past 6 months   She was referred to [facility] for diagnostic mammogram and ultrasound, which showed a benign 53m cyst in the right breast, and a 1 cm mass in the 3:00 position of left breast, and I enlarged left axillary node. Both the left breast mass and axillary node biopsy showed invasive ductal carcinoma, ER/PR strongly positive, HER-2 negative.  GYN HISTORY  Menarchal: 12 LMP: spotting 08/2016, she has IUD ([name]:  HRT: n/a G4P1: she has 160yo son   CURRENT THERAPY: Exemestane 25 mg Daily started 01/10/17   INTERIM HISTORY:  Gloria CABANILLAreturns for follow up. She presents to the clinic today complaining of nagging and intermittently sharp pain just under her right lateral ribcage. She states that her typical episodes of pain last for several hours and this is usually variable. Her pain usually resolves on its own, but she reports that this does occur daily. We did recently evaluate this with MRI abdomen which was only remarkable for a small liver hemangioma.   MEDICAL HISTORY:  Past Medical History:  Diagnosis Date  . Arthritis   . Headache    ocular migraines - very rare  . Malignant neoplasm of upper-outer quadrant of right female breast ([place] 09/10/2016   pt states that cancer is on left breast, not right  . Urinary frequency     SURGICAL HISTORY: Past Surgical History:  Procedure Laterality Date  . BREAST LUMPECTOMY WITH RADIOACTIVE SEED AND SENTINEL LYMPH NODE BIOPSY  Left 10/05/2016   Procedure: LEFT BREAST RADIOACTIVE SEED GUIDED LUMPECTOMY, LEFT AXILLARY RADIOACTIVE SEED GUIDED NODE EXCISION, LEFT AXILLARY SENTINEL LYMPH NODE BIOPSY(LEFT TARGETED AXILLARY DISSECTION);  Surgeon: MRolm Bookbinder MD;  [facility]  Service: General;  Laterality: Left;  . DENTAL SURGERY    . DILATION AND CURETTAGE OF UTERUS    . LAPAROSCOPIC BILATERAL SALPINGO OOPHERECTOMY Bilateral 04/14/2017   Procedure: LAPAROSCOPIC BILATERAL SALPINGO OOPHORECTOMY;  Surgeon: FAloha Gell MD;  [facility];  Service: Gynecology;  Laterality: Bilateral;  . right knee surgery at age of 89      SOCIAL HISTORY: Social History   Socioeconomic History  . Marital status: Single    Spouse name: Not on file  . Number of children: Not on file  . Years of education: Not on file  . Highest education level: Not on file  Social Needs  . Financial resource strain: Not on file  . Food insecurity -  worry: Not on file  . Food insecurity - inability: Not on file  . Transportation needs - medical: Not on file  . Transportation needs - non-medical: Not on file  Occupational History  . Not on file  Tobacco Use  . Smoking status: Former Smoker    Packs/day: 0.50    Years: 30.00    Pack years: 15.00    Last attempt to quit: 08/22/2016    Years since quitting: 1.2  . Smokeless tobacco: Never Used  Substance and Sexual Activity  . Alcohol use: Yes    Alcohol/week: 0.6 oz    Types: 1 Glasses of wine per week    Comment: 6 drinks wine or beer a week   . Drug use: No  . Sexual activity: Not on file  Other Topics Concern  . Not on file  Social History Narrative  . Not on file   She works as a Regulatory affairs officer, works from home   FAMILY HISTORY: Family History  Problem Relation Age of Onset  . Colon cancer Paternal Grandmother 95       d. 14  . Breast cancer Cousin        paternal 1st cousin, once-removed; dx. 25s  . Other Mother        hx precancerous skin finding removed  from eyelid at 70-71  . Heart attack Maternal Grandmother 70  . Stroke Maternal Grandfather   . Heart attack Paternal Grandfather 42  . Other Other 25       hx of precancerous finding on abnormal pap smear  . Cancer Other        maternal great aunt (MGM's sister) d. early 76s; spinal cancer, but this may have been a secondary cancer w/ NOS primary  . Cancer Other        maternal great grandmother (MGM's mother) d. 23s w/ NOS cancer  . Cancer Other        paternal great uncle (PGM's brother) d. 87s w/ NOS cancer    ALLERGIES:  has No Known Allergies.  MEDICATIONS:  Current Outpatient Medications  Medication Sig Dispense Refill  . exemestane (AROMASIN) 25 MG tablet take 1 tablet by mouth once daily AFTER BREAKFAST 90 tablet 2  . PARoxetine (PAXIL) 10 MG tablet Take 10 mg by mouth daily.  0  . oxyCODONE-acetaminophen (ROXICET) 5-325 MG tablet Take 1-2 tablets by mouth every 4 (four) hours as needed for severe pain. (Patient not taking: Reported on 05/09/2017) 20 tablet 0   No current facility-administered medications for this visit.     REVIEW OF SYSTEMS:   Constitutional: Denies fevers, chills or abnormal night sweats (+) Hot flashes Eyes: Denies blurriness of vision, double vision or watery eyes Ears, nose, mouth, throat, and face: Denies mucositis or sore throat Respiratory: Denies cough, dyspnea or wheezes Cardiovascular: Denies palpitation, chest discomfort or lower extremity swelling. Gastrointestinal:  Denies nausea, heartburn or change in bowel habits Skin: Denies rashes.  MSK: (+) pain surrounding incisional site on left chest wall. (+) arthralgic pain into the fingers Lymphatics: Denies new lymphadenopathy or easy bruising Neurological:Denies numbness, tingling or new weaknesses Behavioral/Psych: no new changes.  All other systems were reviewed with the patient and are negative.  PHYSICAL EXAMINATION: ECOG PERFORMANCE STATUS: 0 - Asymptomatic  Vitals:   11/24/17 0915   BP: 132/65  Pulse: 78  Resp: 18  Temp: 98 F (36.7 C)  SpO2: 99%   Filed Weights   11/24/17 0915  Weight: 183 lb 14.4 oz (83.4  kg)    GENERAL:alert, no distress and comfortable SKIN: skin color, texture, turgor are normal, no rashes or significant lesions EYES: normal, conjunctiva are pink and non-injected, sclera clear OROPHARYNX:no exudate, no erythema and lips, buccal mucosa, and tongue normal  NECK: supple, thyroid normal size, non-tender, without nodularity LYMPH:  no palpable lymphadenopathy in the cervical, axillary or inguinal LUNGS: clear to auscultation and percussion with normal breathing effort HEART: regular rate & rhythm and no murmurs and no lower extremity edema ABDOMEN:abdomen soft, normal bowel sounds. She has some tenderness to deep palpation in the RUQ of the abdomen. No palpable organomegaly.   Musculoskeletal:no cyanosis of digits and no clubbing  PSYCH: alert & oriented x 3 with fluent speech NEURO: no focal motor/sensory deficits. Breasts: Breast inspection showed them to be symmetrical with no nipple discharge. Palpation of the breasts and axilla revealed no obvious mass that I could appreciate. Small scar behind the older incision in the left breast UOQ. Left breast also with some mild lymphedema, but otherwise unremarkable.  LABORATORY DATA:  I have reviewed the data as listed CBC Latest Ref Rng & Units 11/24/2017 10/24/2017 09/22/2017  WBC 3.9 - 10.3 10e3/uL 8.4 7.4 9.6  Hemoglobin 11.6 - 15.9 g/dL 14.0 13.8 14.2  Hematocrit 34.8 - 46.6 % 42.9 41.9 42.2  Platelets 145 - 400 10e3/uL 479(H) 468(H) 459(H)   CMP Latest Ref Rng & Units 11/24/2017 10/24/2017 09/22/2017  Glucose 70 - 140 mg/dl 82 100 124  BUN 7.0 - 26.0 mg/dL 14.7 12.9 16.7  Creatinine 0.6 - 1.1 mg/dL 0.8 0.9 0.9  Sodium 136 - 145 mEq/L 141 143 141  Potassium 3.5 - 5.1 mEq/L 4.2 4.5 4.0  Chloride 101 - 111 mmol/L - - -  CO2 22 - 29 mEq/L '27 28 27  ' Calcium 8.4 - 10.4 mg/dL 9.4 9.8 9.7  Total  Protein 6.4 - 8.3 g/dL 7.0 6.9 6.9  Total Bilirubin 0.20 - 1.20 mg/dL 0.22 0.42 0.54  Alkaline Phos 40 - 150 U/L 163(H) 162(H) 175(H)  AST 5 - 34 U/L '15 31 13  ' ALT 0 - 55 U/L 31 63(H) 25     PATHOLOGY REPORT  Diagnosis 04/14/17 Ovaries and fallopian tubes, bilateral - BENIGN OVARIES WITH INCLUSION CYST. - BENIGN FALLOPIAN TUBES WITH PARATUBAL CYSTS.  Diagnosis 09/06/2016 1. Breast, left, needle core biopsy, 3 o'clock - INVASIVE DUCTAL CARCINOMA. - DUCTAL CARCINOMA IN SITU.  - SEE COMMENT. 2. Lymph node, axilla and upper limb, left - METASTATIC CARCINOMA IN 1 OF 1 LYMPH NODE (1/1), WITH EXTRACAPSULAR EXTENSION. Microscopic Comment 1. The carcinoma appears grade 2. A breast prognostic profile will be performed and the results reported separately. The results were called to [facility] on 09/07/16. (JBK:gt, 09/07/16) 1. PROGNOSTIC INDICATORS Results: IMMUNOHISTOCHEMICAL AND MORPHOMETRIC ANALYSIS PERFORMED MANUALLY Estrogen Receptor: 100%, POSITIVE, STRONG STAINING INTENSITY Progesterone Receptor: 100%, POSITIVE, STRONG STAINING INTENSITY Proliferation Marker Ki67: 5%  Results: HER2 - NEGATIVE RATIO OF HER2/CEP17 SIGNALS 1.46 AVERAGE HER2 COPY NUMBER PER CELL 3.00  Diagnosis 10/05/2016 1. Breast, lumpectomy, Left - INVASIVE AND IN SITU DUCTAL CARCINOMA, 1 CM. - MARGINS NOT INVOLVED. - FIBROCYSTIC CHANGES. 2. Lymph node, sentinel, biopsy, Left #1 - METASTATIC CARCINOMA IN ONE LYMPH NODE WITH EXTRACAPSULAR EXTENSION (1/1). 3. Lymph node, sentinel, biopsy, Left #2 - METASTATIC CARCINOMA IN ONE LYMPH NODE WITH EXTRACAPSULAR EXTENSION (1/1). Microscopic Comment 1. BREAST, INVASIVE TUMOR, WITH LYMPH NODES PRESENT Specimen, including laterality and lymph node sampling (sentinel, non-sentinel): Left breast with two sentinel lymph nodes  Procedure: Localized lumpectomy and sentinel lymph node biopsies. Histologic type: Ductal Grade: 1 Tubule formation:  1 Nuclear pleomorphism: 2 Mitotic: 1 Tumor size (gross measurement) 1 cm Margins: Free of tumor Invasive, distance to closest margin: 0.3 cm from anterior margin In-situ, distance to closest margin: 0.2 cm from anterior margin If margin positive, focally or broadly: N/A Lymphovascular invasion: Present Ductal carcinoma in situ: Present Grade: Low grade Extensive intraductal component: No Lobular neoplasia: No Tumor focality: Unifocal Extent of tumor: 1 of 3 FINAL for ZYARA, RILING ([id number]) Microscopic Comment(continued) Skin: N/A Nipple: N/A Skeletal muscle: N/A Lymph nodes: Examined: 2 Sentinel 0 Non-sentinel 2 Total Lymph nodes with metastasis: 2 Isolated tumor cells (< 0.2 mm): 0 Micrometastasis: (> 0.2 mm and < 2.0 mm): 0 Macrometastasis: (> 2.0 mm): 2 Extracapsular extension: Present, both nodes Breast prognostic profile: Case [phone] Estrogen receptor: 100% and 95%, positive Progesterone receptor: 100% and 100%, positive Her 2 neu: Negative, ratio is 1.46 and 1.45 Ki-67: 5% and 10% Non-neoplastic breast: Fibrocystic changes TNM: pT1b, pN1a, pMX (JDP:kh 10-06-16) Claudette Laws MD Pathologist, Electronic Signature (Case signed 10/06/2016)   Mammaprint 09/06/2016      RADIOGRAPHIC STUDIES: I have personally reviewed the radiological images as listed and agreed with the findings in the report. No results found.  [place] Abd 09/27/17  IMPRESSION: 1. Small echogenic focus in the anterior right lobe of liver with diminished echogenicity peripherally. Possible atypical hemangioma but a metastatic deposit cannot be excluded. Consider CT or MRI of the abdomen to assess further. 2. No gallstones. 3. Portions of the head and tail of the pancreas are obscured by bowel gas.   MRI Abd W WO Contrast 10/14/17  IMPRESSION: 1. 8 by 4 mm lesion with signal and enhancement characteristics most typical for a small hemangioma in segment IVb of the liver. No  other candidate lesion to correspond with the sonographic finding. No MRI findings of suspicion for hepatic metastatic disease. 2. Small left adrenal adenoma. 3. Scattered descending colon diverticula.   Diagnostic Mammogram at [facility] 09/05/17  IMPRESSION:  There is no mammographic evidnece of malignancy. Routine mammographic evaluation in 1 year is recommeneded.    Bone Density at [facility] 09/05/17  IMPRESSION  Based on the WHO criteris this patient has BONE DENSITY WITHIN NORMAL LIMITS   ASSESSMENT & PLAN:  47 y.o. pre-or perimenopausal woman, presented with screening discovered left breast mass and axilla adenopathy.  1. Breast cancer of upper-outer quadrant of left female breast, pT1bN1aM0 stage IIA, G1, ER+/PR+/HER2-, Ki67 5% -I previously discussed her surgical pathology results with the patient in details. -She has had a complete surgical resections, margins were negative, she had 2 positive sentinel lymph nodes with extracapsular extension, (+) LVI which are risk for recurrence.  -We did mammaprint on her initial biopsy tumor tissue, which showed low risk disease, luminal type A. Her risk of recurrence in 10 years without adjuvant therapy is 10%. Her 5 year distant metastasis free survival is 97.8% with adjuvant antiestrogen therapy. Based on the mammaprint results, I do not recommend adjuvant chemotherapy. -She has started exemestane with ovarian suppression, now status post BSO. She is tolerating exemestane well, we'll continue for 7 years -Due to the tenderness in the right upper quadrant, and elevated alkaline phosphatase, I obtained an ultrasound of abdomen and liver MRI scans. -We disucssed her 09/2017 abdominal MRI and [place], these were only remarkable except for a small liver hemangioma.  -She is clinically doing well, lab results reviewed, exam was unremarkable, no clinical  concern for recurrence. -Continue breast cancer surveillance. Mammogram and Bone density Scan was done at  [facility] 08/2017 were normal.  -Slightly elevated Alkaline Phosphatase level in the past 3 months, overall mild and stable, GGT was normal. I will continue monitoring the lab, if alkaline phosphatase at increase this further, I will obtain a bone scan to rule out bony metastasis. -She continues to have elevated platelets (479 today, 11/24/17). She does not have any chronic illnesses, we will continue to monitor this closely with repeat labs.   2. Genetics  -Giving her young age and positive family history of breast cancer, she'll be referred to genetic counselor for genetic testing, to ruled out inheritable breast cancer syndrome. -Negative for known pathogenic mutations within any of 20 genes on the Breast/Ovarian Cancer Panel through [facility] [employer] [facility].  One variant of uncertain significance (VUS) called "[id number] (p.Ile1191Val)" was found in one copy of the BRIP1 gene.  The Breast/Ovarian Cancer Panel offered by [facility] Hope Pigeon, MD) includes sequencing and deletion/duplication analysis for the following 19 genes:  ATM, BARD1, BRCA1, BRCA2, BRIP1, CDH1, CHEK2, FANCC, MLH1, MSH2, MSH6, NBN, PALB2, PMS2, PTEN, RAD51C, RAD51D, TP53, and XRCC2.  This panel also includes deletion/duplication analysis (without sequencing) for one gene, EPCAM.   3. Hot flash - secondary to BSO and exemestane -She is on Paxil now, tolerating well and her hot flashes have been more manageable.  -Continue monitoring  4. Bone Health -Normal DEXA in 08/2017 -We discussed menopause and exemestane weaks her bone.   5. RUQ tenderness  -I did examine her abdomen today and she did experience some tenderness with palpation to the RUQ. [place] and MRI were with benign findings, her pain is likely MSK related. I encouraged her to use OTC pain medications to help with her pain and to frequently stretch and to remain active.   PLAN -continue exemestane  -Lab in two months to f/u elevated alkaline phosphatase -f/u in 4  months with labs   All questions were answered. The patient knows to call the clinic with any problems, questions or concerns. I spent 25 minutes counseling the patient face to face. The total time spent in the appointment was 30 minutes and more than 50% was on counseling.    Truitt Merle, MD 11/24/2017   This document serves as a record of services personally performed by Truitt Merle, MD. It was created on her behalf by Reola Mosher, a trained medical scribe. The creation of this record is based on the scribe's personal observations and the provider's statements to them. This document has been checked and approved by the attending provider.  I have reviewed the above documentation for accuracy and completeness, and I agree with the above.

## 2017-11-24 NOTE — Telephone Encounter (Signed)
Gave avs and calendar for march and may  °

## 2018-01-23 ENCOUNTER — Other Ambulatory Visit: Payer: Managed Care, Other (non HMO)

## 2018-03-23 NOTE — Progress Notes (Signed)
[place]  [phone] [phone]  Clinic Follow Up Note   Patient Care Team: Aloha Gell, MD as PCP - General (Obstetrics and Gynecology) Kyung Rudd, MD as Consulting Physician (Radiation Oncology) Truitt Merle, MD as Consulting Physician (Hematology) Rolm Bookbinder, MD as Consulting Physician (General Surgery) Gardenia Phlegm, NP as Nurse Practitioner (Hematology and Oncology)   Date of Service:  03/24/2018  CHIEF COMPLAINTS:  Follow up left breast cancer  Oncology History   Breast cancer of upper-outer quadrant of left female breast Gloria Smith)   Staging form: Breast, AJCC 7th Edition   - Clinical stage from 09/06/2016: Stage IIA (T1b, N1, M0) - Signed by Truitt Merle, MD on 09/15/2016   - Pathologic stage from 10/05/2016: Stage IIA (T1b, N1a, cM0) - Signed by Truitt Merle, MD on 10/24/2016      Breast cancer of upper-outer quadrant of left female breast ([place])   09/06/2016 Initial Diagnosis    Malignant neoplasm of upper-outer quadrant of right female breast ([place])      09/06/2016 Initial Biopsy    Left breast 3:00 position showed invasive ductal carcinoma and DCIS, left axillary lymph node biopsy showed metastatic carcinoma with extracapsular extension      09/06/2016 Receptors her2    Breast tumor ER 100% positive, PR 100% positive, HER-2 negative, Ki-67 5%      09/06/2016 Mammogram    Diagnostic mammogram and ultrasound showed a 1.0 cm mass at the 3 clock position of left breast, and the enlarged left axillary node with cortical thickening. The 58m cm oval cyst in the right breast is likely benign.      09/06/2016 Miscellaneous    Mammaprint showed low risk luminal A type, with average 10 year risk of recurrence untreated 10%.      09/16/2016 Genetic Testing    Genetic testing was normal, and did not reveal a deleterious mutation  One variant of uncertain significance (VUS) was found in the BRIP1 gene.  Genes tested include:  ATM, BARD1, BRCA1, BRCA2, BRIP1, CDH1, CHEK2, FANCC, MLH1, MSH2, MSH6, NBN, PALB2, PMS2, PTEN, RAD51C, RAD51D, TP53, and XRCC2.  This panel also includes deletion/duplication analysis (without sequencing) for one gene, EPCAM.        10/05/2016 Surgery    Left breast lumpectomy and SLN biopsy       10/05/2016 Pathology Results    Left breast lumpectomy showed invasive and in situ ductal carcinoma, 1 cm, grade 1, margins are negative, 2 sentinel lymph nodes are positive for metastatic carcinoma with extracapsular extension. LVI (+)      11/08/2016 - 12/24/2016 Radiation Therapy    Adjuvant radiation (Dr. MTobe Sos The Left breast was treated to 50.4 Gy in 28 fractions of 1.8 Gy per fraction. 2. The Left axilla was treated to 50.4 Gy in 28 fractions of 1.8 Gy per fraction.  3. The Left breast was boosted to 10 Gy in 5 fractions of 2 Gy per fraction.        01/10/2017 -  Anti-estrogen oral therapy    Exemestane 213mdaily      04/14/2017 Surgery    LAPAROSCOPIC BILATERAL SALPINGO OOPHORECTOMY by Dr. FoPamala Hurry    09/27/2017 Imaging     [place] 09/27/17  IMPRESSION: 1. Small echogenic focus in the anterior right lobe of liver with diminished echogenicity peripherally. Possible atypical hemangioma but a metastatic deposit cannot be excluded. Consider CT or MRI of the abdomen to assess further. 2. No gallstones. 3. Portions of the head  and tail of the pancreas are obscured by bowel gas.      10/14/2017 Imaging    MRI Abd W WO Contrast 10/14/17  IMPRESSION: 1. 8 by 4 mm lesion with signal and enhancement characteristics most typical for a small hemangioma in segment IVb of the liver. No other candidate lesion to correspond with the sonographic finding. No MRI findings of suspicion for hepatic metastatic disease. 2. Small left adrenal adenoma. 3. Scattered descending colon diverticula.       HISTORY OF PRESENTING ILLNESS:  Gloria Smith 47 y.o. female is here because of her  recently diagnosed left breast cancer. She presents to our multidisciplinary breast clinic with her husband.    She had screening mammogram on 09/01/2016 at her gynecologist office, which showed a bilateral breast nodules. Her previous mammogram was 4 years ago which was normal. She denies any significant symptoms, except occasional mild left breast shooting pain for the past few months, no palpable mass, skin change or nipple discharge. She feels well overall. She gained about 20lbs in the past 6 months   She was referred to [facility] for diagnostic mammogram and ultrasound, which showed a benign 11m cyst in the right breast, and a 1 cm mass in the 3:00 position of left breast, and I enlarged left axillary node. Both the left breast mass and axillary node biopsy showed invasive ductal carcinoma, ER/PR strongly positive, HER-2 negative.  GYN HISTORY  Menarchal: 12 LMP: spotting 08/2016, she has IUD (Gloria Smith Contraceptive:  HRT: n/a G4P1: she has 182yo son   CURRENT THERAPY: Exemestane 25 mg Daily started 01/10/17, pt stopped in mid March 2019 due to insurance issue     INTERIM HISTORY:  Gloria SCAGLIONEreturns for follow up. 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 She has not had health insurance since and she has not been taking her medication due to cost. Stating it cost almost $600. SKendahlhas been doing well overall since not taking her medication. She has lost about 3 pounds since 11/2017 and she adds that she has been exercising more. She walks her dogs about 2 miles every day. She also has been drinking more water. She adds that her hot flashes are bearable. She denies abdominal pain, fever, chills or any other symptoms or complaints at this time.   MEDICAL HISTORY:  Past Medical History:  Diagnosis Date  . Arthritis   . Headache    ocular migraines - very rare  . [place]ignant [place]    pt states that cancer is on left breast, not right  . Urinary frequency     SURGICAL HISTORY: Past Surgical History:  Procedure Laterality Date  . BREAST LUMPECTOMY WITH RADIOACTIVE SEED AND SENTINEL LYMPH NODE BIOPSY Left 10/05/2016   Procedure: LEFT BREAST RADIOACTIVE SEED GUIDED LUMPECTOMY, LEFT AXILLARY RADIOACTIVE SEED GUIDED NODE EXCISION, LEFT AXILLARY SENTINEL LYMPH NODE BIOPSY(LEFT TARGETED AXILLARY DISSECTION);  Surgeon: MRolm Bookbinder MD;  [facility]  Service: General;  Laterality: Left;  . DENTAL SURGERY    . DILATION AND CURETTAGE OF UTERUS    . LAPAROSCOPIC BILATERAL SALPINGO OOPHERECTOMY Bilateral 04/14/2017   Procedure: LAPAROSCOPIC BILATERAL SALPINGO OOPHORECTOMY;  Surgeon: FAloha Gell MD;  [facility];  Service: Gynecology;  Laterality: Bilateral;  . right knee surgery at age of 89      SOCIAL HISTORY: Social History   Socioeconomic History  . Marital  status: Single    Spouse name: Not on file  . Number of children: Not on file  . Years of education: Not on file  . Highest education level: Not on file  Occupational History  . Not on file  Social Needs  . Financial resource strain: Not on file  . Food insecurity:    Worry: Not on file    Inability: Not on file  . Transportation needs:    Medical: Not on file    Non-medical: Not on file  Tobacco Use  . Smoking status: Former Smoker    Packs/day: 0.50    Years: 30.00    Pack years: 15.00    Last attempt to quit: 08/22/2016    Years since quitting: 1.5  . Smokeless tobacco: Never Used  Substance and Sexual Activity  . Alcohol use: Yes    Alcohol/week: 0.6 oz    Types: 1 Glasses of wine per week    Comment: 6 drinks wine or beer a week   . Drug use: No  . Sexual activity: Not on file  Lifestyle  . Physical activity:    Days per week: Not on file    Minutes per session: Not on file  . Stress: Not on file  Relationships  . Social connections:    Talks on phone: Not on file     Gets together: Not on file    Attends religious service: Not on file    Active member of club or organization: Not on file    Attends meetings of clubs or organizations: Not on file    Relationship status: Not on file  . Intimate partner violence:    Fear of current or ex partner: Not on file    Emotionally abused: Not on file    Physically abused: Not on file    Forced sexual activity: Not on file  Other Topics Concern  . Not on file  Social History Narrative  . Not on file   She works as a Forensic psychologist  FAMILY HISTORY: Family History  Problem Relation Age of Onset  . Colon cancer Paternal Grandmother 95       d. 30  . Breast cancer Cousin        paternal 1st cousin, once-removed; dx. 21s  . Other Mother        hx precancerous skin finding removed from eyelid at 70-71  . Heart attack Maternal Grandmother 70  . Stroke Maternal Grandfather   . Heart attack Paternal Grandfather 36  . Other Other 25       hx of precancerous finding on abnormal pap smear  . Cancer Other        maternal great aunt (MGM's sister) d. early 44s; spinal cancer, but this may have been a secondary cancer w/ NOS primary  . Cancer Other        maternal great grandmother (MGM's mother) d. 74s w/ NOS cancer  . Cancer Other        paternal great uncle (PGM's brother) d. 22s w/ NOS cancer    ALLERGIES:  has No Known Allergies.  MEDICATIONS:  Current Outpatient Medications  Medication Sig Dispense Refill  . letrozole (FEMARA) 2.5 MG tablet Take 2.5 mg by mouth daily.    Marland Kitchen oxyCODONE-acetaminophen (ROXICET) 5-325 MG tablet Take 1-2 tablets by mouth every 4 (four) hours as needed for severe pain. (Patient not taking: Reported on 05/09/2017) 20 tablet 0  . PARoxetine (PAXIL) 10 MG tablet Take 10  mg by mouth daily.  0   No current facility-administered medications for this visit.     REVIEW OF SYSTEMS:   Constitutional: Denies fevers, chills or abnormal night sweats (+) Hot flashes Eyes: Denies  blurriness of vision, double vision or watery eyes Ears, nose, mouth, throat, and face: Denies mucositis or sore throat Respiratory: Denies cough, dyspnea or wheezes Cardiovascular: Denies palpitation, chest discomfort or lower extremity swelling. Gastrointestinal:  Denies nausea, heartburn or change in bowel habits Skin: Denies rashes.  MSK: (+) numbness surrounding incisional site on left chest wall. (-) arthralgic pain into the fingers Lymphatics: Denies new lymphadenopathy or easy bruising Neurological:Denies numbness, tingling or new weaknesses Behavioral/Psych: no new changes.  All other systems were reviewed with the patient and are negative.  PHYSICAL EXAMINATION: ECOG PERFORMANCE STATUS: 0 - Asymptomatic  Vitals:   03/24/18 0811  BP: 114/67  Pulse: 71  Resp: 14  Temp: 97.8 F (36.6 C)  SpO2: 99%   Filed Weights   03/24/18 0811  Weight: 180 lb 6.4 oz (81.8 kg)    GENERAL:alert, no distress and comfortable SKIN: skin color, texture, turgor are normal, no rashes or significant lesions EYES: normal, conjunctiva are pink and non-injected, sclera clear OROPHARYNX:no exudate, no erythema and lips, buccal mucosa, and tongue normal  NECK: supple, thyroid normal size, non-tender, without nodularity LYMPH:  no palpable lymphadenopathy in the cervical, axillary or inguinal LUNGS: clear to auscultation and percussion with normal breathing effort HEART: regular rate & rhythm and no murmurs and no lower extremity edema ABDOMEN:abdomen soft, normal bowel sounds. She has some tenderness to deep palpation in the RUQ of the abdomen. No palpable organomegaly.   Musculoskeletal:no cyanosis of digits and no clubbing  PSYCH: alert & oriented x 3 with fluent speech NEURO: no focal motor/sensory deficits. Breasts: Breast inspection showed them to be symmetrical with no nipple discharge. Palpation of the breasts and axilla revealed no obvious mass that I could appreciate. Scar tissue in the  left breast UOQ. Left breast also with some mild lymphedema, but otherwise unremarkable.   LABORATORY DATA:  I have reviewed the data as listed CBC Latest Ref Rng & Units 03/24/2018 11/24/2017 10/24/2017  WBC 3.9 - 10.3 K/uL 8.0 8.4 7.4  Hemoglobin 11.6 - 15.9 g/dL 13.6 14.0 13.8  Hematocrit 34.8 - 46.6 % 40.7 42.9 41.9  Platelets 145 - 400 K/uL 431(H) 479(H) 468(H)   CMP Latest Ref Rng & Units 03/24/2018 11/24/2017 10/24/2017  Glucose 70 - 140 mg/dL 100 82 100  BUN 7 - 26 mg/dL 15 14.7 12.9  Creatinine 0.60 - 1.10 mg/dL 0.82 0.8 0.9  Sodium 136 - 145 mmol/L 140 141 143  Potassium 3.5 - 5.1 mmol/L 4.4 4.2 4.5  Chloride 98 - 109 mmol/L 106 - -  CO2 22 - 29 mmol/L '27 27 28  ' Calcium 8.4 - 10.4 mg/dL 9.6 9.4 9.8  Total Protein 6.4 - 8.3 g/dL 6.9 7.0 6.9  Total Bilirubin 0.2 - 1.2 mg/dL 0.4 0.22 0.42  Alkaline Phos 40 - 150 U/L 183(H) 163(H) 162(H)  AST 5 - 34 U/L '20 15 31  ' ALT 0 - 55 U/L 33 31 63(H)     PATHOLOGY REPORT  Diagnosis 04/14/17 Ovaries and fallopian tubes, bilateral - BENIGN OVARIES WITH INCLUSION CYST. - BENIGN FALLOPIAN TUBES WITH PARATUBAL CYSTS.  Diagnosis 09/06/2016 1. Breast, left, needle core biopsy, 3 o'clock - INVASIVE DUCTAL CARCINOMA. - DUCTAL CARCINOMA IN SITU.  - SEE COMMENT. 2. Lymph node, axilla and upper limb, left -  METASTATIC CARCINOMA IN 1 OF 1 LYMPH NODE (1/1), WITH EXTRACAPSULAR EXTENSION. Microscopic Comment 1. The carcinoma appears grade 2. A breast prognostic profile will be performed and the results reported separately. The results were called to [facility] on 09/07/16. (JBK:gt, 09/07/16) 1. PROGNOSTIC INDICATORS Results: IMMUNOHISTOCHEMICAL AND MORPHOMETRIC ANALYSIS PERFORMED MANUALLY Estrogen Receptor: 100%, POSITIVE, STRONG STAINING INTENSITY Progesterone Receptor: 100%, POSITIVE, STRONG STAINING INTENSITY Proliferation Marker Ki67: 5%  Results: HER2 - NEGATIVE RATIO OF HER2/CEP17 SIGNALS 1.46 AVERAGE HER2 COPY NUMBER  PER CELL 3.00  Diagnosis 10/05/2016 1. Breast, lumpectomy, Left - INVASIVE AND IN SITU DUCTAL CARCINOMA, 1 CM. - MARGINS NOT INVOLVED. - FIBROCYSTIC CHANGES. 2. Lymph node, sentinel, biopsy, Left #1 - METASTATIC CARCINOMA IN ONE LYMPH NODE WITH EXTRACAPSULAR EXTENSION (1/1). 3. Lymph node, sentinel, biopsy, Left #2 - METASTATIC CARCINOMA IN ONE LYMPH NODE WITH EXTRACAPSULAR EXTENSION (1/1). Microscopic Comment 1. BREAST, INVASIVE TUMOR, WITH LYMPH NODES PRESENT Specimen, including laterality and lymph node sampling (sentinel, non-sentinel): Left breast with two sentinel lymph nodes Procedure: Localized lumpectomy and sentinel lymph node biopsies. Histologic type: Ductal Grade: 1 Tubule formation: 1 Nuclear pleomorphism: 2 Mitotic: 1 Tumor size (gross measurement) 1 cm Margins: Free of tumor Invasive, distance to closest margin: 0.3 cm from anterior margin In-situ, distance to closest margin: 0.2 cm from anterior margin If margin positive, focally or broadly: N/A Lymphovascular invasion: Present Ductal carcinoma in situ: Present Grade: Low grade Extensive intraductal component: No Lobular neoplasia: No Tumor focality: Unifocal Extent of tumor: 1 of 3 FINAL for Gloria Smith, Gloria Smith ([id number]) Microscopic Comment(continued) Skin: N/A Nipple: N/A Skeletal muscle: N/A Lymph nodes: Examined: 2 Sentinel 0 Non-sentinel 2 Total Lymph nodes with metastasis: 2 Isolated tumor cells (< 0.2 mm): 0 Micrometastasis: (> 0.2 mm and < 2.0 mm): 0 Macrometastasis: (> 2.0 mm): 2 Extracapsular extension: Present, both nodes Breast prognostic profile: Case [id number] Estrogen receptor: 100% and 95%, positive Progesterone receptor: 100% and 100%, positive Her 2 neu: Negative, ratio is 1.46 and 1.45 Ki-67: 5% and 10% Non-neoplastic breast: Fibrocystic changes TNM: pT1b, pN1a, pMX (JDP:kh 10-06-16) Claudette Laws MD Pathologist, Electronic Signature (Case signed  10/06/2016)   Mammaprint 09/06/2016      RADIOGRAPHIC STUDIES: I have personally reviewed the radiological images as listed and agreed with the findings in the report. No results found.  [place] Abd 09/27/17  IMPRESSION: 1. Small echogenic focus in the anterior right lobe of liver with diminished echogenicity peripherally. Possible atypical hemangioma but a metastatic deposit cannot be excluded. Consider CT or MRI of the abdomen to assess further. 2. No gallstones. 3. Portions of the head and tail of the pancreas are obscured by bowel gas.  MRI Abd W WO Contrast 10/14/17  IMPRESSION: 1. 8 by 4 mm lesion with signal and enhancement characteristics most typical for a small hemangioma in segment IVb of the liver. No other candidate lesion to correspond with the sonographic finding. No MRI findings of suspicion for hepatic metastatic disease. 2. Small left adrenal adenoma. 3. Scattered descending colon diverticula.   Diagnostic Mammogram at [place] 09/05/17  IMPRESSION:  There is no mammographic evidnece of malignancy. Routine mammographic evaluation in 1 year is recommeneded.    Bone Density at [facility] 09/05/17  IMPRESSION  Based on the WHO criteris this patient has BONE DENSITY WITHIN NORMAL LIMITS   ASSESSMENT & PLAN:  47 y.o. pre-or perimenopausal woman, presented with screening discovered left breast mass and axilla adenopathy.  1. Breast cancer of upper-outer quadrant of left female breast, pT1bN1aM0  stage IIA, G1, ER+/PR+/HER2-, Ki67 5% -I previously discussed her surgical pathology results with the patient in details. -She has had a complete surgical resections, margins were negative, she had 2 positive sentinel lymph nodes with extracapsular extension, (+) LVI which are risk for recurrence.  -We did mammaprint on her initial biopsy tumor tissue, which showed low risk disease, luminal type A. Her risk of recurrence in 10 years without adjuvant therapy is 10%. Her 5 year  distant metastasis free survival is 97.8% with adjuvant antiestrogen therapy. Based on the mammaprint results, I do not recommend adjuvant chemotherapy. -She has started exemestane with ovarian suppression, now status post BSO. She is tolerating exemestane well, we'll continue for 7 years -Due to the tenderness in the right upper quadrant, and elevated alkaline phosphatase, I obtained an ultrasound of abdomen and liver MRI scans. -We disucssed her 09/2017 abdominal MRI and [place], these were only remarkable except for a small liver hemangioma.  -She is clinically doing well, lab results reviewed, exam was unremarkable, no clinical concern for recurrence. -Continue breast cancer surveillance. Mammogram and Bone density Scan was done at [facility] 08/2017 were normal.  -Slightly elevated Alkaline Phosphatase level in the past 3 months, overall mild and stable, GGT was normal. I will continue monitoring the lab, if alkaline phosphatase at increase this further, I will obtain a bone scan to rule out bony metastasis. -She continues to have elevated platelets (431 today, 03/24/18) but improved. She does not have any chronic illnesses, we will continue to monitor this closely with repeat labs.  -She stopped exemestane in March 2019, when she lost her insurance. -I will switch her to letrozole 2.5 mg daily, I gave her bottle of sample today.  I strongly encouraged her to continue antiestrogen therapy due to risk of recurrence.   2. Genetics  -Giving her young age and positive family history of breast cancer, she'll be referred to genetic counselor for genetic testing, to ruled out inheritable breast cancer syndrome. -Negative for known pathogenic mutations within any of 20 genes on the Breast/Ovarian Cancer Panel through [employer].  One variant of uncertain significance (VUS) called "c.3571A>G (p.Ile1191Val)" was found in one copy of the BRIP1 gene.  The Breast/Ovarian Cancer Panel offered by [facility] Hope Pigeon, MD) includes sequencing and deletion/duplication analysis for the following 19 genes:  ATM, BARD1, BRCA1, BRCA2, BRIP1, CDH1, CHEK2, FANCC, MLH1, MSH2, MSH6, NBN, PALB2, PMS2, PTEN, RAD51C, RAD51D, TP53, and XRCC2.  This panel also includes deletion/duplication analysis (without sequencing) for one gene, EPCAM.   3. Hot flash  - secondary to BSO and exemestane -much better now   4. Bone Health -Normal DEXA in 08/2017 -We discussed menopause and exemestane weaks her bone.    PLAN -She will start letrozole 2.5 mg daily.  If she has side effects from letrozole, will switch back to exemestane when she has new insurance. -lab and f/u in 4 months  -Mammogram in 08/2018   All questions were answered. The patient knows to call the clinic with any problems, questions or concerns.  I spent 20 minutes counseling the patient face to face. The total time spent in the appointment was 25 minutes and more than 50% was on counseling.    Truitt Merle, MD 03/24/2018   This document serves as a record of services personally performed by Truitt Merle, MD. It was created on her behalf by Margit Banda, a trained medical scribe. The creation of this record is based on the scribe's personal observations and the  provider's statements to them.    I have reviewed the above documentation for accuracy and completeness, and I agree with the above.

## 2018-03-24 ENCOUNTER — Telehealth: Payer: Self-pay | Admitting: Hematology

## 2018-03-24 ENCOUNTER — Encounter: Payer: Self-pay | Admitting: Pharmacist

## 2018-03-24 ENCOUNTER — Inpatient Hospital Stay: Payer: Self-pay | Attending: Hematology | Admitting: Hematology

## 2018-03-24 ENCOUNTER — Encounter: Payer: Self-pay | Admitting: Hematology

## 2018-03-24 ENCOUNTER — Inpatient Hospital Stay: Payer: Self-pay

## 2018-03-24 VITALS — BP 114/67 | HR 71 | Temp 97.8°F | Resp 14 | Ht 64.0 in | Wt 180.4 lb

## 2018-03-24 DIAGNOSIS — Z79899 Other long term (current) drug therapy: Secondary | ICD-10-CM | POA: Insufficient documentation

## 2018-03-24 DIAGNOSIS — Z170 Estrogen receptor positive status [ER+]: Secondary | ICD-10-CM | POA: Insufficient documentation

## 2018-03-24 DIAGNOSIS — C779 Secondary and unspecified malignant neoplasm of lymph node, unspecified: Secondary | ICD-10-CM | POA: Insufficient documentation

## 2018-03-24 DIAGNOSIS — R748 Abnormal levels of other serum enzymes: Secondary | ICD-10-CM | POA: Insufficient documentation

## 2018-03-24 DIAGNOSIS — C50412 Malignant neoplasm of upper-outer quadrant of left female breast: Secondary | ICD-10-CM

## 2018-03-24 DIAGNOSIS — Z923 Personal history of irradiation: Secondary | ICD-10-CM | POA: Insufficient documentation

## 2018-03-24 DIAGNOSIS — D1803 Hemangioma of intra-abdominal structures: Secondary | ICD-10-CM | POA: Insufficient documentation

## 2018-03-24 LAB — COMPREHENSIVE METABOLIC PANEL
ALT: 33 U/L (ref 0–55)
AST: 20 U/L (ref 5–34)
Albumin: 3.7 g/dL (ref 3.5–5.0)
Alkaline Phosphatase: 183 U/L — ABNORMAL HIGH (ref 40–150)
Anion gap: 7 (ref 3–11)
BUN: 15 mg/dL (ref 7–26)
CHLORIDE: 106 mmol/L (ref 98–109)
CO2: 27 mmol/L (ref 22–29)
CREATININE: 0.82 mg/dL (ref 0.60–1.10)
Calcium: 9.6 mg/dL (ref 8.4–10.4)
GFR calc Af Amer: >60 mL/min (ref >60)
GFR calc non Af Amer: >60 mL/min (ref >60)
Glucose, Bld: 100 mg/dL (ref 70–140)
POTASSIUM: 4.4 mmol/L (ref 3.5–5.1)
SODIUM: 140 mmol/L (ref 136–145)
Total Bilirubin: 0.4 mg/dL (ref 0.2–1.2)
Total Protein: 6.9 g/dL (ref 6.4–8.3)

## 2018-03-24 LAB — CBC WITH DIFFERENTIAL/PLATELET
Basophils Absolute: 0 K/uL (ref 0.0–0.1)
Basophils Relative: 0 %
Eosinophils Absolute: 0.2 K/uL (ref 0.0–0.5)
Eosinophils Relative: 3 %
HCT: 40.7 % (ref 34.8–46.6)
Hemoglobin: 13.6 g/dL (ref 11.6–15.9)
Lymphocytes Relative: 27 %
Lymphs Abs: 2.2 K/uL (ref 0.9–3.3)
MCH: 28.6 pg (ref 25.1–34.0)
MCHC: 33.4 g/dL (ref 31.5–36.0)
MCV: 85.7 fL (ref 79.5–101.0)
Monocytes Absolute: 0.7 K/uL (ref 0.1–0.9)
Monocytes Relative: 9 %
Neutro Abs: 4.9 K/uL (ref 1.5–6.5)
Neutrophils Relative %: 61 %
Platelets: 431 K/uL — ABNORMAL HIGH (ref 145–400)
RBC: 4.75 MIL/uL (ref 3.70–5.45)
RDW: 13.9 % (ref 11.2–14.5)
WBC: 8 K/uL (ref 3.9–10.3)

## 2018-03-24 NOTE — Progress Notes (Signed)
Oral Chemotherapy Pharmacist Encounter  Dispensed samples to patient:  Medication: Femara letrozole) 2.5mg  tablets Instructions: Take 1 tablet (2.5mg ) by mouth once daily Quantity dispensed: 150 Days supply: 150 Manufacturer: [employer] Lot: [id number]  Exp: Sept 30, 2019  Johny Drilling, PharmD, BCPS, BCOP 03/24/2018 9:22 AM Oral Oncology Clinic [phone]

## 2018-03-24 NOTE — Telephone Encounter (Signed)
Scheduled appt per 5/3 los - Gave patient AVS and calender per los. Faxed order to [facility]

## 2018-07-31 ENCOUNTER — Ambulatory Visit: Payer: Self-pay | Admitting: Hematology

## 2018-07-31 ENCOUNTER — Other Ambulatory Visit: Payer: Self-pay

## 2018-10-12 ENCOUNTER — Encounter: Payer: Self-pay | Admitting: *Deleted

## 2019-08-20 ENCOUNTER — Telehealth: Payer: Self-pay | Admitting: Hematology

## 2019-08-20 NOTE — Telephone Encounter (Signed)
Returned patient's phone call regarding rescheduling missed 09/09 appointment, per patient's request appointment has been moved to 10/05.

## 2019-08-21 NOTE — Progress Notes (Signed)
Order for diagnostic mammogram with ultrasound if needed faxed to [facility] [phone]. Received confirmation that fax went through successfully.

## 2019-08-26 NOTE — Progress Notes (Addendum)
[place]   [phone] [phone]   Clinic Follow up Note   Patient Care Team: Aloha Gell, MD as PCP - General (Obstetrics and Gynecology) Kyung Rudd, MD as Consulting Physician (Radiation Oncology) Truitt Merle, MD as Consulting Physician (Hematology) Rolm Bookbinder, MD as Consulting Physician (General Surgery) Gardenia Phlegm, NP as Nurse Practitioner (Hematology and Oncology) 08/27/2019  CHIEF COMPLAINT: f/u left breast cancer    SUMMARY OF ONCOLOGIC HISTORY: Oncology History Overview Note  Breast cancer of upper-outer quadrant of left female breast [facility])   Staging form: Breast, AJCC 7th Edition   - Clinical stage from 09/06/2016: Stage IIA (T1b, N1, M0) - Signed by Truitt Merle, MD on 09/15/2016   - Pathologic stage from 10/05/2016: Stage IIA (T1b, N1a, cM0) - Signed by Truitt Merle, MD on 10/24/2016    Breast cancer of upper-outer quadrant of left female breast ([place])  09/06/2016 Initial Diagnosis   Malignant neoplasm of upper-outer quadrant of right female breast ([place])   09/06/2016 Initial Biopsy   Left breast 3:00 position showed invasive ductal carcinoma and DCIS, left axillary lymph node biopsy showed metastatic carcinoma with extracapsular extension   09/06/2016 Receptors her2   Breast tumor ER 100% positive, PR 100% positive, HER-2 negative, Ki-67 5%   09/06/2016 Mammogram   Diagnostic mammogram and ultrasound showed a 1.0 cm mass at the 3 clock position of left breast, and the enlarged left axillary node with cortical thickening. The 76m cm oval cyst in the right breast is likely benign.   09/06/2016 Miscellaneous   Mammaprint showed low risk luminal A type, with average 10 year risk of recurrence untreated 10%.   09/16/2016 Genetic Testing   Genetic testing was normal, and did not reveal a deleterious mutation  One variant of uncertain significance (VUS) was found in the BRIP1 gene.  Genes tested include: ATM, BARD1,  BRCA1, BRCA2, BRIP1, CDH1, CHEK2, FANCC, MLH1, MSH2, MSH6, NBN, PALB2, PMS2, PTEN, RAD51C, RAD51D, TP53, and XRCC2.  This panel also includes deletion/duplication analysis (without sequencing) for one gene, EPCAM.     10/05/2016 Surgery   Left breast lumpectomy and SLN biopsy    10/05/2016 Pathology Results   Left breast lumpectomy showed invasive and in situ ductal carcinoma, 1 cm, grade 1, margins are negative, 2 sentinel lymph nodes are positive for metastatic carcinoma with extracapsular extension. LVI (+)   11/08/2016 - 12/24/2016 Radiation Therapy   Adjuvant radiation (Dr. MTobe Sos The Left breast was treated to 50.4 Gy in 28 fractions of 1.8 Gy per fraction. 2. The Left axilla was treated to 50.4 Gy in 28 fractions of 1.8 Gy per fraction.  3. The Left breast was boosted to 10 Gy in 5 fractions of 2 Gy per fraction.     01/10/2017 -  Anti-estrogen oral therapy   Exemestane 216mdaily   04/14/2017 Surgery   LAPAROSCOPIC BILATERAL SALPINGO OOPHORECTOMY by Dr. FoPamala Hurry 09/27/2017 Imaging    [facility] 09/27/17  IMPRESSION: 1. Small echogenic focus in the anterior right lobe of liver with diminished echogenicity peripherally. Possible atypical hemangioma but a metastatic deposit cannot be excluded. Consider CT or MRI of the abdomen to assess further. 2. No gallstones. 3. Portions of the head and tail of the pancreas are obscured by bowel gas.   10/14/2017 Imaging   MRI Abd W WO Contrast 10/14/17  IMPRESSION: 1. 8 by 4 mm lesion with signal and enhancement characteristics most typical for a small hemangioma in segment IVb of the liver.  No other candidate lesion to correspond with the sonographic finding. No MRI findings of suspicion for hepatic metastatic disease. 2. Small left adrenal adenoma. 3. Scattered descending colon diverticula.     CURRENT THERAPY: Exemestane 25 mg Daily started 01/10/17, pt stopped in mid March 2019 due to insurance issue. Started letrozole in  03/2018  INTERVAL HISTORY: Ms. Redel returns for f/u as scheduled. She was last seen on 03/24/2018. She had a mammogram in 09/2018 that was negative. 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 She is tearful, feeling bad on herself for not calling sooner for f/u. She ran out of letrozole early 2020 and did not call for refill. She thinks she tolerated exemestane better than letrozole. Had occasional body ache and hot flash while on AI but manageable. Has intermittent sharpe pains in her left breast, no nipple discharge or inversion. No new breast mass. Has a mammogram scheduled tomorrow at [facility]. In general, she lacks motivation, has poor diet, little to no exercise. She is not sleeping well. She has gained weight which leads to poor body image and intimacy issues on her part. She has a partner who supports and loves her. She has social isolation due to working from home. She is stoic because of her cultural upbringing, but feels it's time to change things. Denies SI/HI. Denies new bone or body pain. Irregular BM for the last year, no blood in stool. Denies cough, chest pain, dyspnea.    MEDICAL HISTORY:  Past Medical History:  Diagnosis Date   Arthritis    Headache    ocular migraines - very rare   Malignant neoplasm of upper-outer quadrant of right female breast ([place]) 09/10/2016   pt states that cancer is on left breast, not right   Urinary frequency     SURGICAL HISTORY: Past Surgical History:  Procedure Laterality Date   BREAST LUMPECTOMY WITH RADIOACTIVE SEED AND SENTINEL LYMPH NODE BIOPSY Left 10/05/2016   Procedure: LEFT BREAST RADIOACTIVE SEED GUIDED LUMPECTOMY, LEFT AXILLARY RADIOACTIVE SEED GUIDED NODE EXCISION, LEFT AXILLARY SENTINEL LYMPH NODE BIOPSY(LEFT TARGETED AXILLARY DISSECTION);  Surgeon: Rolm Bookbinder, MD;   Location: [place];  Service: General;  Laterality: Left;   DENTAL SURGERY     DILATION AND CURETTAGE OF UTERUS     LAPAROSCOPIC BILATERAL SALPINGO OOPHERECTOMY Bilateral 04/14/2017   Procedure: LAPAROSCOPIC BILATERAL SALPINGO OOPHORECTOMY;  Surgeon: Aloha Gell, MD;  Location: [facility];  Service: Gynecology;  Laterality: Bilateral;   right knee surgery at age of 6       I have reviewed the social history and family history with the patient and they are unchanged from previous note.  ALLERGIES:  has No Known Allergies.  MEDICATIONS:  Current Outpatient Medications  Medication Sig Dispense Refill   exemestane (AROMASIN) 25 MG tablet Take 1 tablet (25 mg total) by mouth daily after breakfast. 90 tablet 3   oxyCODONE-acetaminophen (ROXICET) 5-325 MG tablet Take 1-2 tablets by mouth every 4 (four) hours as needed for severe pain. 20 tablet 0   PARoxetine (PAXIL) 20 MG tablet Take 1 tablet (20 mg total) by mouth daily. 30 tablet 1   No current facility-administered medications for this visit.     PHYSICAL EXAMINATION: ECOG PERFORMANCE STATUS: 0 - Asymptomatic  Vitals:   08/27/19 0906  BP: 124/69  Pulse: 84  Resp: 16  Temp: 98.3 F (  36.8 C)  SpO2: 100%   Filed Weights   08/27/19 0906  Weight: 194 lb 11.2 oz (88.3 kg)    GENERAL:alert, no distress and comfortable SKIN: 3 small raised erythematous lesions to right breast and sternal area EYES:  sclera clear LYMPH:  no palpable cervical or supraclavicular lymphadenopathy LUNGS: clear with normal breathing effort HEART: regular rate & rhythm, no lower extremity edema ABDOMEN:abdomen soft, non-tender and normal bowel sounds Musculoskeletal:no cyanosis of digits  NEURO: alert & oriented x 3 with fluent speech, normal gait Breast exam: s//p left lumpectomy. Surgical incision has completely healed. Left breast with mild skin thickening secondary to radiation. No palpable mass in either breast or axilla that I could  appreciate   LABORATORY DATA:  I have reviewed the data as listed CBC Latest Ref Rng & Units 08/27/2019 03/24/2018 11/24/2017  WBC 4.0 - 10.5 K/uL 9.0 8.0 8.4  Hemoglobin 12.0 - 15.0 g/dL 14.1 13.6 14.0  Hematocrit 36.0 - 46.0 % 44.0 40.7 42.9  Platelets 150 - 400 K/uL 466(H) 431(H) 479(H)     CMP Latest Ref Rng & Units 08/27/2019 03/24/2018 11/24/2017  Glucose 70 - 99 mg/dL 103(H) 100 82  BUN 6 - 20 mg/dL 15 15 14.7  Creatinine 0.44 - 1.00 mg/dL 0.88 0.82 0.8  Sodium 135 - 145 mmol/L 140 140 141  Potassium 3.5 - 5.1 mmol/L 4.8 4.4 4.2  Chloride 98 - 111 mmol/L 107 106 -  CO2 22 - 32 mmol/L _0 Calcium 8.9 - 10.3 mg/dL 9.4 9.6 9.4  Total Protein 6.5 - 8.1 g/dL 7.2 6.9 7.0  Total Bilirubin 0.3 - 1.2 mg/dL <0.2(L) 0.4 0.22  Alkaline Phos 38 - 126 U/L 152(H) 183(H) 163(H)  AST 15 - 41 U/L _1 ALT 0 - 44 U/L 27 33 31      RADIOGRAPHIC STUDIES: I have personally reviewed the radiological images as listed and agreed with the findings in the report. No results found.   ASSESSMENT & PLAN: 48 y.o. woman, presented with screening discovered left breast mass and axilla adenopathy.  1. Breast cancer of upper-outer quadrant of left female breast, pT1bN1aM0 stage IIA, G1, ER+/PR+/HER2-, Ki67 5%; mammaprint low risk luminal type A -diagnosed in 08/2016, s/p lumpectomy with a complete surgical resection, margins were negative, she had 2 positive sentinel lymph nodes with extracapsular extension, (+) LVI which are risk for recurrence.  -Mammaprint showed low risk disease, luminal type A. Her risk of recurrence in 10 years without adjuvant therapy is 10%. Her 5 year distant metastasis free survival is 97.8% with adjuvant antiestrogen therapy. -adjuvant chemotherapy was not recommended based on this  -s/p adjuvant radiation to reduce risk of local recurrence  -She started exemestane with ovarian suppression, s/p BSO. She tolerated exemestane well with mild joint ache and hot flash that  improved on paxil. Dr. Burr Medico recommended to continue 7 years -Due to high copay she was switched to letrozole in 03/2018, after 6 month supply ran out, she did not call for refill  -She had a negative mammogram in 09/2018, and is scheduled tomorrow at [facility]. Will request report.  -breast exam is unremarkable today, CBC and CMP are stable with thrombocytosis and elevated Alk phos. Previous liver imaging showed hemangioma.  -No clinical concern for recurrence -She agrees to restart AI today to reduce her risk of recurrence, she prefers exemestane. 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 She declined referral for financial assistance. However, she agrees that  if copay is too high, she will call me and will change back to letrozole -proceed with mammogram on 10/6 at [facility] -f/u in 3-4 months to monitor her closely   2. Depression  -she presents today with tearfulness, lacking motivation, weight gain, poor sleep, negative body and intimacy issues. She admits to having depression -Denies SI/HI -she was previously on Paxil for hot flashes which helped vasomotor symptoms. She agrees to restart in hopes this will help her depression as well.  -She declined SW referral today -Has strong family support, son and partner   3. Genetics  -Giving her young age and positive family history of breast cancer, she'll be referred to genetic counselor for genetic testing, to ruled out inheritable breast cancer syndrome. -Negative for known pathogenic mutations within any of 20 genes on the Breast/Ovarian Cancer Panel through [employer].  One variant of uncertain significance (VUS) called "[id number] (p.[id number])" was found in one copy of the BRIP1 gene.    4. Hot flash  - secondary to BSO and exemestane -mild, tolerable on AI and improved off treatment -improved on Paxil historically, she agrees to restart. Will monitor closely.  5. Bone Health -Normal DEXA in 08/2017 -We discussed  menopause and exemestane weaks her bone.  -She is overwhelmed and tearful today, admits to having difficulty taking care of herself this year. She is due for repeat DEXA but will defer this for a few months.   6. Health maintenance -she is requesting referral to a new PCP -I encouraged her to continue annual health f/u and routine labs -she will try to start healthy diet and exercise plan.  -I reminded her that we recommend screening colonoscopy at age 30, she understands   PLAN: -Labs reviewed, PLT and ALK phos are stable. Continue monitoring -Continue surveillance -Mammogram at [facility] 10/6 -Restart Exemestane, if copay is too expensive she will call and will refill letrozole -Was on paxil in the past which helped with hot flashes, will restart for depression -Lab, f/u with me in 3-4 months to monitor depression and AE from exemestane  -Refer to new PCP for routine health and wellness  -Patient declined SW and financial assistance referral today   Orders Placed This Encounter  Procedures   Ambulatory referral to Internal Medicine    Referral Priority:   Routine    Referral Type:   Consultation    Referral Reason:   Specialty Services Required    Requested Specialty:   Internal Medicine    Number of Visits Requested:   1   All questions were answered. The patient knows to call the clinic with any problems, questions or concerns. No barriers to learning was detected. I spent 30 minutes counseling the patient face to face. The total time spent in the appointment was 40 minutes and more than 50% was on counseling and review of test results     Alla Feeling, NP 08/27/19

## 2019-08-27 ENCOUNTER — Inpatient Hospital Stay (HOSPITAL_BASED_OUTPATIENT_CLINIC_OR_DEPARTMENT_OTHER): Payer: 59 | Admitting: Nurse Practitioner

## 2019-08-27 ENCOUNTER — Other Ambulatory Visit: Payer: Self-pay

## 2019-08-27 ENCOUNTER — Inpatient Hospital Stay: Payer: 59 | Attending: Hematology

## 2019-08-27 ENCOUNTER — Encounter: Payer: Self-pay | Admitting: Nurse Practitioner

## 2019-08-27 VITALS — BP 124/69 | HR 84 | Temp 98.3°F | Resp 16 | Wt 194.7 lb

## 2019-08-27 DIAGNOSIS — Z79811 Long term (current) use of aromatase inhibitors: Secondary | ICD-10-CM | POA: Insufficient documentation

## 2019-08-27 DIAGNOSIS — Z170 Estrogen receptor positive status [ER+]: Secondary | ICD-10-CM | POA: Diagnosis not present

## 2019-08-27 DIAGNOSIS — R748 Abnormal levels of other serum enzymes: Secondary | ICD-10-CM | POA: Insufficient documentation

## 2019-08-27 DIAGNOSIS — C50412 Malignant neoplasm of upper-outer quadrant of left female breast: Secondary | ICD-10-CM | POA: Insufficient documentation

## 2019-08-27 DIAGNOSIS — R7989 Other specified abnormal findings of blood chemistry: Secondary | ICD-10-CM | POA: Insufficient documentation

## 2019-08-27 DIAGNOSIS — F329 Major depressive disorder, single episode, unspecified: Secondary | ICD-10-CM | POA: Insufficient documentation

## 2019-08-27 LAB — COMPREHENSIVE METABOLIC PANEL
ALT: 27 U/L (ref 0–44)
AST: 16 U/L (ref 15–41)
Albumin: 3.9 g/dL (ref 3.5–5.0)
Alkaline Phosphatase: 152 U/L — ABNORMAL HIGH (ref 38–126)
Anion gap: 7 (ref 5–15)
BUN: 15 mg/dL (ref 6–20)
CO2: 26 mmol/L (ref 22–32)
Calcium: 9.4 mg/dL (ref 8.9–10.3)
Chloride: 107 mmol/L (ref 98–111)
Creatinine, Ser: 0.88 mg/dL (ref 0.44–1.00)
GFR calc Af Amer: >60 mL/min (ref >60)
GFR calc non Af Amer: >60 mL/min (ref >60)
Glucose, Bld: 103 mg/dL — ABNORMAL HIGH (ref 70–99)
Potassium: 4.8 mmol/L (ref 3.5–5.1)
Sodium: 140 mmol/L (ref 135–145)
Total Bilirubin: <0.2 mg/dL — ABNORMAL LOW (ref 0.3–1.2)
Total Protein: 7.2 g/dL (ref 6.5–8.1)

## 2019-08-27 LAB — CBC WITH DIFFERENTIAL/PLATELET
Abs Immature Granulocytes: 0.01 10*3/uL (ref 0.00–0.07)
Basophils Absolute: 0.1 10*3/uL (ref 0.0–0.1)
Basophils Relative: 1 %
Eosinophils Absolute: 0.2 10*3/uL (ref 0.0–0.5)
Eosinophils Relative: 3 %
HCT: 44 % (ref 36.0–46.0)
Hemoglobin: 14.1 g/dL (ref 12.0–15.0)
Immature Granulocytes: 0 %
Lymphocytes Relative: 27 %
Lymphs Abs: 2.4 10*3/uL (ref 0.7–4.0)
MCH: 28.5 pg (ref 26.0–34.0)
MCHC: 32 g/dL (ref 30.0–36.0)
MCV: 88.9 fL (ref 80.0–100.0)
Monocytes Absolute: 0.6 10*3/uL (ref 0.1–1.0)
Monocytes Relative: 7 %
Neutro Abs: 5.5 10*3/uL (ref 1.7–7.7)
Neutrophils Relative %: 62 %
Platelets: 466 10*3/uL — ABNORMAL HIGH (ref 150–400)
RBC: 4.95 MIL/uL (ref 3.87–5.11)
RDW: 13.2 % (ref 11.5–15.5)
WBC: 9 10*3/uL (ref 4.0–10.5)
nRBC: 0 % (ref 0.0–0.2)

## 2019-08-27 MED ORDER — PAROXETINE HCL 20 MG PO TABS: 20.0000 mg | ORAL_TABLET | Freq: Every day | ORAL | 1 refills | Status: DC

## 2019-08-27 MED ORDER — EXEMESTANE 25 MG PO TABS: 25.0000 mg | ORAL_TABLET | Freq: Every day | ORAL | 3 refills | Status: DC

## 2019-08-28 ENCOUNTER — Telehealth: Payer: Self-pay | Admitting: Nurse Practitioner

## 2019-08-28 NOTE — Telephone Encounter (Signed)
Scheduled appt per 10/5 los.  Spoke with pt and she is aware of her appt date and time.

## 2019-09-04 ENCOUNTER — Encounter: Payer: Self-pay | Admitting: Family Medicine

## 2019-09-04 ENCOUNTER — Other Ambulatory Visit: Payer: Self-pay

## 2019-09-04 ENCOUNTER — Ambulatory Visit (INDEPENDENT_AMBULATORY_CARE_PROVIDER_SITE_OTHER): Payer: 59 | Admitting: Family Medicine

## 2019-09-04 VITALS — BP 110/70 | HR 69 | Temp 97.6°F | Resp 12 | Ht 64.0 in | Wt 194.4 lb

## 2019-09-04 DIAGNOSIS — Z23 Encounter for immunization: Secondary | ICD-10-CM | POA: Diagnosis not present

## 2019-09-04 DIAGNOSIS — Z13220 Encounter for screening for lipoid disorders: Secondary | ICD-10-CM

## 2019-09-04 DIAGNOSIS — Z13228 Encounter for screening for other metabolic disorders: Secondary | ICD-10-CM | POA: Diagnosis not present

## 2019-09-04 DIAGNOSIS — Z130 Encounter for screening for diseases of the blood and blood-forming organs and certain disorders involving the immune mechanism: Secondary | ICD-10-CM | POA: Diagnosis not present

## 2019-09-04 DIAGNOSIS — Z0000 Encounter for general adult medical examination without abnormal findings: Secondary | ICD-10-CM

## 2019-09-04 DIAGNOSIS — Z1329 Encounter for screening for other suspected endocrine disorder: Secondary | ICD-10-CM | POA: Diagnosis not present

## 2019-09-04 DIAGNOSIS — C773 Secondary and unspecified malignant neoplasm of axilla and upper limb lymph nodes: Secondary | ICD-10-CM | POA: Diagnosis not present

## 2019-09-04 LAB — LIPID PANEL
Cholesterol: 209 mg/dL — ABNORMAL HIGH (ref 0–200)
HDL: 55.3 mg/dL (ref >39.00)
LDL Cholesterol: 137 mg/dL — ABNORMAL HIGH (ref 0–99)
NonHDL: 153.89
Total CHOL/HDL Ratio: 4
Triglycerides: 85 mg/dL (ref 0.0–149.0)
VLDL: 17 mg/dL (ref 0.0–40.0)

## 2019-09-04 LAB — HEMOGLOBIN A1C: Hgb A1c MFr Bld: 5.9 % (ref 4.6–6.5)

## 2019-09-04 NOTE — Patient Instructions (Signed)
A few things to remember from today's visit:   Routine general medical examination at a health care facility  Screening for lipoid disorders - Plan: Lipid panel  Screening for endocrine, metabolic and immunity disorder - Plan: Hemoglobin A1c  Today you have you routine preventive visit.  At least 150 minutes of moderate exercise per week, daily brisk walking for 15-30 min is a good exercise option. Healthy diet low in saturated (animal) fats and sweets and consisting of fresh fruits and vegetables, lean meats such as fish and white chicken and whole grains.  These are some of recommendations for screening depending of age and risk factors:   - Vaccines:  Tdap vaccine every 10 years.  Shingles vaccine recommended at age 51, could be given after 49 years of age but not sure about insurance coverage.   Pneumonia vaccines:  Pneumovax at 88.  Sometimes Pneumovax is giving earlier if history of smoking, lung disease,diabetes,kidney disease among some.  Screening for diabetes at age 58 and every 3 years.  Cervical cancer prevention:  Pap smear starts at 49 years of age and continues periodically until 49 years old in low risk women. Pap smear every 3 years between 80 and 32 years old. Pap smear every 3-5 years between women 18 and older if pap smear negative and HPV screening negative.   -Breast cancer: Mammogram: There is disagreement between experts about when to start screening in low risk asymptomatic female but recent recommendations are to start screening at 59 and not later than 49 years old , every 1-2 years and after 49 yo q 2 years. Screening is recommended until 49 years old but some women can continue screening depending of healthy issues.   Colon cancer screening: starts at 49 years old until 49 years old.  Cholesterol disorder screening at age 77 and every 3 years.  Also recommended:  1. Dental visit- Brush and floss your teeth twice daily; visit your dentist twice a  year. 2. Eye doctor- Get an eye exam at least every 2 years. 3. Helmet use- Always wear a helmet when riding a bicycle, motorcycle, rollerblading or skateboarding. 4. Safe sex- If you may be exposed to sexually transmitted infections, use a condom. 5. Seat belts- Seat belts can save your live; always wear one. 6. Smoke/Carbon Monoxide detectors- These detectors need to be installed on the appropriate level of your home. Replace batteries at least once a year. 7. Skin cancer- When out in the sun please cover up and use sunscreen 15 SPF or higher. 8. Violence- If anyone is threatening or hurting you, please tell your healthcare provider.  9. Drink alcohol in moderation- Limit alcohol intake to one drink or less per day. Never drink and drive.

## 2019-09-04 NOTE — Progress Notes (Signed)
HPI:   Gloria Smith is a 48 y.o. female, who is here today to establish care.  Former PCP: Neta Mends Last preventive routine visit: 04/2017.  Chronic medical problems: Breast cancer,she follows with oncologist. Dx'ed 08/2016 Stage II A. BRCA negative. S/P left lumpectomy in 09/2016. Radiation therapy January 05, 2017.  She is on Exemestane 25 mg daily and s/p oophorectomy.  Depression: Dx'ed last week,placed on Paroxetine 20 mg daily. Negative for depressed mood. She is tolerating medication well.  Concerns today: None. She is due for CPE, agrees with having it today.  She lives with his boyfriend. She walks her dog daily for about a mile. She has not been consistent with following a healthful diet.  Mammogram: 08/28/2019. Tdap vaccine in 06-Jan-2016.   Review of Systems  Constitutional: Negative for appetite change, fatigue and fever.  HENT: Negative for hearing loss, mouth sores, sore throat and trouble swallowing.   Eyes: Negative for redness and visual disturbance.  Respiratory: Negative for cough, shortness of breath and wheezing.   Cardiovascular: Negative for chest pain and leg swelling.  Gastrointestinal: Negative for abdominal pain, nausea and vomiting.       No changes in bowel habits.  Endocrine: Negative for cold intolerance, heat intolerance, polydipsia, polyphagia and polyuria.  Genitourinary: Negative for decreased urine volume, dysuria, hematuria, vaginal bleeding and vaginal discharge.  Musculoskeletal: Negative for arthralgias, back pain and neck pain.  Skin: Negative for color change and rash.  Allergic/Immunologic: Positive for environmental allergies.  Neurological: Negative for syncope, weakness and headaches.  Hematological: Negative for adenopathy. Does not bruise/bleed easily.  Psychiatric/Behavioral: Negative for confusion and sleep disturbance.  All other systems reviewed and are negative.   Current Outpatient Medications on File Prior  to Visit  Medication Sig Dispense Refill  . exemestane (AROMASIN) 25 MG tablet Take 1 tablet (25 mg total) by mouth daily after breakfast. 90 tablet 3  . PARoxetine (PAXIL) 20 MG tablet Take 1 tablet (20 mg total) by mouth daily. 30 tablet 1   No current facility-administered medications on file prior to visit.      Past Medical History:  Diagnosis Date  . Arthritis   . Depression   . Headache    ocular migraines - very rare  . Malignant neoplasm of upper-outer quadrant of right female breast ([place]) 09/10/2016   pt states that cancer is on left breast, not right  . Urinary frequency    No Known Allergies  Family History  Problem Relation Age of Onset  . Colon cancer Paternal Grandmother 95       d. 9  . Cancer Paternal Grandmother   . Breast cancer Cousin        paternal 1st cousin, once-removed; dx. 26s  . Other Mother        hx precancerous skin finding removed from eyelid at 70-71  . Arthritis Mother   . Hypertension Father   . Heart attack Maternal Grandmother 01-05-2069  . Early death Maternal Grandmother   . Heart disease Maternal Grandmother   . Stroke Maternal Grandfather   . Heart attack Paternal Grandfather 67  . Early death Paternal Grandfather   . Other Other 25       hx of precancerous finding on abnormal pap smear  . Cancer Other        maternal great aunt (MGM's sister) d. early 5s; spinal cancer, but this may have been a secondary cancer w/ NOS primary  . Cancer Other  maternal great grandmother (MGM's mother) d. 97s w/ NOS cancer  . Cancer Other        paternal great uncle (PGM's brother) d. 74s w/ NOS cancer    Social History   Socioeconomic History  . Marital status: Single    Spouse name: Not on file  . Number of children: 1  . Years of education: Not on file  . Highest education level: Not on file  Occupational History  . Not on file  Social Needs  . Financial resource strain: Not on file  . Food insecurity    Worry: Not on file     Inability: Not on file  . Transportation needs    Medical: Not on file    Non-medical: Not on file  Tobacco Use  . Smoking status: Former Smoker    Packs/day: 0.50    Years: 30.00    Pack years: 15.00    Quit date: 08/22/2016    Years since quitting: 3.0  . Smokeless tobacco: Never Used  Substance and Sexual Activity  . Alcohol use: Yes    Alcohol/week: 1.0 standard drinks    Types: 1 Glasses of wine per week    Comment: 6 drinks wine or beer a week   . Drug use: No  . Sexual activity: Yes    Birth control/protection: Injection  Lifestyle  . Physical activity    Days per week: 3 days    Minutes per session: Not on file  . Stress: Not on file  Relationships  . Social Herbalist on phone: Not on file    Gets together: Not on file    Attends religious service: Not on file    Active member of club or organization: Not on file    Attends meetings of clubs or organizations: Not on file    Relationship status: Not on file  Other Topics Concern  . Not on file  Social History Narrative  . Not on file    Vitals:   09/04/19 0842  BP: 110/70  Pulse: 69  Resp: 12  Temp: 97.6 F (36.4 C)  SpO2: 95%    Body mass index is 33.36 kg/m.   Physical Exam  Nursing note and vitals reviewed. Constitutional: She is oriented to person, place, and time. She appears well-developed. No distress.  HENT:  Head: Normocephalic and atraumatic.  Right Ear: Hearing, tympanic membrane, external ear and ear canal normal.  Left Ear: Hearing, tympanic membrane, external ear and ear canal normal.  Mouth/Throat: Oropharynx is clear and moist and mucous membranes are normal. She has dentures.  Absent uvula.  Eyes: Pupils are equal, round, and reactive to light. Conjunctivae and EOM are normal.  Neck: No tracheal deviation present. No thyromegaly present.  Cardiovascular: Normal rate and regular rhythm.  No murmur heard. Pulses:      Dorsalis pedis pulses are 2+ on the right side and  2+ on the left side.  Respiratory: Effort normal and breath sounds normal. No respiratory distress.  GI: Soft. She exhibits no mass. There is no hepatomegaly. There is no abdominal tenderness.  Genitourinary:    Genitourinary Comments: Deferred to gyn.   Musculoskeletal:        General: No edema.     Comments: No major deformity or signs of synovitis appreciated.  Lymphadenopathy:    She has no cervical adenopathy.       Right: No supraclavicular adenopathy present.       Left: No supraclavicular adenopathy  present.  Neurological: She is alert and oriented to person, place, and time. She has normal strength. No cranial nerve deficit. Coordination and gait normal.  Reflex Scores:      Bicep reflexes are 2+ on the right side and 2+ on the left side.      Patellar reflexes are 2+ on the right side and 2+ on the left side. Skin: Skin is warm. No rash noted. No erythema.  Psychiatric: She has a normal mood and affect. Cognition and memory are normal.  Well groomed, good eye contact.    ASSESSMENT AND PLAN:  Ms. Tifani was seen today for establish care and annual exam.  Diagnoses and all orders for this visit:  Lab Results  Component Value Date   CHOL 209 (H) 09/04/2019   HDL 55.30 09/04/2019   LDLCALC 137 (H) 09/04/2019   TRIG 85.0 09/04/2019   CHOLHDL 4 09/04/2019   Lab Results  Component Value Date   HGBA1C 5.9 09/04/2019    Routine general medical examination at a health care facility We discussed the importance of regular physical activity and healthy diet for prevention of chronic illness and/or complications. Preventive guidelines reviewed. Vaccination up to date. Continue following with gyn for female preventive care. Next CPE in a year.  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     Betty G. Martinique, MD  [facility]. [place] office.

## 2019-09-06 ENCOUNTER — Encounter: Payer: Self-pay | Admitting: Family Medicine

## 2019-09-06 DIAGNOSIS — C773 Secondary and unspecified malignant neoplasm of axilla and upper limb lymph nodes: Secondary | ICD-10-CM | POA: Insufficient documentation

## 2019-09-13 ENCOUNTER — Encounter: Payer: Self-pay | Admitting: Nurse Practitioner

## 2019-09-17 ENCOUNTER — Encounter: Payer: Self-pay | Admitting: Nurse Practitioner

## 2019-10-25 IMAGING — MR MR ABDOMEN WO/W CM
10 of 18 series · 22 of 48 positions shown · IV contrast (multihance)
Comparison: Ultrasound dated 09/27/2017

CLINICAL DATA: Echogenic liver lesion for further characterization.
Left breast cancer.

EXAM:
MRI ABDOMEN WITHOUT AND WITH CONTRAST
TECHNIQUE: Multiplanar multisequence MR imaging of the abdomen was performed
both before and after the administration of intravenous contrast.
CONTRAST:  18 cc MultiHance

[Series 3: T2 fat-sat · axial · 5.0mm · 0.78mm/px · z∈[-95,+140]mm · 3 of 48 slices shown]
[im 1/48]
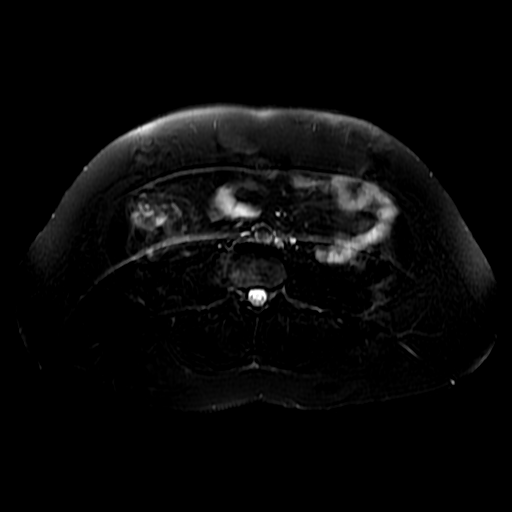
[im 24/48]
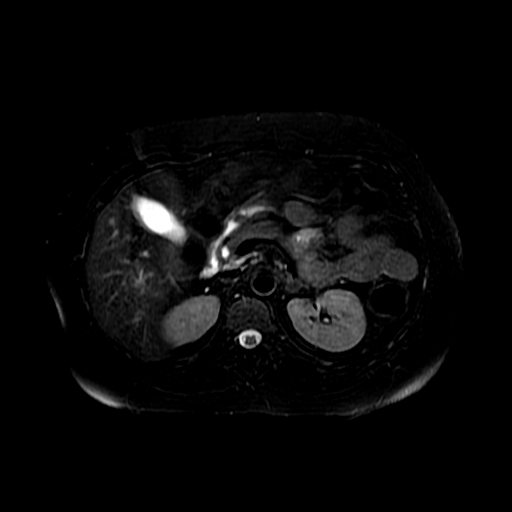
[im 48/48]
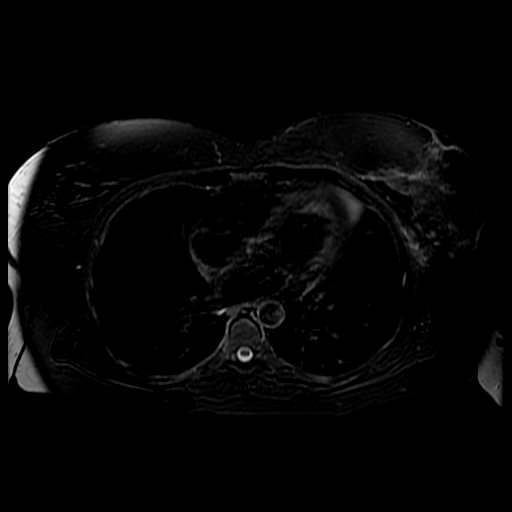

[Series 4: DWI b500 · axial · 6.0mm · 1.48mm/px · z∈[-89,+138]mm · 3 of 60 slices shown]
[im 1/60]
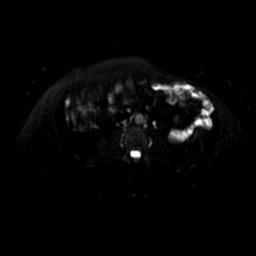
[im 30/60]
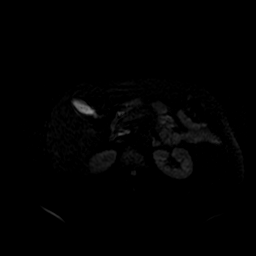
[im 60/60]
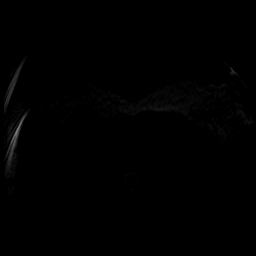

[Series 6: ax dualecho · axial · 5.0mm · 0.78mm/px · z∈[-109,+116]mm · 3 of 92 slices shown]
[im 1/92]
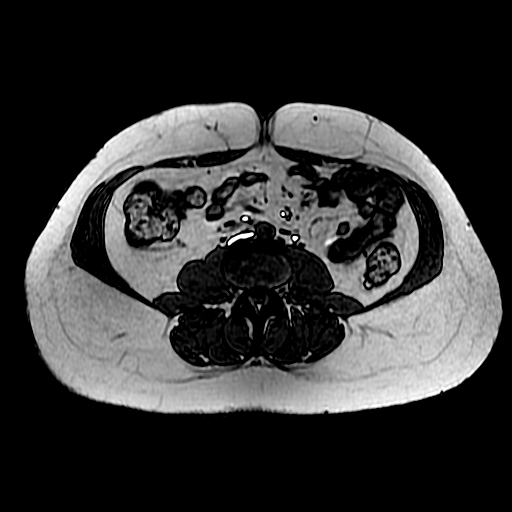
[im 46/92]
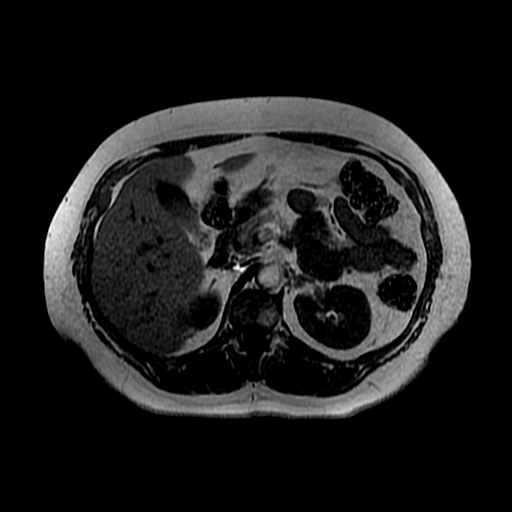
[im 92/92]
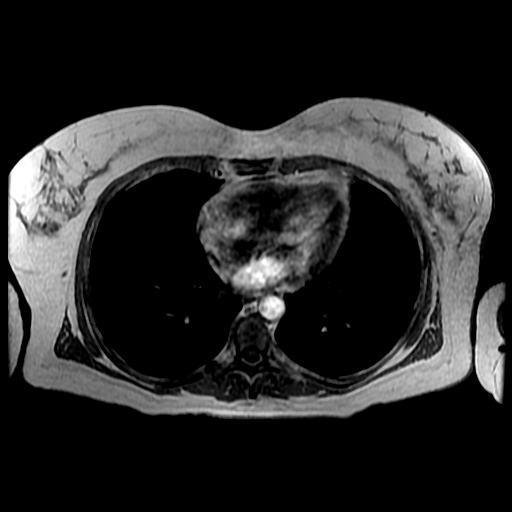

[Series 7: T2 · axial · 5.0mm · 0.78mm/px · z∈[-95,+130]mm · 2 of 46 slices shown (1 of 2)]
[im 1/46]
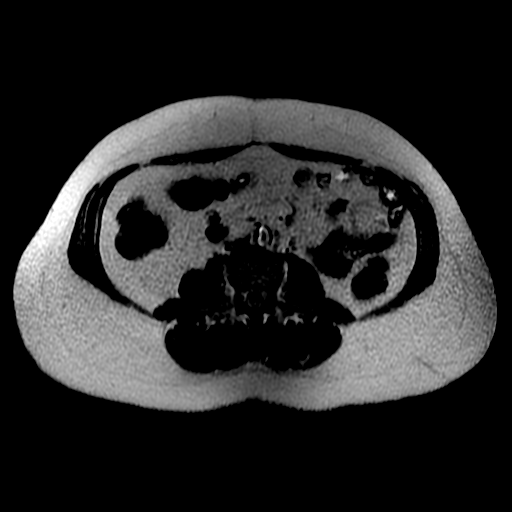
[im 46/46]
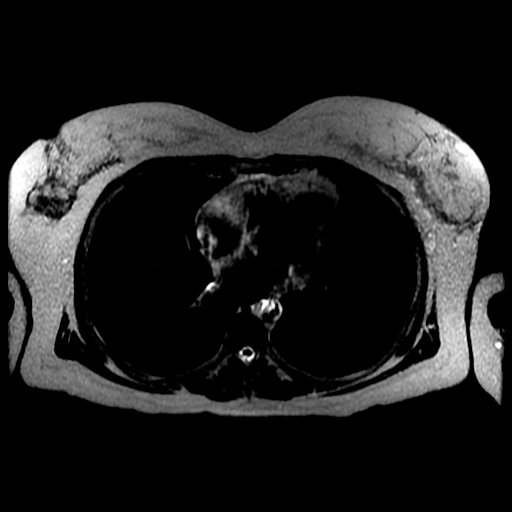

[Series 8: T2 · coronal · 5.0mm · 0.78mm/px · 1 of 42 slices shown (2 of 2)]
[im 1/42]
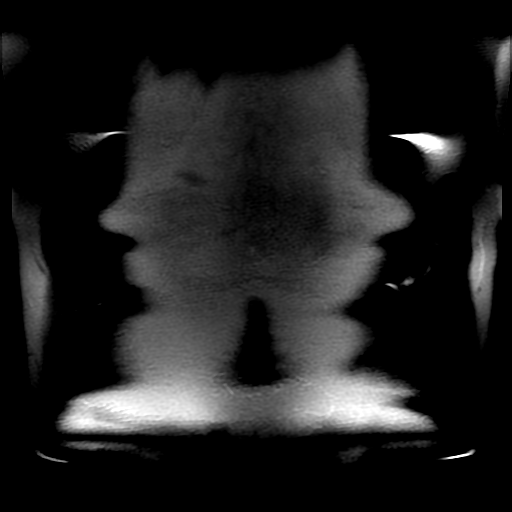

[Series 9: bSSFP · axial · 5.0mm · 0.78mm/px · z∈[-92,+133]mm · 2 of 46 slices shown]
[im 1/46]
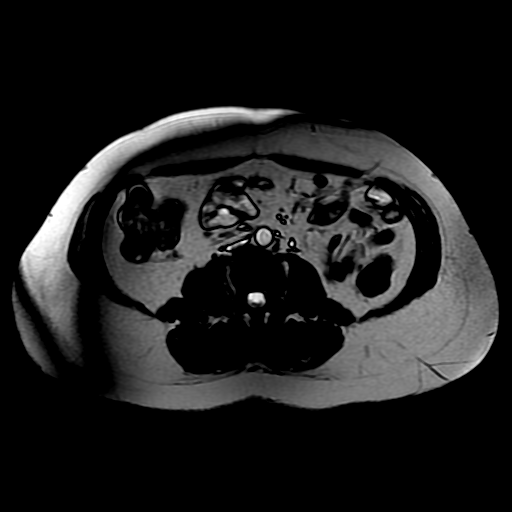
[im 46/46]
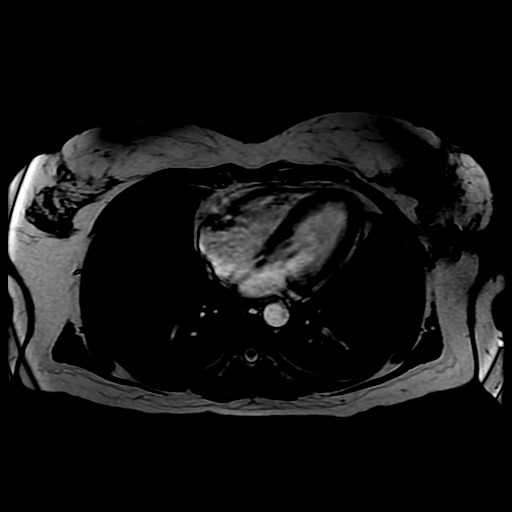

[Series 400: DWI · axial · 6.0mm · 1.48mm/px · 1 of 30 slices shown]
[im 1/30]
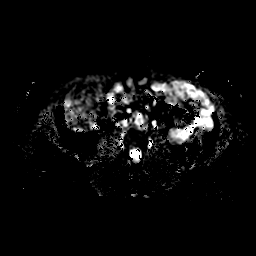

[Series 1000: T1 dynamic · axial · 5.0mm · 0.78mm/px · z∈[-116,+122]mm · 3 of 96 slices shown (1 of 3)]
[im 1/96]
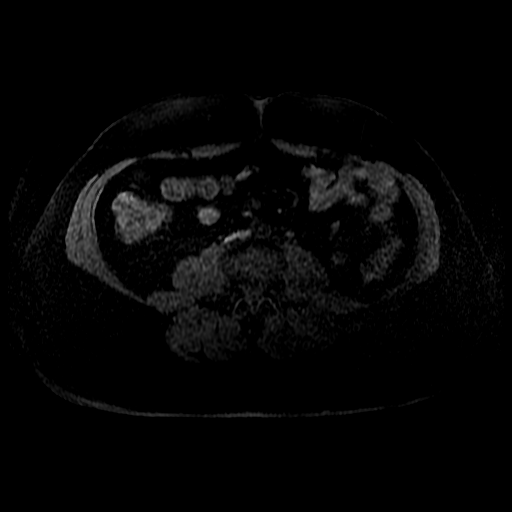
[im 48/96]
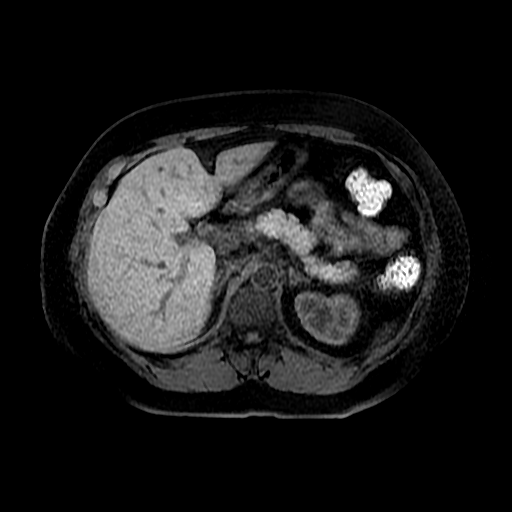
[im 96/96]
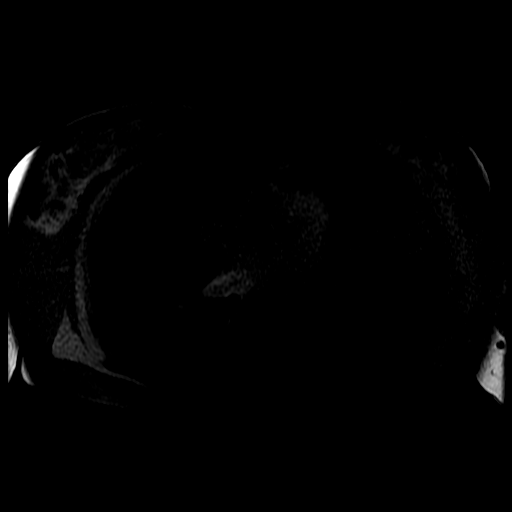

[Series 1001: T1 dynamic · axial · 5.0mm · 0.78mm/px · z∈[-116,+122]mm · 3 of 96 slices shown (2 of 3)]
[im 1/96]
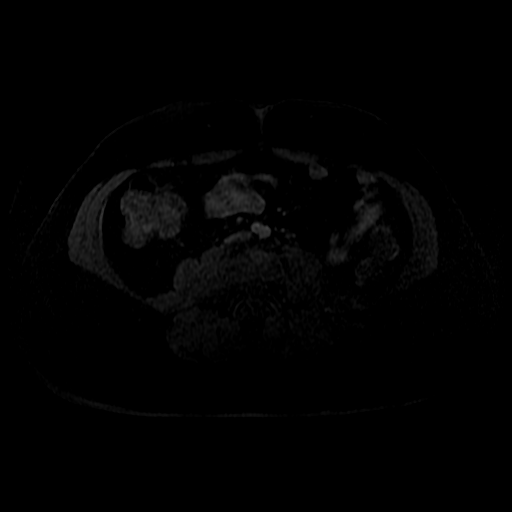
[im 48/96]
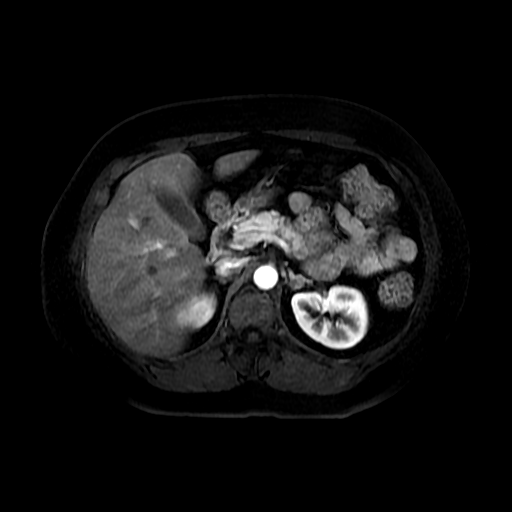
[im 96/96]
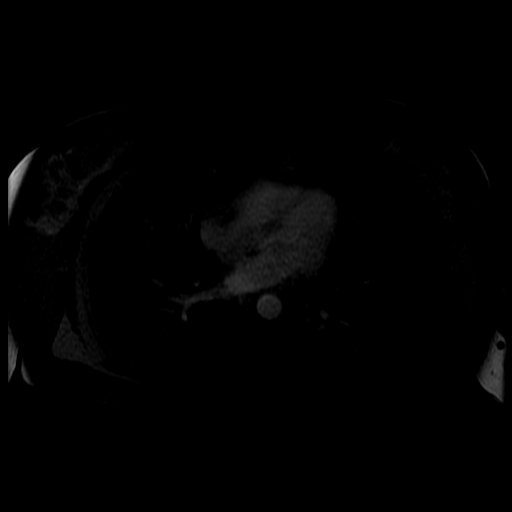

[Series 1002: T1 dynamic · axial · 5.0mm · 0.78mm/px · 1 of 96 slices shown (3 of 3)]
[im 1/96]
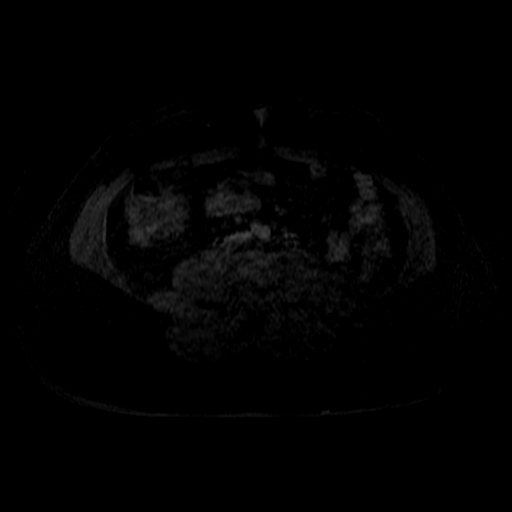

[22 of 48 positions shown; findings below may reference images not displayed]

FINDINGS: Lower chest: Unremarkable

Hepatobiliary: The lesion of concern in segment IVb of the liver
measures 0.8 by 0.4 cm on image [id number] and demonstrates high T2
signal, low precontrast T1 signal, and progressive peripheral
filling and reduce conspicuity on later phase images fairly
characteristic for a small hemangioma. No other liver lesion is
identified to correspond with the sonographic finding.

Pancreas:  Unremarkable

Spleen:  Unremarkable

Adrenals/Urinary Tract: 1.0 by 0.9 cm left adrenal lesion with
considerable signal dropout on out of phase images compared
compatible with a small adrenal adenoma.

Stomach/Bowel: Scattered descending colon diverticula.

Vascular/Lymphatic:  Unremarkable

Other:  No supplemental non-categorized findings.

Musculoskeletal: Unremarkable
IMPRESSION: 1. 8 by 4 mm lesion with signal and enhancement characteristics most
typical for a small hemangioma in segment IVb of the liver. No other
candidate lesion to correspond with the sonographic finding. No MRI
findings of suspicion for hepatic metastatic disease.
2. Small left adrenal adenoma.
3. Scattered descending colon diverticula.

## 2019-11-13 ENCOUNTER — Other Ambulatory Visit: Payer: Self-pay | Admitting: Nurse Practitioner

## 2019-11-13 DIAGNOSIS — C50412 Malignant neoplasm of upper-outer quadrant of left female breast: Secondary | ICD-10-CM

## 2019-11-13 DIAGNOSIS — Z170 Estrogen receptor positive status [ER+]: Secondary | ICD-10-CM

## 2019-12-04 NOTE — Progress Notes (Deleted)
[facility]   [phone] [phone]   Clinic Follow up Note   Patient Care Team: Martinique, Betty G, MD as PCP - General (Family Medicine) Kyung Rudd, MD as Consulting Physician (Radiation Oncology) Truitt Merle, MD as Consulting Physician (Hematology) Rolm Bookbinder, MD as Consulting Physician (General Surgery) Gardenia Phlegm, NP as Nurse Practitioner (Hematology and Oncology) 12/04/2019  CHIEF COMPLAINT: F/u left breast cancer   SUMMARY OF ONCOLOGIC HISTORY: Oncology History Overview Note  Breast cancer of upper-outer quadrant of left female breast [facility])   Staging form: Breast, AJCC 7th Edition   - Clinical stage from 09/06/2016: Stage IIA (T1b, N1, M0) - Signed by Truitt Merle, MD on 09/15/2016   - Pathologic stage from 10/05/2016: Stage IIA (T1b, N1a, cM0) - Signed by Truitt Merle, MD on 10/24/2016    Breast cancer of upper-outer quadrant of left female breast ([place])  09/06/2016 Initial Diagnosis   Malignant neoplasm of upper-outer quadrant of right female breast ([place])   09/06/2016 Initial Biopsy   Left breast 3:00 position showed invasive ductal carcinoma and DCIS, left axillary lymph node biopsy showed metastatic carcinoma with extracapsular extension   09/06/2016 Receptors her2   Breast tumor ER 100% positive, PR 100% positive, HER-2 negative, Ki-67 5%   09/06/2016 Mammogram   Diagnostic mammogram and ultrasound showed a 1.0 cm mass at the 3 clock position of left breast, and the enlarged left axillary node with cortical thickening. The 12m cm oval cyst in the right breast is likely benign.   09/06/2016 Miscellaneous   Mammaprint showed low risk luminal A type, with average 10 year risk of recurrence untreated 10%.   09/16/2016 Genetic Testing   Genetic testing was normal, and did not reveal a deleterious mutation  One variant of uncertain significance (VUS) was found in the BRIP1 gene.  Genes tested include: ATM, BARD1, BRCA1, BRCA2,  BRIP1, CDH1, CHEK2, FANCC, MLH1, MSH2, MSH6, NBN, PALB2, PMS2, PTEN, RAD51C, RAD51D, TP53, and XRCC2.  This panel also includes deletion/duplication analysis (without sequencing) for one gene, EPCAM.     10/05/2016 Surgery   Left breast lumpectomy and SLN biopsy    10/05/2016 Pathology Results   Left breast lumpectomy showed invasive and in situ ductal carcinoma, 1 cm, grade 1, margins are negative, 2 sentinel lymph nodes are positive for metastatic carcinoma with extracapsular extension. LVI (+)   11/08/2016 - 12/24/2016 Radiation Therapy   Adjuvant radiation (Dr. MTobe Sos The Left breast was treated to 50.4 Gy in 28 fractions of 1.8 Gy per fraction. 2. The Left axilla was treated to 50.4 Gy in 28 fractions of 1.8 Gy per fraction.  3. The Left breast was boosted to 10 Gy in 5 fractions of 2 Gy per fraction.     01/10/2017 -  Anti-estrogen oral therapy   Exemestane '25mg'$  daily   04/14/2017 Surgery   LAPAROSCOPIC BILATERAL SALPINGO OOPHORECTOMY by Dr. FPamala Hurry  09/27/2017 Imaging    UKoreaAbd 09/27/17  IMPRESSION: 1. Small echogenic focus in the anterior right lobe of liver with diminished echogenicity peripherally. Possible atypical hemangioma but a metastatic deposit cannot be excluded. Consider CT or MRI of the abdomen to assess further. 2. No gallstones. 3. Portions of the head and tail of the pancreas are obscured by bowel gas.   10/14/2017 Imaging   MRI Abd W WO Contrast 10/14/17  IMPRESSION: 1. 8 by 4 mm lesion with signal and enhancement characteristics most typical for a small hemangioma in segment IVb of the liver. No  other candidate lesion to correspond with the sonographic finding. No MRI findings of suspicion for hepatic metastatic disease. 2. Small left adrenal adenoma. 3. Scattered descending colon diverticula.     CURRENT THERAPY: AI with Exemestane, started 08/2019   INTERVAL HISTORY: Ms. Dollinger returns for f/u as scheduled. She was last seen by me on 08/27/19.     REVIEW OF SYSTEMS:   Constitutional: Denies fevers, chills or abnormal weight loss Eyes: Denies blurriness of vision Ears, nose, mouth, throat, and face: Denies mucositis or sore throat Respiratory: Denies cough, dyspnea or wheezes Cardiovascular: Denies palpitation, chest discomfort or lower extremity swelling Gastrointestinal:  Denies nausea, heartburn or change in bowel habits Skin: Denies abnormal skin rashes Lymphatics: Denies new lymphadenopathy or easy bruising Neurological:Denies numbness, tingling or new weaknesses Behavioral/Psych: Mood is stable, no new changes  All other systems were reviewed with the patient and are negative.  MEDICAL HISTORY:  Past Medical History:  Diagnosis Date  . Arthritis   . Depression   . Headache    ocular migraines - very rare  . Malignant neoplasm of upper-outer quadrant of right female breast ([place]) 09/10/2016   pt states that cancer is on left breast, not right  . Urinary frequency     SURGICAL HISTORY: Past Surgical History:  Procedure Laterality Date  . BREAST LUMPECTOMY WITH RADIOACTIVE SEED AND SENTINEL LYMPH NODE BIOPSY Left 10/05/2016   Procedure: LEFT BREAST RADIOACTIVE SEED GUIDED LUMPECTOMY, LEFT AXILLARY RADIOACTIVE SEED GUIDED NODE EXCISION, LEFT AXILLARY SENTINEL LYMPH NODE BIOPSY(LEFT TARGETED AXILLARY DISSECTION);  Surgeon: Rolm Bookbinder, MD;  Location: [facility];  Service: General;  Laterality: Left;  . DENTAL SURGERY    . DILATION AND CURETTAGE OF UTERUS    . LAPAROSCOPIC BILATERAL SALPINGO OOPHERECTOMY Bilateral 04/14/2017   Procedure: LAPAROSCOPIC BILATERAL SALPINGO OOPHORECTOMY;  Surgeon: Aloha Gell, MD;  Location: [facility];  Service: Gynecology;  Laterality: Bilateral;  . right knee surgery at age of 67       I have reviewed the social history and family history with the patient and they are unchanged from previous note.  ALLERGIES:  has No Known Allergies.  MEDICATIONS:  Current Outpatient Medications   Medication Sig Dispense Refill  . exemestane (AROMASIN) 25 MG tablet Take 1 tablet (25 mg total) by mouth daily after breakfast. 90 tablet 3  . PARoxetine (PAXIL) 20 MG tablet TAKE 1 TABLET BY MOUTH DAILY 30 tablet 1   No current facility-administered medications for this visit.    PHYSICAL EXAMINATION: ECOG PERFORMANCE STATUS: {CHL ONC ECOG [phone]}  There were no vitals filed for this visit. There were no vitals filed for this visit.  GENERAL:alert, no distress and comfortable SKIN: skin color, texture, turgor are normal, no rashes or significant lesions EYES: normal, Conjunctiva are pink and non-injected, sclera clear OROPHARYNX:no exudate, no erythema and lips, buccal mucosa, and tongue normal  NECK: supple, thyroid normal size, non-tender, without nodularity LYMPH:  no palpable lymphadenopathy in the cervical, axillary or inguinal LUNGS: clear to auscultation and percussion with normal breathing effort HEART: regular rate & rhythm and no murmurs and no lower extremity edema ABDOMEN:abdomen soft, non-tender and normal bowel sounds Musculoskeletal:no cyanosis of digits and no clubbing  NEURO: alert & oriented x 3 with fluent speech, no focal motor/sensory deficits  LABORATORY DATA:  I have reviewed the data as listed CBC Latest Ref Rng & Units 08/27/2019 03/24/2018 11/24/2017  WBC 4.0 - 10.5 K/uL 9.0 8.0 8.4  Hemoglobin 12.0 - 15.0 g/dL 14.1 13.6 14.0  Hematocrit  36.0 - 46.0 % 44.0 40.7 42.9  Platelets 150 - 400 K/uL 466(H) 431(H) 479(H)     CMP Latest Ref Rng & Units 08/27/2019 03/24/2018 11/24/2017  Glucose 70 - 99 mg/dL 103(H) 100 82  BUN 6 - 20 mg/dL 15 15 14.7  Creatinine 0.44 - 1.00 mg/dL 0.88 0.82 0.8  Sodium 135 - 145 mmol/L 140 140 141  Potassium 3.5 - 5.1 mmol/L 4.8 4.4 4.2  Chloride 98 - 111 mmol/L 107 106 -  CO2 22 - 32 mmol/L '26 27 27  '$ Calcium 8.9 - 10.3 mg/dL 9.4 9.6 9.4  Total Protein 6.5 - 8.1 g/dL 7.2 6.9 7.0  Total Bilirubin 0.3 - 1.2 mg/dL <0.2(L) 0.4  0.22  Alkaline Phos 38 - 126 U/L 152(H) 183(H) 163(H)  AST 15 - 41 U/L '16 20 15  '$ ALT 0 - 44 U/L 27 33 31      RADIOGRAPHIC STUDIES: I have personally reviewed the radiological images as listed and agreed with the findings in the report. No results found.   ASSESSMENT & PLAN:  No problem-specific Assessment & Plan notes found for this encounter.   No orders of the defined types were placed in this encounter.  All questions were answered. The patient knows to call the clinic with any problems, questions or concerns. No barriers to learning was detected. I spent {CHL ONC TIME VISIT - UTMLY:[id number]} counseling the patient face to face. The total time spent in the appointment was {CHL ONC TIME VISIT - LEXNT:[id number]} and more than 50% was on counseling and review of test results     Alla Feeling, NP 12/04/19

## 2019-12-05 ENCOUNTER — Inpatient Hospital Stay: Payer: 59 | Attending: Hematology

## 2019-12-05 ENCOUNTER — Inpatient Hospital Stay: Payer: 59 | Admitting: Nurse Practitioner

## 2019-12-05 ENCOUNTER — Encounter: Payer: Self-pay | Admitting: Nurse Practitioner

## 2020-02-11 ENCOUNTER — Other Ambulatory Visit: Payer: Self-pay | Admitting: Nurse Practitioner

## 2020-02-11 DIAGNOSIS — C50412 Malignant neoplasm of upper-outer quadrant of left female breast: Secondary | ICD-10-CM

## 2020-02-19 NOTE — Telephone Encounter (Signed)
OK to refill

## 2020-04-27 ENCOUNTER — Other Ambulatory Visit: Payer: Self-pay | Admitting: Nurse Practitioner

## 2020-04-27 DIAGNOSIS — Z170 Estrogen receptor positive status [ER+]: Secondary | ICD-10-CM

## 2020-04-29 ENCOUNTER — Telehealth: Payer: Self-pay | Admitting: Nurse Practitioner

## 2020-04-29 NOTE — Telephone Encounter (Signed)
Scheduled appt per 6/8 sch message - pt aware of appt

## 2020-04-30 ENCOUNTER — Telehealth: Payer: Self-pay

## 2020-04-30 NOTE — Telephone Encounter (Signed)
TC to pt per Gloria Rue NP to let her know that we received her refill request for her Paroxetine ( Paxil) medication. However Gloria Smith  Needs to have an office visit with her first before refilling medication. I let her know that scheduling would be reaching out to her to set up a date and time. Patient stated that scheduling has already reached out and she is scheduled for an appointment on 6/29.

## 2020-05-09 ENCOUNTER — Other Ambulatory Visit: Payer: Self-pay | Admitting: Emergency Medicine

## 2020-05-09 DIAGNOSIS — C50412 Malignant neoplasm of upper-outer quadrant of left female breast: Secondary | ICD-10-CM

## 2020-05-20 ENCOUNTER — Encounter: Payer: Self-pay | Admitting: Nurse Practitioner

## 2020-05-20 ENCOUNTER — Inpatient Hospital Stay: Payer: 59

## 2020-05-20 ENCOUNTER — Other Ambulatory Visit: Payer: Self-pay

## 2020-05-20 ENCOUNTER — Inpatient Hospital Stay: Payer: 59 | Attending: Nurse Practitioner | Admitting: Nurse Practitioner

## 2020-05-20 VITALS — BP 101/68 | HR 86 | Temp 97.9°F | Resp 17 | Ht 64.0 in | Wt 194.0 lb

## 2020-05-20 DIAGNOSIS — C50412 Malignant neoplasm of upper-outer quadrant of left female breast: Secondary | ICD-10-CM | POA: Insufficient documentation

## 2020-05-20 DIAGNOSIS — Z79811 Long term (current) use of aromatase inhibitors: Secondary | ICD-10-CM | POA: Diagnosis not present

## 2020-05-20 DIAGNOSIS — D75839 Thrombocytosis, unspecified: Secondary | ICD-10-CM

## 2020-05-20 DIAGNOSIS — F329 Major depressive disorder, single episode, unspecified: Secondary | ICD-10-CM | POA: Diagnosis not present

## 2020-05-20 DIAGNOSIS — Z170 Estrogen receptor positive status [ER+]: Secondary | ICD-10-CM

## 2020-05-20 DIAGNOSIS — D751 Secondary polycythemia: Secondary | ICD-10-CM | POA: Diagnosis not present

## 2020-05-20 DIAGNOSIS — D473 Essential (hemorrhagic) thrombocythemia: Secondary | ICD-10-CM | POA: Diagnosis not present

## 2020-05-20 DIAGNOSIS — N644 Mastodynia: Secondary | ICD-10-CM | POA: Diagnosis not present

## 2020-05-20 DIAGNOSIS — E2839 Other primary ovarian failure: Secondary | ICD-10-CM | POA: Diagnosis not present

## 2020-05-20 DIAGNOSIS — N951 Menopausal and female climacteric states: Secondary | ICD-10-CM | POA: Insufficient documentation

## 2020-05-20 LAB — CBC WITH DIFFERENTIAL/PLATELET
Abs Immature Granulocytes: 0.03 10*3/uL (ref 0.00–0.07)
Basophils Absolute: 0.1 10*3/uL (ref 0.0–0.1)
Basophils Relative: 1 %
Eosinophils Absolute: 0.3 10*3/uL (ref 0.0–0.5)
Eosinophils Relative: 3 %
HCT: 46.8 % — ABNORMAL HIGH (ref 36.0–46.0)
Hemoglobin: 14.7 g/dL (ref 12.0–15.0)
Immature Granulocytes: 0 %
Lymphocytes Relative: 23 %
Lymphs Abs: 2.2 10*3/uL (ref 0.7–4.0)
MCH: 27.9 pg (ref 26.0–34.0)
MCHC: 31.4 g/dL (ref 30.0–36.0)
MCV: 89 fL (ref 80.0–100.0)
Monocytes Absolute: 0.6 10*3/uL (ref 0.1–1.0)
Monocytes Relative: 7 %
Neutro Abs: 6.1 10*3/uL (ref 1.7–7.7)
Neutrophils Relative %: 66 %
Platelets: 522 10*3/uL — ABNORMAL HIGH (ref 150–400)
RBC: 5.26 MIL/uL — ABNORMAL HIGH (ref 3.87–5.11)
RDW: 13.4 % (ref 11.5–15.5)
WBC: 9.3 10*3/uL (ref 4.0–10.5)
nRBC: 0 % (ref 0.0–0.2)

## 2020-05-20 LAB — COMPREHENSIVE METABOLIC PANEL
ALT: 31 U/L (ref 0–44)
AST: 15 U/L (ref 15–41)
Albumin: 3.9 g/dL (ref 3.5–5.0)
Alkaline Phosphatase: 162 U/L — ABNORMAL HIGH (ref 38–126)
Anion gap: 7 (ref 5–15)
BUN: 18 mg/dL (ref 6–20)
CO2: 25 mmol/L (ref 22–32)
Calcium: 9.4 mg/dL (ref 8.9–10.3)
Chloride: 107 mmol/L (ref 98–111)
Creatinine, Ser: 0.99 mg/dL (ref 0.44–1.00)
GFR calc Af Amer: >60 mL/min (ref >60)
GFR calc non Af Amer: >60 mL/min (ref >60)
Glucose, Bld: 117 mg/dL — ABNORMAL HIGH (ref 70–99)
Potassium: 4.6 mmol/L (ref 3.5–5.1)
Sodium: 139 mmol/L (ref 135–145)
Total Bilirubin: 0.2 mg/dL — ABNORMAL LOW (ref 0.3–1.2)
Total Protein: 7.7 g/dL (ref 6.5–8.1)

## 2020-05-20 MED ORDER — EXEMESTANE 25 MG PO TABS: 25.0000 mg | ORAL_TABLET | Freq: Every day | ORAL | 3 refills | Status: DC

## 2020-05-20 MED ORDER — PAROXETINE HCL 20 MG PO TABS: 20.0000 mg | ORAL_TABLET | Freq: Every day | ORAL | 2 refills | Status: DC

## 2020-05-20 NOTE — Progress Notes (Signed)
[facility]   [phone] [phone]   Clinic Follow up Note   Patient Care Team: Martinique, Betty G, MD as PCP - General (Family Medicine) Kyung Rudd, MD as Consulting Physician (Radiation Oncology) Truitt Merle, MD as Consulting Physician (Hematology) Rolm Bookbinder, MD as Consulting Physician (General Surgery) Gardenia Phlegm, NP as Nurse Practitioner (Hematology and Oncology) 05/20/2020  CHIEF COMPLAINT: F/u left breast cancer   SUMMARY OF ONCOLOGIC HISTORY: Oncology History Overview Note  Breast cancer of upper-outer quadrant of left female breast [facility])   Staging form: Breast, AJCC 7th Edition   - Clinical stage from 09/06/2016: Stage IIA (T1b, N1, M0) - Signed by Truitt Merle, MD on 09/15/2016   - Pathologic stage from 10/05/2016: Stage IIA (T1b, N1a, cM0) - Signed by Truitt Merle, MD on 10/24/2016    Breast cancer of upper-outer quadrant of left female breast ([place])  09/06/2016 Initial Diagnosis   Malignant neoplasm of upper-outer quadrant of right female breast ([place])   09/06/2016 Initial Biopsy   Left breast 3:00 position showed invasive ductal carcinoma and DCIS, left axillary lymph node biopsy showed metastatic carcinoma with extracapsular extension   09/06/2016 Receptors her2   Breast tumor ER 100% positive, PR 100% positive, HER-2 negative, Ki-67 5%   09/06/2016 Mammogram   Diagnostic mammogram and ultrasound showed a 1.0 cm mass at the 3 clock position of left breast, and the enlarged left axillary node with cortical thickening. The 60m cm oval cyst in the right breast is likely benign.   09/06/2016 Miscellaneous   Mammaprint showed low risk luminal A type, with average 10 year risk of recurrence untreated 10%.   09/16/2016 Genetic Testing   Genetic testing was normal, and did not reveal a deleterious mutation  One variant of uncertain significance (VUS) was found in the BRIP1 gene.  Genes tested include: ATM, BARD1, BRCA1, BRCA2,  BRIP1, CDH1, CHEK2, FANCC, MLH1, MSH2, MSH6, NBN, PALB2, PMS2, PTEN, RAD51C, RAD51D, TP53, and XRCC2.  This panel also includes deletion/duplication analysis (without sequencing) for one gene, EPCAM.     10/05/2016 Surgery   Left breast lumpectomy and SLN biopsy    10/05/2016 Pathology Results   Left breast lumpectomy showed invasive and in situ ductal carcinoma, 1 cm, grade 1, margins are negative, 2 sentinel lymph nodes are positive for metastatic carcinoma with extracapsular extension. LVI (+)   11/08/2016 - 12/24/2016 Radiation Therapy   Adjuvant radiation (Dr. MTobe Sos The Left breast was treated to 50.4 Gy in 28 fractions of 1.8 Gy per fraction. 2. The Left axilla was treated to 50.4 Gy in 28 fractions of 1.8 Gy per fraction.  3. The Left breast was boosted to 10 Gy in 5 fractions of 2 Gy per fraction.     01/10/2017 -  Anti-estrogen oral therapy   Exemestane 224mdaily   04/14/2017 Surgery   LAPAROSCOPIC BILATERAL SALPINGO OOPHORECTOMY by Dr. FoPamala Hurry 09/27/2017 Imaging    [facility] 09/27/17  IMPRESSION: 1. Small echogenic focus in the anterior right lobe of liver with diminished echogenicity peripherally. Possible atypical hemangioma but a metastatic deposit cannot be excluded. Consider CT or MRI of the abdomen to assess further. 2. No gallstones. 3. Portions of the head and tail of the pancreas are obscured by bowel gas.   10/14/2017 Imaging   MRI Abd W WO Contrast 10/14/17  IMPRESSION: 1. 8 by 4 mm lesion with signal and enhancement characteristics most typical for a small hemangioma in segment IVb of the liver. No  other candidate lesion to correspond with the sonographic finding. No MRI findings of suspicion for hepatic metastatic disease. 2. Small left adrenal adenoma. 3. Scattered descending colon diverticula.     CURRENT THERAPY: Exemestane 25 mg Daily started 01/10/17, pt stopped in mid March 2019 due to insurance issue. Started letrozole in 03/2018  INTERVAL  HISTORY: [name] returns for f/u as scheduled. She was last seen in 08/2019. She had a covid19 exposure in 11/2019 and the appointment was rescheduled. She continues aromasin, tolerates it "absolutely fine." Has mild hand and right hip stiffness. She attributes hip pain going on months to years due to her weight. She lost 16 lbs intentionally by doing plant based diet, eliminating carbs, and drinking water. Bowels moving better with new diet. Denies bleeding. Starting Paxil last visit has been life changing for her. She "handles life better" and feels well in general. She ran out last week, head has been throbbing and she had a few night sweats this week. She has dull breast pains in right and left breast not like when she was initially diagnosed. Denies new lump or nipple change.     MEDICAL HISTORY:  Past Medical History:  Diagnosis Date  . Arthritis   . Depression   . Headache    ocular migraines - very rare  . Malignant neoplasm of upper-outer quadrant of right female breast ([place]) 09/10/2016   pt states that cancer is on left breast, not right  . Urinary frequency     SURGICAL HISTORY: Past Surgical History:  Procedure Laterality Date  . BREAST LUMPECTOMY WITH RADIOACTIVE SEED AND SENTINEL LYMPH NODE BIOPSY Left 10/05/2016   Procedure: LEFT BREAST RADIOACTIVE SEED GUIDED LUMPECTOMY, LEFT AXILLARY RADIOACTIVE SEED GUIDED NODE EXCISION, LEFT AXILLARY SENTINEL LYMPH NODE BIOPSY(LEFT TARGETED AXILLARY DISSECTION);  Surgeon: Rolm Bookbinder, MD;  Location: [facility];  Service: General;  Laterality: Left;  . DENTAL SURGERY    . DILATION AND CURETTAGE OF UTERUS    . LAPAROSCOPIC BILATERAL SALPINGO OOPHERECTOMY Bilateral 04/14/2017   Procedure: LAPAROSCOPIC BILATERAL SALPINGO OOPHORECTOMY;  Surgeon: Aloha Gell, MD;  Location: [facility];  Service: Gynecology;  Laterality: Bilateral;  . right knee surgery at age of 29       I have reviewed the social history and family history with the  patient and they are unchanged from previous note.  ALLERGIES:  has No Known Allergies.  MEDICATIONS:  Current Outpatient Medications  Medication Sig Dispense Refill  . exemestane (AROMASIN) 25 MG tablet Take 1 tablet (25 mg total) by mouth daily after breakfast. 90 tablet 3  . PARoxetine (PAXIL) 20 MG tablet Take 1 tablet (20 mg total) by mouth daily. 90 tablet 2   No current facility-administered medications for this visit.    PHYSICAL EXAMINATION: ECOG PERFORMANCE STATUS: 1 - Symptomatic but completely ambulatory  Vitals:   05/20/20 0903  BP: 101/68  Pulse: 86  Resp: 17  Temp: 97.9 F (36.6 C)  SpO2: 96%   Filed Weights   05/20/20 0903  Weight: 194 lb (88 kg)    GENERAL:alert, no distress and comfortable SKIN: No rash to exposed skin EYES: sclera clear NECK: Without mass LYMPH:  no palpable cervical, supraclavicular lymphadenopathy LUNGS: clear with normal breathing effort HEART: regular rate & rhythm, no lower extremity edema ABDOMEN:abdomen soft, non-tender and normal bowel sounds Musculoskeletal: No focal tenderness NEURO: alert & oriented x 3 with fluent speech Breast: S/p left lumpectomy.  Incisions completely healed.  No palpable mass in either breast or axilla that  I could appreciate.  There are tender palpable densities in the left central breast superior to areola, 3 cm from nipple and the right breast 4 o'clock position, 8 cm from nipple  LABORATORY DATA:  I have reviewed the data as listed CBC Latest Ref Rng & Units 05/20/2020 08/27/2019 03/24/2018  WBC 4.0 - 10.5 K/uL 9.3 9.0 8.0  Hemoglobin 12.0 - 15.0 g/dL 14.7 14.1 13.6  Hematocrit 36 - 46 % 46.8(H) 44.0 40.7  Platelets 150 - 400 K/uL 522(H) 466(H) 431(H)     CMP Latest Ref Rng & Units 05/20/2020 08/27/2019 03/24/2018  Glucose 70 - 99 mg/dL 117(H) 103(H) 100  BUN 6 - 20 mg/dL _0 Creatinine 0.44 - 1.00 mg/dL 0.99 0.88 0.82  Sodium 135 - 145 mmol/L 139 140 140  Potassium 3.5 - 5.1 mmol/L 4.6 4.8  4.4  Chloride 98 - 111 mmol/L 107 107 106  CO2 22 - 32 mmol/L _1 Calcium 8.9 - 10.3 mg/dL 9.4 9.4 9.6  Total Protein 6.5 - 8.1 g/dL 7.7 7.2 6.9  Total Bilirubin 0.3 - 1.2 mg/dL 0.2(L) <0.2(L) 0.4  Alkaline Phos 38 - 126 U/L 162(H) 152(H) 183(H)  AST 15 - 41 U/L _2 ALT 0 - 44 U/L 31 27 33      RADIOGRAPHIC STUDIES: I have personally reviewed the radiological images as listed and agreed with the findings in the report. No results found.   ASSESSMENT & PLAN: 49 y.o.woman, presented with screening discovered left breast mass and axilla adenopathy.  1. Breast cancer of upper-outer quadrant of left female breast, pT1bN1aM0 stage IIA, G1, ER+/PR+/HER2-, Ki67 5%; mammaprint low risk luminal type A -diagnosed in 08/2016, s/p lumpectomy with a complete surgical resection, margins were negative, she had 2 positive sentinel lymph nodes with extracapsular extension, (+) LVI which are risk for recurrence.  -Mammaprint showed low risk disease, luminal type A. Her risk of recurrence in 10 years without adjuvant therapy is 10%. Her 5 year distant metastasis free survival is 97.8% with adjuvant antiestrogen therapy. -adjuvant chemotherapy was not recommended. s/p adjuvant radiation to reduce risk of local recurrence  -She started exemestane with ovarian suppression, s/p BSO.  Plan to continue 7 years -Due to high copay she was switched to letrozole in 03/2018, after 6 month supply ran out, she did not call for refill.  When I saw her on 08/27/2019 I refilled exemestane which is affordable for her.  She tolerates well with mild hot flashes and joint pain -Gloria Smith is clinically doing well.  Breast exam shows tender densities in the right lower inner quadrant and left upper central breast which I suspect are cysts versus fibroglandular tissue.  I have low concern for malignancy, but given her history will obtain diagnostic mammogram and possible ultrasound to further investigate.  Labs are  stable.  Otherwise no clinical concern for recurrence. -If mammogram/ultrasound are negative, continue breast cancer surveillance.  Continue exemestane which I refilled today -Follow-up in 6 months, or sooner if needed  2.  Thrombocytosis, polycythemia -She has had stable thrombocytosis 400-480 since 2017, no previous work-up.  Denies history of chronic infection, splenomegaly -Platelet count today slightly elevated to 522 with new elevated RBC 5.26 and hematocrit 46.8.  Normal hemoglobin, no other abnormal blood counts -This is possibly reactive/secondary from former smoking history. She also snores at night, has not had formal sleep study. -I recommend referral to pulmonary for sleep study to r/o out sleep apnea as potential cause  for secondary polycythemia -will check epo and Jak 2 to r/o PV -will f/u with her with results.   3. Depression  -she presented on 08/27/19 with tearfulness, lacking motivation, weight gain, poor sleep, negative body and intimacy issues. She admits to having depression. Denied SI/HI -she was previously on Paxil for hot flashes which helped vasomotor symptoms.  She restarted on 08/27/2019 with significant improvement in her depressed mood and other symptoms.   -Mood much improved today  -I refilled Paxil, continue current dose  4. Genetics  -Giving her young age and positive family history of breast cancer, she'll be referred to genetic counselor for genetic testing, to ruled out inheritable breast cancer syndrome. -Negativefor known pathogenic mutations within any of 20 genes on the Breast/Ovarian Cancer Panel through [employer]. One variant of uncertain significance (VUS) called "c.3571A>G (p.Ile1191Val)" was found in one copy of the BRIP1gene.   5. Hot flash  - secondary to BSO and exemestane, mild and tolerable -Hot flashes increased last week when she ran out of Paxil  6. Bone Health -Normal DEXA in 08/2017 -We discussed menopause and  exemestane can weaken her bone.  -Order placed to repeat DEXA this year  7. Health maintenance -she is requesting referral to a new PCP -I encouraged her to continue annual health f/u and routine labs -She has made positive lifestyle changes including switch to a plant-based diet and losing weight intentionally  -She is a former smoker but restarted last week when she ran out of Paxil.  She did does not plan to continue smoking.   -New age to begin screening colonoscopy is 77  PLAN: -CBC, CMP reviewed -Add on lab epo and jak 2 to r/o PV -referral to pulm for sleep study -Diagnostic bilat mammo for breast pain and palpable densities  -DEXA due now, can schedule with mammogram  -Refill paxil and aromasin -Phone call with results  -Repeat lab in 3 and 6 months -If work up is negative, f/u in 6 months for routine breast cancer surveillance   No problem-specific Assessment & Plan notes found for this encounter.   Orders Placed This Encounter  Procedures  . JAK2 (INCLUDING V617F AND EXON 12), MPL,& CALR W/RFL MPN PANEL (NGS)    Standing Status:   Standing    Number of Occurrences:   1    Standing Expiration Date:   05/20/2021  . Erythropoietin    Standing Status:   Standing    Number of Occurrences:   1    Standing Expiration Date:   05/20/2021  . Ambulatory referral to Pulmonology    Referral Priority:   Routine    Referral Type:   Consultation    Referral Reason:   Specialty Services Required    Requested Specialty:   Pulmonary Disease    Number of Visits Requested:   1   All questions were answered. The patient knows to call the clinic with any problems, questions or concerns. No barriers to learning was detected. Total encounter time was 30 minutes.     Alla Feeling, NP 05/20/20

## 2020-05-21 ENCOUNTER — Telehealth: Payer: Self-pay | Admitting: Nurse Practitioner

## 2020-05-21 LAB — ERYTHROPOIETIN: Erythropoietin: 4.7 m[IU]/mL (ref 2.6–18.5)

## 2020-05-21 NOTE — Telephone Encounter (Signed)
Scheduled per 6/29 los. Pt is aware of appts. 

## 2020-05-30 ENCOUNTER — Encounter: Payer: Self-pay | Admitting: Nurse Practitioner

## 2020-06-02 ENCOUNTER — Other Ambulatory Visit: Payer: Self-pay

## 2020-06-02 ENCOUNTER — Ambulatory Visit (INDEPENDENT_AMBULATORY_CARE_PROVIDER_SITE_OTHER): Payer: 59 | Admitting: Internal Medicine

## 2020-06-02 ENCOUNTER — Telehealth: Payer: Self-pay

## 2020-06-02 ENCOUNTER — Encounter: Payer: Self-pay | Admitting: Internal Medicine

## 2020-06-02 VITALS — BP 104/68 | HR 87 | Temp 98.5°F | Ht 63.5 in | Wt 194.6 lb

## 2020-06-02 DIAGNOSIS — G4734 Idiopathic sleep related nonobstructive alveolar hypoventilation: Secondary | ICD-10-CM | POA: Diagnosis not present

## 2020-06-02 DIAGNOSIS — C50412 Malignant neoplasm of upper-outer quadrant of left female breast: Secondary | ICD-10-CM

## 2020-06-02 DIAGNOSIS — D751 Secondary polycythemia: Secondary | ICD-10-CM

## 2020-06-02 DIAGNOSIS — R0683 Snoring: Secondary | ICD-10-CM | POA: Diagnosis not present

## 2020-06-02 DIAGNOSIS — Z170 Estrogen receptor positive status [ER+]: Secondary | ICD-10-CM

## 2020-06-02 DIAGNOSIS — G4733 Obstructive sleep apnea (adult) (pediatric): Secondary | ICD-10-CM

## 2020-06-02 NOTE — Telephone Encounter (Signed)
Colletta [place], thanks for the update. Please call breast center to see if they can do it sooner. She has h/o left breast cancer now with bilateral breast tenderness and palpable densities.  Thanks, Regan Rakers

## 2020-06-02 NOTE — Telephone Encounter (Signed)
Patient Message Open  05/30/2020 [facility] ) Gloria Smith, Gloria Smith to Alla Feeling, NP     05/30/20 1:50 PM I had not heard from [facility] for an appt, so I called today and they do not have the referral and told me the earliest appt they could get me was August 12th.    I followed up with this pt concerning her mammogram she does have an appt for 07/03/2020 I spoke with [facility]) and this is the earliest appt they had if someone should cancel they could call to see if she would be available all paperwork faxed to [phone] pt states she would like to be referred elsewhere if she can get an appt before Jun 29 2020 I advised pt we would make an attempt and make her aware of anything new or changing

## 2020-06-02 NOTE — Progress Notes (Signed)
06/02/20- 33 yoF former smoker for sleep evaluation courtesy of Cira Rue, NP, with concern that sleep apnea might be related to thrombocytosis and polycythemia Originally from [place] 26 yrs ago. Medical problem list includes  Breast Cancer Left, Thrombocytosis,  Lab 05/20/20- Hgb 14.7/ Hct 46.8, Plt 522 K,  Epworth score "0" Body weight today 194 lbs Aware of loud snoring. Has difficulty initiating sleep, then toss and turn, " busy brain". No sleep meds. Notices little daytime sleepiness.  Unable to sleep if she tries to nap. No sleep meds. 1 cup AM coffee.  Denies ENT surgery, heart or lung disease. No complex parasomnias.   Prior to Admission medications   Medication Sig Start Date End Date Taking? Authorizing Provider  exemestane (AROMASIN) 25 MG tablet Take 1 tablet (25 mg total) by mouth daily after breakfast. 05/20/20  Yes Alla Feeling, NP  PARoxetine (PAXIL) 20 MG tablet Take 1 tablet (20 mg total) by mouth daily. 05/20/20  Yes Alla Feeling, NP   Past Medical History:  Diagnosis Date   Arthritis    Depression    Headache    ocular migraines - very rare   Malignant neoplasm of upper-outer quadrant of right female breast ([place]) 09/10/2016   pt states that cancer is on left breast, not right   Urinary frequency    Past Surgical History:  Procedure Laterality Date   BREAST LUMPECTOMY WITH RADIOACTIVE SEED AND SENTINEL LYMPH NODE BIOPSY Left 10/05/2016   Procedure: LEFT BREAST RADIOACTIVE SEED GUIDED LUMPECTOMY, LEFT AXILLARY RADIOACTIVE SEED GUIDED NODE EXCISION, LEFT AXILLARY SENTINEL LYMPH NODE BIOPSY(LEFT TARGETED AXILLARY DISSECTION);  Surgeon: Rolm Bookbinder, MD;  Location: [facility];  Service: General;  Laterality: Left;   DENTAL SURGERY     DILATION AND CURETTAGE OF UTERUS     LAPAROSCOPIC BILATERAL SALPINGO OOPHERECTOMY Bilateral 04/14/2017   Procedure: LAPAROSCOPIC BILATERAL SALPINGO OOPHORECTOMY;  Surgeon: Aloha Gell, MD;  Location: [facility];  Service:  Gynecology;  Laterality: Bilateral;   right knee surgery at age of 81      Family History  Problem Relation Age of Onset   Colon cancer Paternal Grandmother 89       d. 57   Cancer Paternal Grandmother    Breast cancer Cousin        paternal 1st cousin, once-removed; dx. 64s   Other Mother        hx precancerous skin finding removed from eyelid at 36-71   Arthritis Mother    Hypertension Father    Heart attack Maternal Grandmother 78   Early death Maternal Grandmother    Heart disease Maternal Grandmother    Stroke Maternal Grandfather    Heart attack Paternal Grandfather 82   Early death Paternal Grandfather    Other Other 72       hx of precancerous finding on abnormal pap smear   Cancer Other        maternal great aunt (MGM's sister) d. early 62s; spinal cancer, but this may have been a secondary cancer w/ NOS primary   Cancer Other        maternal great grandmother (MGM's mother) d. 71s w/ NOS cancer   Cancer Other        paternal great uncle (PGM's brother) d. 58s w/ NOS cancer   Social History   Socioeconomic History   Marital status: Single    Spouse name: Not on file   Number of children: 1   Years of education: Not on file   Highest  education level: Not on file  Occupational History   Not on file  Tobacco Use   Smoking status: Former Smoker    Packs/day: 0.50    Years: 30.00    Pack years: 15.00    Quit date: 08/22/2016    Years since quitting: 3.7   Smokeless tobacco: Never Used  Vaping Use   Vaping Use: Never used  Substance and Sexual Activity   Alcohol use: Yes    Alcohol/week: 1.0 standard drink    Types: 1 Glasses of wine per week    Comment: 6 drinks wine or beer a week    Drug use: No   Sexual activity: Yes    Birth control/protection: Injection  Other Topics Concern   Not on file  Social History Narrative   Not on file   Social Determinants of Health   Financial Resource Strain:    Difficulty of Paying  Living Expenses:   Food Insecurity:    Worried About Charity fundraiser in the Last Year:    Arboriculturist in the Last Year:   Transportation Needs:    Film/video editor (Medical):    Lack of Transportation (Non-Medical):   Physical Activity: Unknown   Days of Exercise per Week: 3 days   Minutes of Exercise per Session: Not on file  Stress:    Feeling of Stress :   Social Connections:    Frequency of Communication with Friends and Family:    Frequency of Social Gatherings with Friends and Family:    Attends Religious Services:    Active Member of Clubs or Organizations:    Attends Music therapist:    Marital Status:   Intimate Partner Violence:    Fear of Current or Ex-Partner:    Emotionally Abused:    Physically Abused:    Sexually Abused:    ROS-see HPI   + = positive Constitutional:    weight loss, night sweats, fevers, chills, fatigue, lassitude. HEENT:    headaches, difficulty swallowing,+ooth/dental problems, sore throat,       sneezing, +itching,+ ear ache, nasal congestion, post nasal drip, snoring CV:    chest pain, orthopnea, PND, swelling in lower extremities, anasarca,                                   dizziness, palpitations Resp:   +shortness of breath with exertion or at rest.                productive cough,   non-productive cough, coughing up of blood.              change in color of mucus.  wheezing.   Skin:    rash or lesions. GI:  No-   Heartburn,++ indigestion, +abdominal pain, nausea, vomiting, diarrhea,                 change in bowel habits, loss of appetite GU: dysuria, change in color of urine, no urgency or frequency.   flank pain. MS:   +joint pain, stiffness, decreased range of motion, back pain. Neuro-     nothing unusual Psych:  change in mood or affect.  +depression or anxiety.   memory loss.  OBJ- Physical Exam General- Alert, Oriented, Affect-appropriate, Distress- none acute, + overweight Skin-  rash-none, lesions- none, excoriation- none Lymphadenopathy- none Head- atraumatic  Eyes- Gross vision intact, PERRLA, conjunctivae and secretions clear            Ears- Hearing, canals-normal            Nose- Clear, no-Septal dev, mucus, polyps, erosion, perforation             Throat- Mallampati III , mucosa clear , drainage- none, tonsils- atrophic, + teeth Neck- flexible , trachea midline, no stridor , thyroid nl, carotid no bruit Chest - symmetrical excursion , unlabored           Heart/CV- RRR , no murmur , no gallop  , no rub, nl s1 s2                           - JVD- none , edema- none, stasis changes- none, varices- none           Lung- clear to P&A, wheeze- none, cough- none , dullness-none, rub- none           Chest wall-  Abd-  Br/ Gen/ Rectal- Not done, not indicated Extrem- cyanosis- none, clubbing, none, atrophy- none, strength- nl Neuro- grossly intact to observation

## 2020-06-02 NOTE — Patient Instructions (Signed)
Order- please schedule home seep test    Dx polycythemia, nocturnal hypoxemia  Please call us about 2 weeks after your sleep study to see if results and recommendations are ready yet. If appropriate, we may be ale to start treatment before we see you next.

## 2020-06-03 ENCOUNTER — Telehealth: Payer: Self-pay

## 2020-06-03 ENCOUNTER — Other Ambulatory Visit: Payer: Self-pay

## 2020-06-03 ENCOUNTER — Telehealth: Payer: Self-pay | Admitting: Nurse Practitioner

## 2020-06-03 DIAGNOSIS — D75839 Thrombocytosis, unspecified: Secondary | ICD-10-CM

## 2020-06-03 LAB — JAK2 (INCLUDING V617F AND EXON 12), MPL,& CALR W/RFL MPN PANEL (NGS)

## 2020-06-03 NOTE — Telephone Encounter (Signed)
Called patient to review EPO and MPN panel are negative/normal. It's possible she could still have MPN/ET. However with her mild gradual rise in plt count this does not require bone marrow biopsy or treatment at this time. If PLT >600 we would likely recommend bone marrow biopsy and/or proceed with whole body imaging. This could be reactive to malignancy but she had imaging in 2018 which was negative. Will add on iron studies with next lab in 3 months to r/o iron deficiency which could cause thrombocytosis.  I reviewed her labs with Dr. Burr Medico, we are recommending to remain UTD on age appropriate cancer screenings; she needs a GI referral for colonoscopy and one was placed today. I recommend to continue healthy diet and exercise to reduce thrombosis risk. I recommend to start baby aspirin 81 mg daily. She agrees. She has sleep study due next week. I also encouraged her to avoid smoking. Will f/u on studies and labs. she appreciates the call and has no further questions at this time.   Gloria Rue, NP

## 2020-06-03 NOTE — Telephone Encounter (Signed)
  At time of note pt called made aware Teola Bradley was able to see her Thursday 06/05/20 at [place] pt DECLINED this appt stating she would be away this nurse explained that in declining this appt pt would need to wait until earliest appt pt agrees and next available appt scheduled at [facility] for [facility] 19 at [place], [place], NP at 06/02/2020 8:55 PM  Status: Signed    Colletta Maryland, thanks for the update. Please call breast center to see if they can do it sooner. She has h/o left breast cancer now with bilateral breast tenderness and palpable densities.  Thanks, Lacie       Patient Message Open  05/30/2020 [facility] ) Eudelia, [place] to Alla Feeling, NP    05/30/20 1:50 PM I had not heard from [facility] for an appt, so I called today and they do not have the referral and told me the earliest appt they could get me was August 12th.    I followed up with this pt concerning her mammogram she does have an appt for 07/03/2020 I spoke with [facility]) and this is the earliest appt they had if someone should cancel they could call to see if she would be available all paperwork faxed to [phone] pt states she would like to be referred elsewhere if she can get an appt before Jun 29 2020 I advised pt we would make an attempt and make her aware of anything new or changing

## 2020-06-04 DIAGNOSIS — R0683 Snoring: Secondary | ICD-10-CM | POA: Insufficient documentation

## 2020-06-04 NOTE — Assessment & Plan Note (Addendum)
Probable obstructive sleep apnea. Appropriate discussion done. Importance of weight and respsonsibility for safe driving. Question is if there is enough desaturation to explain polycythemia.  Plan- Sleep study

## 2020-06-04 NOTE — Assessment & Plan Note (Signed)
Being managed by Oncology.

## 2020-06-23 ENCOUNTER — Other Ambulatory Visit: Payer: Self-pay

## 2020-06-23 ENCOUNTER — Ambulatory Visit: Payer: 59

## 2020-06-23 DIAGNOSIS — G4734 Idiopathic sleep related nonobstructive alveolar hypoventilation: Secondary | ICD-10-CM

## 2020-06-23 DIAGNOSIS — R0683 Snoring: Secondary | ICD-10-CM | POA: Diagnosis not present

## 2020-06-23 DIAGNOSIS — D751 Secondary polycythemia: Secondary | ICD-10-CM

## 2020-07-01 DIAGNOSIS — R0683 Snoring: Secondary | ICD-10-CM | POA: Diagnosis not present

## 2020-07-07 ENCOUNTER — Encounter: Payer: Self-pay | Admitting: Gastroenterology

## 2020-07-10 ENCOUNTER — Telehealth: Payer: Self-pay | Admitting: Internal Medicine

## 2020-07-10 NOTE — Telephone Encounter (Signed)
Called and let patient know we would call her once the study has been resulted. Patient can see results in mychart. However, the study has not been resulted by Provider

## 2020-07-14 NOTE — Telephone Encounter (Signed)
Sleep study did not show significant sleep apnea. Oxygen dropped a little bit with the few apnea events, but average was normal and I don't think this explains the abnormal blood cell counts. We can see her again if needed.

## 2020-07-14 NOTE — Telephone Encounter (Signed)
Dr. Annamaria Boots could you please advise on the results of the patient's sleep study

## 2020-07-15 NOTE — Telephone Encounter (Signed)
Attempted to call pt but unable to reach and unable to leave VM. Will try to call back later. 

## 2020-07-16 ENCOUNTER — Encounter: Payer: Self-pay | Admitting: Hematology

## 2020-07-17 NOTE — Telephone Encounter (Signed)
lmtcb for pt.  

## 2020-07-22 ENCOUNTER — Telehealth: Payer: Self-pay

## 2020-07-22 NOTE — Telephone Encounter (Signed)
Called and spoke to pt. Informed her of the results per Dr. Annamaria Boots. Pt verbalized understanding and denied any further questions or concerns at this time.

## 2020-07-22 NOTE — Telephone Encounter (Signed)
Called patient and gave normal bone density results per Dr Burr Medico. Patient verbalized understanding.

## 2020-08-07 ENCOUNTER — Encounter: Payer: Self-pay | Admitting: Nurse Practitioner

## 2020-08-20 ENCOUNTER — Other Ambulatory Visit: Payer: Self-pay

## 2020-08-20 ENCOUNTER — Inpatient Hospital Stay: Payer: 59 | Attending: Hematology

## 2020-08-20 DIAGNOSIS — D473 Essential (hemorrhagic) thrombocythemia: Secondary | ICD-10-CM | POA: Insufficient documentation

## 2020-08-20 DIAGNOSIS — C50412 Malignant neoplasm of upper-outer quadrant of left female breast: Secondary | ICD-10-CM | POA: Diagnosis not present

## 2020-08-20 DIAGNOSIS — Z170 Estrogen receptor positive status [ER+]: Secondary | ICD-10-CM

## 2020-08-20 DIAGNOSIS — D75839 Thrombocytosis, unspecified: Secondary | ICD-10-CM

## 2020-08-20 LAB — CBC WITH DIFFERENTIAL/PLATELET
Abs Immature Granulocytes: 0.03 10*3/uL (ref 0.00–0.07)
Basophils Absolute: 0.1 10*3/uL (ref 0.0–0.1)
Basophils Relative: 1 %
Eosinophils Absolute: 0.2 10*3/uL (ref 0.0–0.5)
Eosinophils Relative: 3 %
HCT: 43.6 % (ref 36.0–46.0)
Hemoglobin: 14.1 g/dL (ref 12.0–15.0)
Immature Granulocytes: 0 %
Lymphocytes Relative: 28 %
Lymphs Abs: 2.3 10*3/uL (ref 0.7–4.0)
MCH: 28.5 pg (ref 26.0–34.0)
MCHC: 32.3 g/dL (ref 30.0–36.0)
MCV: 88.3 fL (ref 80.0–100.0)
Monocytes Absolute: 0.6 10*3/uL (ref 0.1–1.0)
Monocytes Relative: 8 %
Neutro Abs: 5 10*3/uL (ref 1.7–7.7)
Neutrophils Relative %: 60 %
Platelets: 464 10*3/uL — ABNORMAL HIGH (ref 150–400)
RBC: 4.94 MIL/uL (ref 3.87–5.11)
RDW: 13.3 % (ref 11.5–15.5)
WBC: 8.3 10*3/uL (ref 4.0–10.5)
nRBC: 0 % (ref 0.0–0.2)

## 2020-08-20 LAB — COMPREHENSIVE METABOLIC PANEL
ALT: 25 U/L (ref 0–44)
AST: 14 U/L — ABNORMAL LOW (ref 15–41)
Albumin: 3.4 g/dL — ABNORMAL LOW (ref 3.5–5.0)
Alkaline Phosphatase: 141 U/L — ABNORMAL HIGH (ref 38–126)
Anion gap: 9 (ref 5–15)
BUN: 12 mg/dL (ref 6–20)
CO2: 24 mmol/L (ref 22–32)
Calcium: 9.1 mg/dL (ref 8.9–10.3)
Chloride: 108 mmol/L (ref 98–111)
Creatinine, Ser: 0.84 mg/dL (ref 0.44–1.00)
GFR calc Af Amer: >60 mL/min (ref >60)
GFR calc non Af Amer: >60 mL/min (ref >60)
Glucose, Bld: 102 mg/dL — ABNORMAL HIGH (ref 70–99)
Potassium: 4.3 mmol/L (ref 3.5–5.1)
Sodium: 141 mmol/L (ref 135–145)
Total Bilirubin: 0.3 mg/dL (ref 0.3–1.2)
Total Protein: 6.9 g/dL (ref 6.5–8.1)

## 2020-08-20 LAB — FERRITIN: Ferritin: 58 ng/mL (ref 11–307)

## 2020-08-20 LAB — IRON AND TIBC
Iron: 50 ug/dL (ref 41–142)
Saturation Ratios: 17 % — ABNORMAL LOW (ref 21–57)
TIBC: 300 ug/dL (ref 236–444)
UIBC: 250 ug/dL (ref 120–384)

## 2020-08-21 ENCOUNTER — Ambulatory Visit (AMBULATORY_SURGERY_CENTER): Payer: Self-pay | Admitting: *Deleted

## 2020-08-21 ENCOUNTER — Other Ambulatory Visit: Payer: Self-pay

## 2020-08-21 VITALS — Ht 64.0 in | Wt 193.0 lb

## 2020-08-21 DIAGNOSIS — Z1211 Encounter for screening for malignant neoplasm of colon: Secondary | ICD-10-CM

## 2020-08-21 MED ORDER — PLENVU 140 G PO SOLR: 1.0000 | Freq: Once | ORAL | 0 refills | Status: AC

## 2020-08-21 NOTE — Progress Notes (Signed)
Completed covid vaccines 03-04-20  Pt is aware that care partner will wait in the car during procedure; if they feel like they will be too hot or cold to wait in the car; they may wait in the 4 th floor lobby. Patient is aware to bring only one care partner. We want them to wear a mask (we do not have any that we can provide them), practice social distancing, and we will check their temperatures when they get here.  I did remind the patient that their care partner needs to stay in the parking lot the entire time and have a cell phone available, we will call them when the pt is ready for discharge. Patient will wear mask into building.   No trouble with anesthesia, difficulty with intubation or moving neck,  or hx/fam hx of malignant hyperthermia per pt    No egg or soy allergy  No home oxygen use   No medications for weight loss taken  emmi information given  Pt denies constipation issues  Plenvu coupon given

## 2020-08-22 ENCOUNTER — Encounter: Payer: Self-pay | Admitting: Gastroenterology

## 2020-08-25 ENCOUNTER — Encounter: Payer: Self-pay | Admitting: Hematology

## 2020-08-29 ENCOUNTER — Encounter: Payer: Self-pay | Admitting: Gastroenterology

## 2020-08-29 ENCOUNTER — Ambulatory Visit (AMBULATORY_SURGERY_CENTER): Payer: 59 | Admitting: Gastroenterology

## 2020-08-29 ENCOUNTER — Other Ambulatory Visit: Payer: Self-pay

## 2020-08-29 VITALS — BP 118/56 | HR 62 | Temp 97.3°F | Resp 15 | Ht 64.0 in | Wt 193.0 lb

## 2020-08-29 DIAGNOSIS — D122 Benign neoplasm of ascending colon: Secondary | ICD-10-CM | POA: Diagnosis not present

## 2020-08-29 DIAGNOSIS — D124 Benign neoplasm of descending colon: Secondary | ICD-10-CM | POA: Diagnosis not present

## 2020-08-29 DIAGNOSIS — D123 Benign neoplasm of transverse colon: Secondary | ICD-10-CM

## 2020-08-29 DIAGNOSIS — Z1211 Encounter for screening for malignant neoplasm of colon: Secondary | ICD-10-CM

## 2020-08-29 DIAGNOSIS — D120 Benign neoplasm of cecum: Secondary | ICD-10-CM | POA: Diagnosis not present

## 2020-08-29 MED ORDER — SODIUM CHLORIDE 0.9 % IV SOLN: 500.0000 mL | Freq: Once | INTRAVENOUS | Status: DC

## 2020-08-29 NOTE — Patient Instructions (Signed)
Handouts provided on polyps, diverticulosis, hemorrhoids and High-fiber diet.   High-fiber diet (see handout)  YOU HAD AN ENDOSCOPIC PROCEDURE TODAY AT [facility]:   Refer to the procedure report that was given to you for any specific questions about what was found during the examination.  If the procedure report does not answer your questions, please call your gastroenterologist to clarify.  If you requested that your care partner not be given the details of your procedure findings, then the procedure report has been included in a sealed envelope for you to review at your convenience later.  YOU SHOULD EXPECT: Some feelings of bloating in the abdomen. Passage of more gas than usual.  Walking can help get rid of the air that was put into your GI tract during the procedure and reduce the bloating. If you had a lower endoscopy (such as a colonoscopy or flexible sigmoidoscopy) you may notice spotting of blood in your stool or on the toilet paper. If you underwent a bowel prep for your procedure, you may not have a normal bowel movement for a few days.  Please Note:  You might notice some irritation and congestion in your nose or some drainage.  This is from the oxygen used during your procedure.  There is no need for concern and it should clear up in a day or so.  SYMPTOMS TO REPORT IMMEDIATELY:   Following lower endoscopy (colonoscopy or flexible sigmoidoscopy):  Excessive amounts of blood in the stool  Significant tenderness or worsening of abdominal pains  Swelling of the abdomen that is new, acute  Fever of 100F or higher  For urgent or emergent issues, a gastroenterologist can be reached at any hour by calling [phone]. Do not use MyChart messaging for urgent concerns.    DIET:  We do recommend a small meal at first, but then you may proceed to your regular diet.  Drink plenty of fluids but you should avoid alcoholic beverages for 24 hours.  ACTIVITY:  You should  plan to take it easy for the rest of today and you should NOT DRIVE or use heavy machinery until tomorrow (because of the sedation medicines used during the test).    FOLLOW UP: Our staff will call the number listed on your records 48-72 hours following your procedure to check on you and address any questions or concerns that you may have regarding the information given to you following your procedure. If we do not reach you, we will leave a message.  We will attempt to reach you two times.  During this call, we will ask if you have developed any symptoms of COVID 19. If you develop any symptoms (ie: fever, flu-like symptoms, shortness of breath, cough etc.) before then, please call [phone].  If you test positive for Covid 19 in the 2 weeks post procedure, please call and report this information to [place].    If any biopsies were taken you will be contacted by phone or by letter within the next 1-3 weeks.  Please call us at [phone] if you have not heard about the biopsies in 3 weeks.    SIGNATURES/CONFIDENTIALITY: You and/or your care partner have signed paperwork which will be entered into your electronic medical record.  These signatures attest to the fact that that the information above on your After Visit Summary has been reviewed and is understood.  Full responsibility of the confidentiality of this discharge information lies with you and/or your care-partner.

## 2020-08-29 NOTE — Op Note (Signed)
[place] Patient Name: Gloria Smith Procedure Date: 08/29/2020 11:32 AM MRN: [id number] Endoscopist: Ladene Artist , MD Age: 49 Referring MD:  Date of Birth: 04/09/1971 Gender: Female Account #: [id number] Procedure:                Colonoscopy Indications:              Screening for colorectal malignant neoplasm Medicines:                Monitored Anesthesia Care Procedure:                Pre-Anesthesia Assessment:                           - Prior to the procedure, a History and Physical                            was performed, and patient medications and                            allergies were reviewed. The patient's tolerance of                            previous anesthesia was also reviewed. The risks                            and benefits of the procedure and the sedation                            options and risks were discussed with the patient.                            All questions were answered, and informed consent                            was obtained. Prior Anticoagulants: The patient has                            taken no previous anticoagulant or antiplatelet                            agents. ASA Grade Assessment: II - A patient with                            mild systemic disease. After reviewing the risks                            and benefits, the patient was deemed in                            satisfactory condition to undergo the procedure.                           After obtaining informed consent, the colonoscope  was passed under direct vision. Throughout the                            procedure, the patient's blood pressure, pulse, and                            oxygen saturations were monitored continuously. The                            Colonoscope was introduced through the anus and                            advanced to the the cecum, identified by                            appendiceal orifice and  ileocecal valve. The                            ileocecal valve, appendiceal orifice, and rectum                            were photographed. The quality of the bowel                            preparation was excellent. The colonoscopy was                            performed without difficulty. The patient tolerated                            the procedure well. Scope In: 11:38:11 AM Scope Out: 11:57:52 AM Scope Withdrawal Time: 0 hours 15 minutes 57 seconds  Total Procedure Duration: 0 hours 19 minutes 41 seconds  Findings:                 The perianal and digital rectal examinations were                            normal.                           Six sessile polyps were found in the descending                            colon (2), transverse colon (1), ascending colon                            (2) and cecum (1). The polyps were 6 to 8 mm in                            size. These polyps were removed with a cold snare.                            Resection and retrieval were complete.  Multiple small-mouthed diverticula were found in                            the left colon. There was no evidence of                            diverticular bleeding.                           Internal hemorrhoids were found during                            retroflexion. The hemorrhoids were small and Grade                            I (internal hemorrhoids that do not prolapse).                           The exam was otherwise without abnormality on                            direct and retroflexion views. Complications:            No immediate complications. Estimated blood loss:                            None. Estimated Blood Loss:     Estimated blood loss: none. Impression:               - Six 6 to 8 mm polyps in the descending colon, in                            the transverse colon, in the ascending colon and in                            the cecum, removed with a  cold snare. Resected and                            retrieved.                           - Moderate diverticulosis in the left colon.                           - Internal hemorrhoids.                           - The examination was otherwise normal on direct                            and retroflexion views. Recommendation:           - Repeat colonoscopy after studies are complete for                            surveillance based on pathology results.                           -  Patient has a contact number available for                            emergencies. The signs and symptoms of potential                            delayed complications were discussed with the                            patient. Return to normal activities tomorrow.                            Written discharge instructions were provided to the                            patient.                           - High fiber diet.                           - Continue present medications.                           - Await pathology results. Ladene Artist, MD 08/29/2020 12:01:45 PM This report has been signed electronically.

## 2020-08-29 NOTE — Progress Notes (Signed)
Called to room to assist during endoscopic procedure.  Patient ID and intended procedure confirmed with present staff. Received instructions for my participation in the procedure from the performing physician.  

## 2020-08-29 NOTE — Progress Notes (Signed)
Pt's states no medical or surgical changes since previsit or office visit. 

## 2020-08-29 NOTE — Progress Notes (Signed)
To PACU, VSS. Report to [email or web address] 

## 2020-09-02 ENCOUNTER — Telehealth: Payer: Self-pay | Admitting: *Deleted

## 2020-09-02 NOTE — Telephone Encounter (Signed)
  Follow up Call-  Call back number 08/29/2020  Post procedure Call Back phone  # [phone]  Permission to leave phone message Yes  Some recent data might be hidden     Patient questions:  Do you have a fever, pain , or abdominal swelling? No. Pain Score  0 *  Have you tolerated food without any problems? Yes.    Have you been able to return to your normal activities? Yes.    Do you have any questions about your discharge instructions: Diet   No. Medications  No. Follow up visit  No.  Do you have questions or concerns about your Care? No.  Actions: * If pain score is 4 or above: No action needed, pain <4  1. Have you developed a fever since your procedure? NO  2.   Have you had an respiratory symptoms (SOB or cough) since your procedure? NO  3.   Have you tested positive for COVID 19 since your procedure NO  4.   Have you had any family members/close contacts diagnosed with the COVID 19 since your procedure?  NO   If yes to any of these questions please route to Joylene John, RN and Joella Prince, RN

## 2020-09-04 ENCOUNTER — Encounter: Payer: Self-pay | Admitting: Gastroenterology

## 2020-09-08 ENCOUNTER — Ambulatory Visit: Payer: 59 | Admitting: Internal Medicine

## 2020-09-08 NOTE — Progress Notes (Deleted)
06/02/20- 73 yoF former smoker for sleep evaluation courtesy of Cira Rue, NP, with concern that sleep apnea might be related to thrombocytosis and polycythemia Originally from [place] 26 yrs ago. Medical problem list includes  Breast Cancer Left, Thrombocytosis,  Lab 05/20/20- Hgb 14.7/ Hct 46.8, Plt 522 K,  Epworth score "0" Body weight today 194 lbs Aware of loud snoring. Has difficulty initiating sleep, then toss and turn, " busy brain". No sleep meds. Notices little daytime sleepiness.  Unable to sleep if she tries to nap. No sleep meds. 1 cup AM coffee.  Denies ENT surgery, heart or lung disease. No complex parasomnias.   09/08/20- 65 yoF former smoker (origianlly from [place] 26 yrs ago)  For sleep evaluation concerned about possible relation to thrombocytosis and Polycythemia, complicated by L Breast Cancer HST 06/23/20- AHI 3.9/ hr, desaturation to 77% with mean sat 93%, body weight 194 lbs    ROS-see HPI   + = positive Constitutional:    weight loss, night sweats, fevers, chills, fatigue, lassitude. HEENT:    headaches, difficulty swallowing,+ooth/dental problems, sore throat,       sneezing, +itching,+ ear ache, nasal congestion, post nasal drip, snoring CV:    chest pain, orthopnea, PND, swelling in lower extremities, anasarca,                                   dizziness, palpitations Resp:   +shortness of breath with exertion or at rest.                productive cough,   non-productive cough, coughing up of blood.              change in color of mucus.  wheezing.   Skin:    rash or lesions. GI:  No-   Heartburn,++ indigestion, +abdominal pain, nausea, vomiting, diarrhea,                 change in bowel habits, loss of appetite GU: dysuria, change in color of urine, no urgency or frequency.   flank pain. MS:   +joint pain, stiffness, decreased range of motion, back pain. Neuro-     nothing unusual Psych:  change in mood or affect.  +depression or anxiety.   memory  loss.  OBJ- Physical Exam General- Alert, Oriented, Affect-appropriate, Distress- none acute, + overweight Skin- rash-none, lesions- none, excoriation- none Lymphadenopathy- none Head- atraumatic            Eyes- Gross vision intact, PERRLA, conjunctivae and secretions clear            Ears- Hearing, canals-normal            Nose- Clear, no-Septal dev, mucus, polyps, erosion, perforation             Throat- Mallampati III , mucosa clear , drainage- none, tonsils- atrophic, + teeth Neck- flexible , trachea midline, no stridor , thyroid nl, carotid no bruit Chest - symmetrical excursion , unlabored           Heart/CV- RRR , no murmur , no gallop  , no rub, nl s1 s2                           - JVD- none , edema- none, stasis changes- none, varices- none           Lung- clear to P&A, wheeze- none, cough-  none , dullness-none, rub- none           Chest wall-  Abd-  Br/ Gen/ Rectal- Not done, not indicated Extrem- cyanosis- none, clubbing, none, atrophy- none, strength- nl Neuro- grossly intact to observation

## 2020-09-26 ENCOUNTER — Telehealth: Payer: Self-pay | Admitting: Hematology

## 2020-09-26 NOTE — Telephone Encounter (Signed)
Rescheduled appts on 12/29 to 1/3. Provider on PAL. Pt is aware of appt times and date.

## 2020-11-19 ENCOUNTER — Ambulatory Visit: Payer: 59 | Admitting: Hematology

## 2020-11-19 ENCOUNTER — Other Ambulatory Visit: Payer: 59

## 2020-11-23 ENCOUNTER — Encounter: Payer: Self-pay | Admitting: Hematology

## 2020-11-24 ENCOUNTER — Telehealth: Payer: Self-pay | Admitting: Hematology

## 2020-11-24 ENCOUNTER — Inpatient Hospital Stay: Payer: 59

## 2020-11-24 ENCOUNTER — Inpatient Hospital Stay: Payer: 59 | Admitting: Hematology

## 2020-11-24 DIAGNOSIS — C50412 Malignant neoplasm of upper-outer quadrant of left female breast: Secondary | ICD-10-CM

## 2020-11-24 NOTE — Progress Notes (Incomplete)
[place]   [phone] [phone]   Clinic Follow up Note   Patient Care Team: Martinique, Betty G, MD as PCP - General (Family Medicine) Kyung Rudd, MD as Consulting Physician (Radiation Oncology) Truitt Merle, MD as Consulting Physician (Hematology) Rolm Bookbinder, MD as Consulting Physician (General Surgery) Gardenia Phlegm, NP as Nurse Practitioner (Hematology and Oncology)  Date of Service:  11/24/2020  CHIEF COMPLAINT: Follow up left breast cancer  SUMMARY OF ONCOLOGIC HISTORY: Oncology History Overview Note  Breast cancer of upper-outer quadrant of left female breast [facility])   Staging form: Breast, AJCC 7th Edition   - Clinical stage from 09/06/2016: Stage IIA (T1b, N1, M0) - Signed by Truitt Merle, MD on 09/15/2016   - Pathologic stage from 10/05/2016: Stage IIA (T1b, N1a, cM0) - Signed by Truitt Merle, MD on 10/24/2016    Breast cancer of upper-outer quadrant of left female breast ([place])  09/06/2016 Initial Diagnosis   Malignant neoplasm of upper-outer quadrant of right female breast ([place])   09/06/2016 Initial Biopsy   Left breast 3:00 position showed invasive ductal carcinoma and DCIS, left axillary lymph node biopsy showed metastatic carcinoma with extracapsular extension   09/06/2016 Receptors her2   Breast tumor ER 100% positive, PR 100% positive, HER-2 negative, Ki-67 5%   09/06/2016 Mammogram   Diagnostic mammogram and ultrasound showed a 1.0 cm mass at the 3 clock position of left breast, and the enlarged left axillary node with cortical thickening. The 62m cm oval cyst in the right breast is likely benign.   09/06/2016 Miscellaneous   Mammaprint showed low risk luminal A type, with average 10 year risk of recurrence untreated 10%.   09/16/2016 Genetic Testing   Genetic testing was normal, and did not reveal a deleterious mutation  One variant of uncertain significance (VUS) was found in the BRIP1 gene.  Genes tested include:  ATM, BARD1, BRCA1, BRCA2, BRIP1, CDH1, CHEK2, FANCC, MLH1, MSH2, MSH6, NBN, PALB2, PMS2, PTEN, RAD51C, RAD51D, TP53, and XRCC2.  This panel also includes deletion/duplication analysis (without sequencing) for one gene, EPCAM.     10/05/2016 Surgery   Left breast lumpectomy and SLN biopsy    10/05/2016 Pathology Results   Left breast lumpectomy showed invasive and in situ ductal carcinoma, 1 cm, grade 1, margins are negative, 2 sentinel lymph nodes are positive for metastatic carcinoma with extracapsular extension. LVI (+)   11/08/2016 - 12/24/2016 Radiation Therapy   Adjuvant radiation (Dr. MTobe Sos The Left breast was treated to 50.4 Gy in 28 fractions of 1.8 Gy per fraction. 2. The Left axilla was treated to 50.4 Gy in 28 fractions of 1.8 Gy per fraction.  3. The Left breast was boosted to 10 Gy in 5 fractions of 2 Gy per fraction.     01/10/2017 -  Anti-estrogen oral therapy   Exemestane 244mdaily   04/14/2017 Surgery   LAPAROSCOPIC BILATERAL SALPINGO OOPHORECTOMY by Dr. FoPamala Hurry 09/27/2017 Imaging    [facility] 09/27/17  IMPRESSION: 1. Small echogenic focus in the anterior right lobe of liver with diminished echogenicity peripherally. Possible atypical hemangioma but a metastatic deposit cannot be excluded. Consider CT or MRI of the abdomen to assess further. 2. No gallstones. 3. Portions of the head and tail of the pancreas are obscured by bowel gas.   10/14/2017 Imaging   MRI Abd W WO Contrast 10/14/17  IMPRESSION: 1. 8 by 4 mm lesion with signal and enhancement characteristics most typical for a small hemangioma in segment  IVb of the liver. No other candidate lesion to correspond with the sonographic finding. No MRI findings of suspicion for hepatic metastatic disease. 2. Small left adrenal adenoma. 3. Scattered descending colon diverticula.      CURRENT THERAPY:  Exemestane 25 mg Daily started 01/10/17, pt stopped in mid March 2019 due to insurance issue. Started letrozole  in 03/2018  INTERVAL HISTORY: *** Gloria Smith is here for a follow up of left breast cancer. She was last seen by me in 03/2018. She was seen by NP Lacie in interim, last was 6 months ago. She presents to the clinic alone.    REVIEW OF SYSTEMS:  *** Constitutional: Denies fevers, chills or abnormal weight loss Eyes: Denies blurriness of vision Ears, nose, mouth, throat, and face: Denies mucositis or sore throat Respiratory: Denies cough, dyspnea or wheezes Cardiovascular: Denies palpitation, chest discomfort or lower extremity swelling Gastrointestinal:  Denies nausea, heartburn or change in bowel habits Skin: Denies abnormal skin rashes Lymphatics: Denies new lymphadenopathy or easy bruising Neurological:Denies numbness, tingling or new weaknesses Behavioral/Psych: Mood is stable, no new changes  All other systems were reviewed with the patient and are negative.  MEDICAL HISTORY:  Past Medical History:  Diagnosis Date  . Anxiety   . Arthritis   . Depression   . Headache    ocular migraines - very rare  . Kidney infection   . Malignant neoplasm of upper-outer quadrant of left breast in female, estrogen receptor negative ([place]) 09/10/2016   pt states that cancer is on left breast, not right  . Urinary frequency     SURGICAL HISTORY: Past Surgical History:  Procedure Laterality Date  . BREAST LUMPECTOMY WITH RADIOACTIVE SEED AND SENTINEL LYMPH NODE BIOPSY Left 10/05/2016   Procedure: LEFT BREAST RADIOACTIVE SEED GUIDED LUMPECTOMY, LEFT AXILLARY RADIOACTIVE SEED GUIDED NODE EXCISION, LEFT AXILLARY SENTINEL LYMPH NODE BIOPSY(LEFT TARGETED AXILLARY DISSECTION);  Surgeon: Rolm Bookbinder, MD;  Location: [facility];  Service: General;  Laterality: Left;  . DENTAL SURGERY    . DILATION AND CURETTAGE OF UTERUS    . LAPAROSCOPIC BILATERAL SALPINGO OOPHERECTOMY Bilateral 04/14/2017   Procedure: LAPAROSCOPIC BILATERAL SALPINGO OOPHORECTOMY;  Surgeon: Aloha Gell, MD;  Location:  [facility];  Service: Gynecology;  Laterality: Bilateral;  . right knee surgery at age of 36       I have reviewed the social history and family history with the patient and they are unchanged from previous note.  ALLERGIES:  has No Known Allergies.  MEDICATIONS:  Current Outpatient Medications  Medication Sig Dispense Refill  . aspirin EC 81 MG tablet Take 81 mg by mouth daily. Swallow whole.    . exemestane (AROMASIN) 25 MG tablet Take 1 tablet (25 mg total) by mouth daily after breakfast. 90 tablet 3  . PARoxetine (PAXIL) 20 MG tablet Take 1 tablet (20 mg total) by mouth daily. 90 tablet 2   No current facility-administered medications for this visit.    PHYSICAL EXAMINATION: ECOG PERFORMANCE STATUS: {CHL ONC ECOG PS:[phone]}  There were no vitals filed for this visit. There were no vitals filed for this visit. *** GENERAL:alert, no distress and comfortable SKIN: skin color, texture, turgor are normal, no rashes or significant lesions EYES: normal, Conjunctiva are pink and non-injected, sclera clear {OROPHARYNX:no exudate, no erythema and lips, buccal mucosa, and tongue normal}  NECK: supple, thyroid normal size, non-tender, without nodularity LYMPH:  no palpable lymphadenopathy in the cervical, axillary {or inguinal} LUNGS: clear to auscultation and percussion with normal breathing effort HEART:  regular rate & rhythm and no murmurs and no lower extremity edema ABDOMEN:abdomen soft, non-tender and normal bowel sounds Musculoskeletal:no cyanosis of digits and no clubbing  NEURO: alert & oriented x 3 with fluent speech, no focal motor/sensory deficits  LABORATORY DATA:  I have reviewed the data as listed CBC Latest Ref Rng & Units 08/20/2020 05/20/2020 08/27/2019  WBC 4.0 - 10.5 K/uL 8.3 9.3 9.0  Hemoglobin 12.0 - 15.0 g/dL 14.1 14.7 14.1  Hematocrit 36.0 - 46.0 % 43.6 46.8(H) 44.0  Platelets 150 - 400 K/uL 464(H) 522(H) 466(H)     CMP Latest Ref Rng & Units 08/20/2020  05/20/2020 08/27/2019  Glucose 70 - 99 mg/dL 102(H) 117(H) 103(H)  BUN 6 - 20 mg/dL '12 18 15  ' Creatinine 0.44 - 1.00 mg/dL 0.84 0.99 0.88  Sodium 135 - 145 mmol/L 141 139 140  Potassium 3.5 - 5.1 mmol/L 4.3 4.6 4.8  Chloride 98 - 111 mmol/L 108 107 107  CO2 22 - 32 mmol/L '24 25 26  ' Calcium 8.9 - 10.3 mg/dL 9.1 9.4 9.4  Total Protein 6.5 - 8.1 g/dL 6.9 7.7 7.2  Total Bilirubin 0.3 - 1.2 mg/dL 0.3 0.2(L) <0.2(L)  Alkaline Phos 38 - 126 U/L 141(H) 162(H) 152(H)  AST 15 - 41 U/L 14(L) 15 16  ALT 0 - 44 U/L '25 31 27      ' RADIOGRAPHIC STUDIES: I have personally reviewed the radiological images as listed and agreed with the findings in the report. No results found.   ASSESSMENT & PLAN:  Raelle Chambers is a 50 y.o. female with   1. Breast cancer of upper-outer quadrant of left female breast, pT1bN1aM0 stage IIA, G1, ER+/PR+/HER2-, Ki67 5% -She was diagnosed    -I previously discussed her surgical pathology results with the patient in details. -She has had a complete surgical resections, margins were negative, she had 2 positive sentinel lymph nodes with extracapsular extension, (+) LVI which are risk for recurrence.  -We did mammaprint on her initial biopsy tumor tissue, which showed low risk disease, luminal type A. Her risk of recurrence in 10 years without adjuvant therapy is 10%. Her 5 year distant metastasis free survival is 97.8% with adjuvant antiestrogen therapy. Based on the mammaprint results, I do not recommend adjuvant chemotherapy. -She has started exemestane with ovarian suppression, now status post BSO. She is tolerating exemestane well, we'll continue for 7 years -Due to the tenderness in the right upper quadrant, and elevated alkaline phosphatase, I obtained an ultrasound of abdomen and liver MRI scans. -We disucssed her 09/2017 abdominal MRI and [place], these were only remarkable except for a small liver hemangioma.  -She is clinically doing well, lab results reviewed,  exam was unremarkable, no clinical concern for recurrence. -Continue breast cancer surveillance. Mammogram and Bone density Scan was [facility] at [facility] 08/2017 were normal.  -Slightly elevated Alkaline Phosphatase level in the past 3 months, overall mild and stable, GGT was normal. I will continue monitoring the lab, if alkaline phosphatase at increase this further, I will obtain a bone scan to rule out bony metastasis. -She continues to have elevated platelets (431 today, 03/24/18) but improved. She does not have any chronic illnesses, we will continue to monitor this closely with repeat labs.  -She stopped exemestane in March 2019, when she lost her insurance. -I will switch her to letrozole 2.5 mg daily, I gave her bottle of sample today.  I strongly encouraged her to continue antiestrogen therapy due to risk of recurrence.  2.  Thrombocytosis, polycythemia -She has had stable thrombocytosis 400-480 since 2017, no previous work-up.  Denies history of chronic infection, splenomegaly -Platelet count today slightly elevated to 522 with new elevated RBC 5.26 and hematocrit 46.8.  Normal hemoglobin, no other abnormal blood counts -This is possibly reactive/secondary from former smoking history. She also snores at night, has not had formal sleep study. -I recommend referral to pulmonary for sleep study to r/o out sleep apnea as potential cause for secondary polycythemia -will check epo and Jak 2 to r/o PV -will f/u with her with results.   3. Depression  -she presented on 08/27/19 with tearfulness, lacking motivation, weight gain, poor sleep, negative body and intimacy issues. She admits to having depression. Denied SI/HI -she was previously on Paxil for hot flashes which helped vasomotor symptoms.  She restarted on 08/27/2019 with significant improvement in her depressed mood and other symptoms.   -Mood much improved today  -I refilled Paxil, continue current dose  4. Genetics  -Giving her young  age and positive family history of breast cancer, she'll be referred to genetic counselor for genetic testing, to ruled out inheritable breast cancer syndrome. -Negativefor known pathogenic mutations within any of 20 genes on the Breast/Ovarian Cancer Panel through [employer]. One variant of uncertain significance (VUS) called "[id number] (p.Ile1191Val)" was found in one copy of the BRIP1gene.   5. Hot flash  - secondary to BSO and exemestane, mild and tolerable -Hot flashes increased last week when she ran out of Paxil  6. Bone Health -Normal DEXA in 08/2017 -We discussed menopause and exemestane can weaken her bone. -Order placed to repeat DEXA this year  7. Health maintenance -she is requesting referral to a new PCP -I encouraged her to continue annual health f/u and routine labs -She has made positive lifestyle changes including switch to a plant-based diet and losing weight intentionally  -She is a former smoker but restarted last week when she ran out of Paxil.  She did does not plan to continue smoking.   -New age to begin screening colonoscopy is 29  PLAN: ***  -CBC, CMP reviewed -Add on lab epo and jak 2 to r/o PV -referral to pulm for sleep study -Diagnostic bilat mammo for breast pain and palpable densities  -DEXA due now, can schedule with mammogram  -Refill paxil and aromasin -Phone call with results  -Repeat lab in 3 and 6 months -If work up is negative, f/u in 6 months for routine breast cancer surveillance   No problem-specific Assessment & Plan notes found for this encounter.   No orders of the defined types were placed in this encounter.  All questions were answered. The patient knows to call the clinic with any problems, questions or concerns. No barriers to learning was detected. The total time spent in the appointment was {CHL ONC TIME VISIT - JKQAS:[id number]}.     Gloria Smith 11/24/2020   Oneal Deputy, am acting as scribe for Truitt Merle, MD.   {Add scribe attestation statement}

## 2020-11-24 NOTE — Telephone Encounter (Signed)
Rescheduled appointment per 1/3 schedule message. Patient is aware of changes. 

## 2020-12-01 NOTE — Progress Notes (Signed)
[place]   [phone] [phone]) [phone]   Clinic Follow up Note   Patient Care Team: Martinique, Betty G, MD as PCP - General (Family Medicine) Kyung Rudd, MD as Consulting Physician (Radiation Oncology) Truitt Merle, MD as Consulting Physician (Hematology) Rolm Bookbinder, MD as Consulting Physician (General Surgery) Gardenia Phlegm, NP as Nurse Practitioner (Hematology and Oncology)  Date of Service:  12/02/2020  CHIEF COMPLAINT: Follow up left breast cancer  SUMMARY OF ONCOLOGIC HISTORY: Oncology History Overview Note  Breast cancer of upper-outer quadrant of left female breast [facility])   Staging form: Breast, AJCC 7th Edition   - Clinical stage from 09/06/2016: Stage IIA (T1b, N1, M0) - Signed by Truitt Merle, MD on 09/15/2016   - Pathologic stage from 10/05/2016: Stage IIA (T1b, N1a, cM0) - Signed by Truitt Merle, MD on 10/24/2016    Breast cancer of upper-outer quadrant of left female breast ([place])  09/06/2016 Initial Diagnosis   Malignant neoplasm of upper-outer quadrant of right female breast ([place])   09/06/2016 Initial Biopsy   Left breast 3:00 position showed invasive ductal carcinoma and DCIS, left axillary lymph node biopsy showed metastatic carcinoma with extracapsular extension   09/06/2016 Receptors her2   Breast tumor ER 100% positive, PR 100% positive, HER-2 negative, Ki-67 5%   09/06/2016 Mammogram   Diagnostic mammogram and ultrasound showed a 1.0 cm mass at the 3 clock position of left breast, and the enlarged left axillary node with cortical thickening. The 50m cm oval cyst in the right breast is likely benign.   09/06/2016 Miscellaneous   Mammaprint showed low risk luminal A type, with average 10 year risk of recurrence untreated 10%.   09/16/2016 Genetic Testing   Genetic testing was normal, and did not reveal a deleterious mutation  One variant of uncertain significance (VUS) was found in the BRIP1 gene.  Genes tested include:  ATM, BARD1, BRCA1, BRCA2, BRIP1, CDH1, CHEK2, FANCC, MLH1, MSH2, MSH6, NBN, PALB2, PMS2, PTEN, RAD51C, RAD51D, TP53, and XRCC2.  This panel also includes deletion/duplication analysis (without sequencing) for one gene, EPCAM.     10/05/2016 Surgery   Left breast lumpectomy and SLN biopsy    10/05/2016 Pathology Results   Left breast lumpectomy showed invasive and in situ ductal carcinoma, 1 cm, grade 1, margins are negative, 2 sentinel lymph nodes are positive for metastatic carcinoma with extracapsular extension. LVI (+)   11/08/2016 - 12/24/2016 Radiation Therapy   Adjuvant radiation (Dr. MTobe Sos The Left breast was treated to 50.4 Gy in 28 fractions of 1.8 Gy per fraction. 2. The Left axilla was treated to 50.4 Gy in 28 fractions of 1.8 Gy per fraction.  3. The Left breast was boosted to 10 Gy in 5 fractions of 2 Gy per fraction.     01/10/2017 -  Anti-estrogen oral therapy   Exemestane 25 mg Daily started 01/10/17, pt stopped in mid March 2019 due to insurance issue. She restarted Exemestane in 2019.    04/14/2017 Surgery   LAPAROSCOPIC BILATERAL SALPINGO OOPHORECTOMY by Dr. FPamala Hurry  09/27/2017 Imaging    [place]d 09/27/17  IMPRESSION: 1. Small echogenic focus in the anterior right lobe of liver with diminished echogenicity peripherally. Possible atypical hemangioma but a metastatic deposit cannot be excluded. Consider CT or MRI of the abdomen to assess further. 2. No gallstones. 3. Portions of the head and tail of the pancreas are obscured by bowel gas.   10/14/2017 Imaging   MRI Abd W WO Contrast 10/14/17  IMPRESSION:  1. 8 by 4 mm lesion with signal and enhancement characteristics most typical for a small hemangioma in segment IVb of the liver. No other candidate lesion to correspond with the sonographic finding. No MRI findings of suspicion for hepatic metastatic disease. 2. Small left adrenal adenoma. 3. Scattered descending colon diverticula.     CURRENT THERAPY:   Exemestane 25 mg Daily started 01/10/17, pt stopped in mid March 2019 due to insurance issue. She restarted Exemestane in 2019.   INTERVAL HISTORY:  Gloria Smith is here for a follow up. She was last seen by me in 03/2018 and seen by NP Lacie in interim, last in 04/2020. She presents to the clinic alone. She notes she is doing well. She notes she was able to pay for exemestane so she is still taking it. She is tolerating well. She notes she is gaining weight. She does walk with her dogs but nothing more. She is still doing well on Paxil.    She notes she has had her COVID booster.    REVIEW OF SYSTEMS:   Constitutional: Denies fevers, chills or abnormal weight loss (+) weight gain Eyes: Denies blurriness of vision Ears, nose, mouth, throat, and face: Denies mucositis or sore throat Respiratory: Denies cough, dyspnea or wheezes Cardiovascular: Denies palpitation, chest discomfort or lower extremity swelling Gastrointestinal:  Denies nausea, heartburn or change in bowel habits Skin: Denies abnormal skin rashes Lymphatics: Denies new lymphadenopathy or easy bruising Neurological:Denies numbness, tingling or new weaknesses Behavioral/Psych: Mood is stable, no new changes  All other systems were reviewed with the patient and are negative.  MEDICAL HISTORY:  Past Medical History:  Diagnosis Date  . Anxiety   . Arthritis   . Depression   . Headache    ocular migraines - very rare  . Kidney infection   . Malignant neoplasm of upper-outer quadrant of left breast in female, estrogen receptor negative ([place]) 09/10/2016   pt states that cancer is on left breast, not right  . Urinary frequency     SURGICAL HISTORY: Past Surgical History:  Procedure Laterality Date  . BREAST LUMPECTOMY WITH RADIOACTIVE SEED AND SENTINEL LYMPH NODE BIOPSY Left 10/05/2016   Procedure: LEFT BREAST RADIOACTIVE SEED GUIDED LUMPECTOMY, LEFT AXILLARY RADIOACTIVE SEED GUIDED NODE EXCISION, LEFT AXILLARY  SENTINEL LYMPH NODE BIOPSY(LEFT TARGETED AXILLARY DISSECTION);  Surgeon: Rolm Bookbinder, MD;  Location: [facility];  Service: General;  Laterality: Left;  . DENTAL SURGERY    . DILATION AND CURETTAGE OF UTERUS    . LAPAROSCOPIC BILATERAL SALPINGO OOPHERECTOMY Bilateral 04/14/2017   Procedure: LAPAROSCOPIC BILATERAL SALPINGO OOPHORECTOMY;  Surgeon: Aloha Gell, MD;  Location: [facility];  Service: Gynecology;  Laterality: Bilateral;  . right knee surgery at age of 19       I have reviewed the social history and family history with the patient and they are unchanged from previous note.  ALLERGIES:  has No Known Allergies.  MEDICATIONS:  Current Outpatient Medications  Medication Sig Dispense Refill  . aspirin EC 81 MG tablet Take 81 mg by mouth daily. Swallow whole.    . exemestane (AROMASIN) 25 MG tablet Take 1 tablet (25 mg total) by mouth daily after breakfast. 90 tablet 3  . PARoxetine (PAXIL) 20 MG tablet Take 1 tablet (20 mg total) by mouth daily. 90 tablet 2   No current facility-administered medications for this visit.    PHYSICAL EXAMINATION: ECOG PERFORMANCE STATUS: 0 - Asymptomatic  Vitals:   12/02/20 0816  BP: 110/66  Pulse: 90  Resp: 18  Temp: (!) 95.3 F (35.2 C)  SpO2: 97%   Filed Weights   12/02/20 0816  Weight: 201 lb 11.2 oz (91.5 kg)    GENERAL:alert, no distress and comfortable SKIN: skin color, texture, turgor are normal, no rashes or significant lesions EYES: normal, Conjunctiva are pink and non-injected, sclera clear  NECK: supple, thyroid normal size, non-tender, without nodularity LYMPH:  no palpable lymphadenopathy in the cervical, axillary  LUNGS: clear to auscultation and percussion with normal breathing effort HEART: regular rate & rhythm and no murmurs and no lower extremity edema ABDOMEN:abdomen soft, non-tender and normal bowel sounds Musculoskeletal:no cyanosis of digits and no clubbing  NEURO: alert & oriented x 3 with fluent speech, no  focal motor/sensory deficits BREAST: s/p left lumpectomy: Surgical incision healed well with mild scar tissue (+) known mild right breast lump, benign  LABORATORY DATA:  I have reviewed the data as listed CBC Latest Ref Rng & Units 12/02/2020 08/20/2020 05/20/2020  WBC 4.0 - 10.5 K/uL 9.5 8.3 9.3  Hemoglobin 12.0 - 15.0 g/dL 14.4 14.1 14.7  Hematocrit 36.0 - 46.0 % 44.6 43.6 46.8(H)  Platelets 150 - 400 K/uL 486(H) 464(H) 522(H)     CMP Latest Ref Rng & Units 12/02/2020 08/20/2020 05/20/2020  Glucose 70 - 99 mg/dL 110(H) 102(H) 117(H)  BUN 6 - 20 mg/dL '15 12 18  ' Creatinine 0.44 - 1.00 mg/dL 0.92 0.84 0.99  Sodium 135 - 145 mmol/L 141 141 139  Potassium 3.5 - 5.1 mmol/L 4.0 4.3 4.6  Chloride 98 - 111 mmol/L 107 108 107  CO2 22 - 32 mmol/L '23 24 25  ' Calcium 8.9 - 10.3 mg/dL 9.6 9.1 9.4  Total Protein 6.5 - 8.1 g/dL 7.7 6.9 7.7  Total Bilirubin 0.3 - 1.2 mg/dL 0.6 0.3 0.2(L)  Alkaline Phos 38 - 126 U/L 175(H) 141(H) 162(H)  AST 15 - 41 U/L 14(L) 14(L) 15  ALT 0 - 44 U/L 34 25 31      RADIOGRAPHIC STUDIES: I have personally reviewed the radiological images as listed and agreed with the findings in the report. No results found.   ASSESSMENT & PLAN:  Gloria Smith is a 50 y.o. female with    1. Breast cancer of upper-outer quadrant of left female breast, pT1bN1aM0 stage IIA, G1, ER+/PR+/HER2-, Ki67 5% -She was diagnosed in 08/2016 with left breast invasive ductal carcinoma metastatic to LN. She was treated with Left lumpectomy and adjuvant radiation. Given low risk luminal A type Mammaprint, chemotherapy was not recommended.  -She started anti-estrogen therapy with exemestane in 22018, stopped in mid March 2019 due to insurance issue. She was able to pay and restarted exemestane in 2019.  -She is clinically doing well. Lab reviewed, her CBC and CMP are within normal limits except plt 486K. Her physical exam was unremarkable. There is no clinical concern for  recurrence. -Continue surveillance and Exemestane.  -F/u with NP Laice in 6 months.   2. Thrombocytosis, polycythemia -She has had stable thrombocytosis 400-480 since 2017, no previous work-up.  Denies history of chronic infection, splenomegaly -This is possibly reactive/secondary from former smoking history. She also snores at night, has not had formal sleep study. -she was previously referred to pulmonary for sleep study to r/o out sleep apnea as potential cause for secondary polycythemia -MPN panel including JAK2 mutation was negative, EPO level was normal  -overall stable, will monitor   3. Depression  -she presented on 08/27/19 with tearfulness, lacking motivation, weight gain, poor sleep, negative  body and intimacy issues. She admits to having depression. Denied SI/HI -Improved on Paxil, will continue.   4. Genetics testing negative for pathogenetic mutations.   5. Bone Health -Normal DEXA in 08/2017 and on 07/10/20 DEXA.  -We discussed menopause and exemestane weaks her bone.  -I encouraged her to continue multivitamin and exercise.   6. Health maintenance -Her 2021 colonoscopy showed benign precancerous polyps that were removed.  -She has gained weight with the help of antiestrogen therapy. I reviewed weight management with her including increased exercise and low carb diet.    PLAN -Continue Exemestane  -Lab and f/u with NP Lacie in 6 months    No problem-specific Assessment & Plan notes found for this encounter.   No orders of the defined types were placed in this encounter.  All questions were answered. The patient knows to call the clinic with any problems, questions or concerns. No barriers to learning was detected. The total time spent in the appointment was 25 minutes.     Truitt Merle, MD 12/02/2020   I, Joslyn Devon, am acting as scribe for Truitt Merle, MD.   I have reviewed the above documentation for accuracy and completeness, and I agree with the above.

## 2020-12-02 ENCOUNTER — Encounter: Payer: Self-pay | Admitting: Hematology

## 2020-12-02 ENCOUNTER — Other Ambulatory Visit: Payer: Self-pay

## 2020-12-02 ENCOUNTER — Inpatient Hospital Stay: Payer: 59

## 2020-12-02 ENCOUNTER — Inpatient Hospital Stay: Payer: 59 | Attending: Hematology | Admitting: Hematology

## 2020-12-02 VITALS — BP 110/66 | HR 90 | Temp 95.3°F | Resp 18 | Ht 64.0 in | Wt 201.7 lb

## 2020-12-02 DIAGNOSIS — D75839 Thrombocytosis, unspecified: Secondary | ICD-10-CM

## 2020-12-02 DIAGNOSIS — C50412 Malignant neoplasm of upper-outer quadrant of left female breast: Secondary | ICD-10-CM

## 2020-12-02 DIAGNOSIS — F32A Depression, unspecified: Secondary | ICD-10-CM | POA: Insufficient documentation

## 2020-12-02 DIAGNOSIS — Z170 Estrogen receptor positive status [ER+]: Secondary | ICD-10-CM | POA: Diagnosis not present

## 2020-12-02 DIAGNOSIS — D751 Secondary polycythemia: Secondary | ICD-10-CM | POA: Diagnosis not present

## 2020-12-02 DIAGNOSIS — Z79811 Long term (current) use of aromatase inhibitors: Secondary | ICD-10-CM | POA: Diagnosis not present

## 2020-12-02 DIAGNOSIS — C50411 Malignant neoplasm of upper-outer quadrant of right female breast: Secondary | ICD-10-CM | POA: Insufficient documentation

## 2020-12-02 LAB — COMPREHENSIVE METABOLIC PANEL
ALT: 34 U/L (ref 0–44)
AST: 14 U/L — ABNORMAL LOW (ref 15–41)
Albumin: 3.7 g/dL (ref 3.5–5.0)
Alkaline Phosphatase: 175 U/L — ABNORMAL HIGH (ref 38–126)
Anion gap: 11 (ref 5–15)
BUN: 15 mg/dL (ref 6–20)
CO2: 23 mmol/L (ref 22–32)
Calcium: 9.6 mg/dL (ref 8.9–10.3)
Chloride: 107 mmol/L (ref 98–111)
Creatinine, Ser: 0.92 mg/dL (ref 0.44–1.00)
GFR, Estimated: >60 mL/min (ref >60)
Glucose, Bld: 110 mg/dL — ABNORMAL HIGH (ref 70–99)
Potassium: 4 mmol/L (ref 3.5–5.1)
Sodium: 141 mmol/L (ref 135–145)
Total Bilirubin: 0.6 mg/dL (ref 0.3–1.2)
Total Protein: 7.7 g/dL (ref 6.5–8.1)

## 2020-12-02 LAB — CBC WITH DIFFERENTIAL/PLATELET
Abs Immature Granulocytes: 0.03 10*3/uL (ref 0.00–0.07)
Basophils Absolute: 0.1 10*3/uL (ref 0.0–0.1)
Basophils Relative: 1 %
Eosinophils Absolute: 0.3 10*3/uL (ref 0.0–0.5)
Eosinophils Relative: 3 %
HCT: 44.6 % (ref 36.0–46.0)
Hemoglobin: 14.4 g/dL (ref 12.0–15.0)
Immature Granulocytes: 0 %
Lymphocytes Relative: 25 %
Lymphs Abs: 2.4 10*3/uL (ref 0.7–4.0)
MCH: 28.3 pg (ref 26.0–34.0)
MCHC: 32.3 g/dL (ref 30.0–36.0)
MCV: 87.6 fL (ref 80.0–100.0)
Monocytes Absolute: 0.7 10*3/uL (ref 0.1–1.0)
Monocytes Relative: 7 %
Neutro Abs: 6.1 10*3/uL (ref 1.7–7.7)
Neutrophils Relative %: 64 %
Platelets: 486 10*3/uL — ABNORMAL HIGH (ref 150–400)
RBC: 5.09 MIL/uL (ref 3.87–5.11)
RDW: 13 % (ref 11.5–15.5)
WBC: 9.5 10*3/uL (ref 4.0–10.5)
nRBC: 0 % (ref 0.0–0.2)

## 2020-12-02 LAB — IRON AND TIBC
Iron: 121 ug/dL (ref 41–142)
Saturation Ratios: 38 % (ref 21–57)
TIBC: 320 ug/dL (ref 236–444)
UIBC: 199 ug/dL (ref 120–384)

## 2020-12-02 LAB — FERRITIN: Ferritin: 95 ng/mL (ref 11–307)

## 2020-12-03 ENCOUNTER — Telehealth: Payer: Self-pay | Admitting: Nurse Practitioner

## 2020-12-03 NOTE — Telephone Encounter (Signed)
Scheduled appts per 1/11 los. Left voicemail with appt date and time.

## 2021-01-22 ENCOUNTER — Telehealth: Payer: Self-pay | Admitting: Nurse Practitioner

## 2021-01-22 NOTE — Telephone Encounter (Signed)
Left message with moved upcoming appointment due to provider's template. Gave option to call back to reschedule if needed. 

## 2021-03-05 ENCOUNTER — Telehealth (INDEPENDENT_AMBULATORY_CARE_PROVIDER_SITE_OTHER): Payer: 59 | Admitting: Family Medicine

## 2021-03-05 DIAGNOSIS — E669 Obesity, unspecified: Secondary | ICD-10-CM | POA: Diagnosis not present

## 2021-03-05 DIAGNOSIS — R519 Headache, unspecified: Secondary | ICD-10-CM | POA: Diagnosis not present

## 2021-03-05 DIAGNOSIS — R52 Pain, unspecified: Secondary | ICD-10-CM

## 2021-03-05 DIAGNOSIS — R067 Sneezing: Secondary | ICD-10-CM

## 2021-03-05 NOTE — Progress Notes (Signed)
Virtual Visit via Video Note  I connected with Gloria Smith  on 03/05/21 at 10:00 AM EDT by a video enabled telemedicine application and verified that I am speaking with the correct person using two identifiers.  Location patient: home, [place] Location provider:work or home office Persons participating in the virtual visit: patient, provider  I discussed the limitations of evaluation and management by telemedicine and the availability of in person appointments. The patient expressed understanding and agreed to proceed.   HPI:  Acute telemedicine visit for flu like symptoms: -Onset: about 1 week ago -Symptoms include: head feels full, sinus pressure, mild headache, fatigue, feels hot sometimes, body aches, stuffy nose, sneezing, had some loose bowels -did one at home covid test when this initially started, did another covid test yesterday which was negative, known tick bites or exposures to areas with ticks - feeling a little better this morning -Denies: fevers, vomiting, worst headache, CP, SOB, joint swelling, rashes, known sick contacts (boss is sick), inability to eat/drink/get out of bed  -Pertinent past medical history: hx of breast ca; seasonal allergies -Pertinent medication allergies: nkda -COVID-19 vaccine status: had covid vaccines + booster and had flu shot -She has been working on various diets for her weight, currently trying a very low carb diet, Atkins diet.  She also wants something that is safe for cancer history.  ROS: See pertinent positives and negatives per HPI.  Past Medical History:  Diagnosis Date  . Anxiety   . Arthritis   . Depression   . Headache    ocular migraines - very rare  . Kidney infection   . Malignant neoplasm of upper-outer quadrant of left breast in female, estrogen receptor negative ([place]) 09/10/2016   pt states that cancer is on left breast, not right  . Urinary frequency     Past Surgical History:  Procedure Laterality Date  . BREAST LUMPECTOMY  WITH RADIOACTIVE SEED AND SENTINEL LYMPH NODE BIOPSY Left 10/05/2016   Procedure: LEFT BREAST RADIOACTIVE SEED GUIDED LUMPECTOMY, LEFT AXILLARY RADIOACTIVE SEED GUIDED NODE EXCISION, LEFT AXILLARY SENTINEL LYMPH NODE BIOPSY(LEFT TARGETED AXILLARY DISSECTION);  Surgeon: Rolm Bookbinder, MD;  Location: [facility];  Service: General;  Laterality: Left;  . DENTAL SURGERY    . DILATION AND CURETTAGE OF UTERUS    . LAPAROSCOPIC BILATERAL SALPINGO OOPHERECTOMY Bilateral 04/14/2017   Procedure: LAPAROSCOPIC BILATERAL SALPINGO OOPHORECTOMY;  Surgeon: Aloha Gell, MD;  Location: [facility];  Service: Gynecology;  Laterality: Bilateral;  . right knee surgery at age of 63        Current Outpatient Medications:  .  aspirin EC 81 MG tablet, Take 81 mg by mouth daily. Swallow whole., Disp: , Rfl:  .  exemestane (AROMASIN) 25 MG tablet, Take 1 tablet (25 mg total) by mouth daily after breakfast., Disp: 90 tablet, Rfl: 3 .  PARoxetine (PAXIL) 20 MG tablet, Take 1 tablet (20 mg total) by mouth daily., Disp: 90 tablet, Rfl: 2  EXAM:  VITALS per patient if applicable:  GENERAL: alert, oriented, appears well and in no acute distress  HEENT: atraumatic, conjunttiva clear, no obvious abnormalities on inspection of external nose and ears  NECK: normal movements of the head and neck  LUNGS: on inspection no signs of respiratory distress, breathing rate appears normal, no obvious gross SOB, gasping or wheezing  CV: no obvious cyanosis  MS: moves all visible extremities without noticeable abnormality  PSYCH/NEURO: pleasant and cooperative, no obvious depression or anxiety, speech and thought processing grossly intact  ASSESSMENT AND PLAN:  Discussed the following assessment and plan:  Body aches  Nonintractable headache, unspecified chronicity pattern, unspecified headache type  Sneezing  Obesity (BMI 30-39.9)  -we discussed possible serious and likely etiologies, options for evaluation and workup,  limitations of telemedicine visit vs in person visit, treatment, treatment risks and precautions. Pt prefers to treat via telemedicine empirically rather than in person at this moment.  Query possible viral infection, possible influenza, possible Covid with false negative testing versus other.  She feels she may be starting to feel little better today.  She opted for oral intake of fluids, nasal saline and observation for another day or 2, unless worsening.  As long as improving, suspect was a virus.  Advised if any worsening or not continuing to improve that she will need in person evaluation. We also had a lengthy discussion about nutrition and diet.  Discussed healthy diets, likely low in dairy and meat would be better for the cancer history.  Discussed [employer] plant-based diets, Mediterranean diet is another healthy diet. Work/School slipped offered:  declined Scheduled follow up with PCP offered: Agrees to call if needed. Advised to seek prompt in person care if worsening, new symptoms arise, or if is not improving with treatment. Discussed options for inperson care if PCP office not available. Did let this patient know that I only do telemedicine on Tuesdays and Thursdays for [facility]. Advised to schedule follow up visit with PCP or [facility] if any further questions or concerns to avoid delays in care.   I discussed the assessment and treatment plan with the patient. The patient was provided an opportunity to ask questions and all were answered. The patient agreed with the plan and demonstrated an understanding of the instructions.     Lucretia Kern, DO

## 2021-03-05 NOTE — Patient Instructions (Signed)
  HOME CARE TIPS:  -can use nasal saline a few times per day   -stay hydrated, drink plenty of fluids and eat small healthy meals - avoid dairy  -can take 1000 IU (58mcg) Vit D3 and 100-500 mg of Vit C daily per instructions  -follow up with your doctor or seek inperson care if not feeling better over the next few days  -stay home while sick, except to seek medical care, and if you have COVID19 ideally it would be best to stay home for a full 10 days since the onset of symptoms PLUS one day of no fever and feeling better. Wear a good mask (such as N95 or KN95) if around others to reduce the risk of transmission.  It was nice to meet you today, and I really hope you are feeling better soon. I help [facility] out with telemedicine visits on Tuesdays and Thursdays and am available for visits on those days. If you have any concerns or questions following this visit please schedule a follow up visit with your Primary Care doctor or seek care at a local urgent care clinic to avoid delays in care.    Seek in person care or schedule a follow up video visit promptly if your symptoms worsen, new concerns arise or you are not improving with treatment. Call 911 and/or seek emergency care if your symptoms are severe or life threatening.

## 2021-05-12 ENCOUNTER — Encounter: Payer: Self-pay | Admitting: Emergency Medicine

## 2021-05-12 ENCOUNTER — Telehealth: Payer: 59 | Admitting: Emergency Medicine

## 2021-05-12 DIAGNOSIS — U071 COVID-19: Secondary | ICD-10-CM | POA: Diagnosis not present

## 2021-05-12 MED ORDER — MOLNUPIRAVIR EUA 200MG CAPSULE: 4.0000 | ORAL_CAPSULE | Freq: Two times a day (BID) | ORAL | 0 refills | Status: AC

## 2021-05-12 NOTE — Progress Notes (Signed)
Ms. clark, clowdus are scheduled for a virtual visit with your provider today.    Just as we do with appointments in the office, we must obtain your consent to participate.  Your consent will be active for this visit and any virtual visit you may have with one of our providers in the next 365 days.    If you have a MyChart account, I can also send a copy of this consent to you electronically.  All virtual visits are billed to your insurance company just like a traditional visit in the office.  As this is a virtual visit, video technology does not allow for your provider to perform a traditional examination.  This may limit your provider's ability to fully assess your condition.  If your provider identifies any concerns that need to be evaluated in person or the need to arrange testing such as labs, EKG, etc, we will make arrangements to do so.    Although advances in technology are sophisticated, we cannot ensure that it will always work on either your end or our end.  If the connection with a video visit is poor, we may have to switch to a telephone visit.  With either a video or telephone visit, we are not always able to ensure that we have a secure connection.   I need to obtain your verbal consent now.   Are you willing to proceed with your visit today?   Gloria Smith has provided verbal consent on 05/12/2021 for a virtual visit (video or telephone).   Gloria Gens, PA-C 05/12/2021  8:37 AM   Date:  05/12/2021   ID:  Gloria Smith, DOB [birth year], MRN [id number]  Patient Location: Home Provider Location: Home Office   Participants: Patient and Provider for Visit and Wrap up  Method of visit: Video  Location of Patient: Home Location of Provider: Home Office Consent was obtain for visit over the video. Services rendered by provider: Visit was performed via video  A video enabled telemedicine application was used and I verified that I am speaking with the correct person  using two identifiers.  PCP:  Smith, Gloria G, MD   Chief Complaint:  covid positive   History of Present Illness:    Gloria Smith is a 50 y.o. female with history as stated below. Presents video telehealth for an acute care visit  Onset of symptoms was Saturday 05/09/21 and symptoms have been persistent and include: generalized HA, body aches, nasal congestion, sore throat, cough, fever Tmax 101.3*F with chills.  Last temp was 99.8*F this morning.  Decreased appetite.  [facility] pain reliever- acetaminophen.  Mild shortness of breath with exertion but fine at this time.  Returned from the [place] on Thursday, 05/07/21.   She had her [employer], with booster in December 2021.     Denies having chest pain, ear pain, or exposure to covid or other sick contacts. Denies nausea, vomiting or diarrhea.   Modifying factors include: acetaminophen, temporary relief.    No other aggravating or relieving factors.  No other c/o.  The patient does have symptoms concerning for COVID-19 infection (fever, chills, cough, or new shortness of breath).  Patient has been tested for COVID during this illness, positive yesterday 05/11/21.  Past Medical, Surgical, Social History, Allergies, and Medications have been Reviewed.  Patient Active Problem List   Diagnosis Date Noted   Snoring 06/04/2020   Genetic testing 10/04/2016   Breast cancer of upper-outer quadrant of left female breast ([place])  09/10/2016    Social History   Tobacco Use   Smoking status: Former    Packs/day: 0.50    Years: 30.00    Pack years: 15.00    Types: Cigarettes    Quit date: 08/22/2016    Years since quitting: 4.7   Smokeless tobacco: Never  Substance Use Topics   Alcohol use: Yes    Alcohol/week: 1.0 standard drink    Types: 1 Glasses of wine per week    Comment: 6 drinks wine or beer a week      Current Outpatient Medications:    molnupiravir EUA 200 mg CAPS, Take 4 capsules (800 mg total) by mouth 2 (two) times  daily for 5 days., Disp: 40 capsule, Rfl: 0   aspirin EC 81 MG tablet, Take 81 mg by mouth daily. Swallow whole., Disp: , Rfl:    exemestane (AROMASIN) 25 MG tablet, Take 1 tablet (25 mg total) by mouth daily after breakfast., Disp: 90 tablet, Rfl: 3   PARoxetine (PAXIL) 20 MG tablet, Take 1 tablet (20 mg total) by mouth daily., Disp: 90 tablet, Rfl: 2   No Known Allergies   ROS See HPI for history of present illness.  Physical Exam Constitutional:      General: She is not in acute distress.    Appearance: Normal appearance. She is not ill-appearing, toxic-appearing or diaphoretic.     Comments: Resting comfortably in bed, alert and cooperative during video visit.   HENT:     Head: Normocephalic.     Nose: Congestion present.  Eyes:     Extraocular Movements: Extraocular movements intact.  Pulmonary:     Effort: Pulmonary effort is normal. No respiratory distress.  Musculoskeletal:     Cervical back: Normal range of motion.  Neurological:     Mental Status: She is alert.  Psychiatric:        Mood and Affect: Mood normal.        Behavior: Behavior normal.              A&P  COVID  -molnupiravir 800mg  twice daily for 5 days  -continue acetaminophen and take ibuprofen as needed for fever and pain  -encouraged fluids and rest  -follow up with PCP if not improving in 3-4 days  -discussed signs/symptoms to seek urgent/emergent in-person evaluation.    Patient voiced understanding and agreement to plan.   Time:   Today, I have spent 15 minutes with the patient with telehealth technology discussing the above problems, reviewing the chart, previous notes, medications and orders.    Tests Ordered: No orders of the defined types were placed in this encounter.   Medication Changes: Meds ordered this encounter  Medications   molnupiravir EUA 200 mg CAPS    Sig: Take 4 capsules (800 mg total) by mouth 2 (two) times daily for 5 days.    Dispense:  40 capsule    Refill:  0      Disposition:  Follow up later this week with PCP as needed. Discussed signs/symptoms for urgent/emergent treatment.   Etta Grandchild, PA-C  05/12/2021 8:37 AM

## 2021-05-14 ENCOUNTER — Telehealth: Payer: Self-pay

## 2021-05-14 NOTE — Telephone Encounter (Signed)
Attempted to contact patient in regards to [employer]. Left vm for patient to callback and will also send a mychart message with protocol.

## 2021-05-15 ENCOUNTER — Encounter (INDEPENDENT_AMBULATORY_CARE_PROVIDER_SITE_OTHER): Payer: Self-pay

## 2021-05-15 ENCOUNTER — Telehealth: Payer: Self-pay

## 2021-05-15 NOTE — Telephone Encounter (Signed)
Patient states that she had sob from coughing so much. Patient feels better this morning. Patient advised on protocol for sob as follows:   Shortness of breath is the same: continue to monitor at home   If symptoms become severe, i.e. shortness of breath at rest, gasping for air, wheezing, CALL [place] ED.  Patient verbalized understanding and will continue to monitor.

## 2021-05-19 ENCOUNTER — Telehealth: Payer: Self-pay

## 2021-05-19 NOTE — Telephone Encounter (Signed)
Called patient, no answer. VM left for patient to callback regarding questionnaire. Will also send mychat message.

## 2021-05-31 NOTE — Progress Notes (Signed)
[facility]   [phone] [phone]   Clinic Follow up Note   Patient Care Team: [place], Betty G, MD as PCP - General (Family Medicine) Dorothy Puffer, MD as Consulting Physician (Radiation Oncology) Malachy Mood, MD as Consulting Physician (Hematology) Emelia Loron, MD as Consulting Physician (General Surgery) Loa Socks, NP as Nurse Practitioner (Hematology and Oncology) 06/02/2021  CHIEF COMPLAINT: Follow up left breast center   SUMMARY OF ONCOLOGIC HISTORY: Oncology History Overview Note  Breast cancer of upper-outer quadrant of left female breast [facility])   Staging form: Breast, AJCC 7th Edition   - Clinical stage from 09/06/2016: Stage IIA (T1b, N1, M0) - Signed by Malachy Mood, MD on 09/15/2016   - Pathologic stage from 10/05/2016: Stage IIA (T1b, N1a, cM0) - Signed by Malachy Mood, MD on 10/24/2016     Breast cancer of upper-outer quadrant of left female breast (HCC)  09/06/2016 Initial Diagnosis   Malignant neoplasm of upper-outer quadrant of right female breast (HCC)    09/06/2016 Initial Biopsy   Left breast 3:00 position showed invasive ductal carcinoma and DCIS, left axillary lymph node biopsy showed metastatic carcinoma with extracapsular extension    09/06/2016 Receptors her2   Breast tumor ER 100% positive, PR 100% positive, HER-2 negative, Ki-67 5%    09/06/2016 Mammogram   Diagnostic mammogram and ultrasound showed a 1.0 cm mass at the 3 clock position of left breast, and the enlarged left axillary node with cortical thickening. The 33mm cm oval cyst in the right breast is likely benign.    09/06/2016 Miscellaneous   Mammaprint showed low risk luminal A type, with average 10 year risk of recurrence untreated 10%.    09/16/2016 Genetic Testing   Genetic testing was normal, and did not reveal a deleterious mutation  One variant of uncertain significance (VUS) was found in the BRIP1 gene.  Genes tested include: ATM,  BARD1, BRCA1, BRCA2, BRIP1, CDH1, CHEK2, FANCC, MLH1, MSH2, MSH6, NBN, PALB2, PMS2, PTEN, RAD51C, RAD51D, TP53, and XRCC2.  This panel also includes deletion/duplication analysis (without sequencing) for one gene, EPCAM.      10/05/2016 Surgery   Left breast lumpectomy and SLN biopsy     10/05/2016 Pathology Results   Left breast lumpectomy showed invasive and in situ ductal carcinoma, 1 cm, grade 1, margins are negative, 2 sentinel lymph nodes are positive for metastatic carcinoma with extracapsular extension. LVI (+)    11/08/2016 - 12/24/2016 Radiation Therapy   Adjuvant radiation (Dr. Lourdes Sledge. The Left breast was treated to 50.4 Gy in 28 fractions of 1.8 Gy per fraction. 2. The Left axilla was treated to 50.4 Gy in 28 fractions of 1.8 Gy per fraction.  3. The Left breast was boosted to 10 Gy in 5 fractions of 2 Gy per fraction.      01/10/2017 -  Anti-estrogen oral therapy   Exemestane 25 mg Daily started 01/10/17, pt stopped in mid March 2019 due to insurance issue. She restarted Exemestane in 2019.    04/14/2017 Surgery   LAPAROSCOPIC BILATERAL SALPINGO OOPHORECTOMY by Dr. Ernestina Penna    09/27/2017 Imaging    [place] Abd 09/27/17  IMPRESSION: 1. Small echogenic focus in the anterior right lobe of liver with diminished echogenicity peripherally. Possible atypical hemangioma but a metastatic deposit cannot be excluded. Consider CT or MRI of the abdomen to assess further. 2. No gallstones. 3. Portions of the head and tail of the pancreas are obscured by bowel gas.    10/14/2017 Imaging  MRI Abd W WO Contrast 10/14/17  IMPRESSION: 1. 8 by 4 mm lesion with signal and enhancement characteristics most typical for a small hemangioma in segment IVb of the liver. No other candidate lesion to correspond with the sonographic finding. No MRI findings of suspicion for hepatic metastatic disease. 2. Small left adrenal adenoma. 3. Scattered descending colon diverticula.      CURRENT THERAPY:   Exemestane 25 mg Daily started 01/10/17, pt stopped in mid March 2019 due to insurance issue. She restarted Exemestane in 2019.   INTERVAL HISTORY: Ms. Luscher returns for follow up as scheduled. She was last seen 12/02/20. She continues Exemestane, tolerating well with mild joint pain in the knuckles and hot flashes which are getting worse in the last 2 months.  Paxil is not controlling but still stabilizing her mood.  She is battling with her weight, she intentionally got down to 178 pounds by limiting carbs, but was not sustainable.  She went to [place] to visit family then tested positive for COVID after she got back (tested + 21 days ago) and was bedbound for a week.  She is still fatigued, but improving.  She was able to go to the gym yesterday.  She has occasional breast pain, stable since surgery, denies new lump/mass, nipple discharge or inversion, or skin change.    All other systems were reviewed with the patient and are negative.  MEDICAL HISTORY:  Past Medical History:  Diagnosis Date   Anxiety    Arthritis    Depression    Headache    ocular migraines - very rare   Kidney infection    Malignant neoplasm of upper-outer quadrant of left breast in female, estrogen receptor negative ([place]) 09/10/2016   pt states that cancer is on left breast, not right   Urinary frequency     SURGICAL HISTORY: Past Surgical History:  Procedure Laterality Date   BREAST LUMPECTOMY WITH RADIOACTIVE SEED AND SENTINEL LYMPH NODE BIOPSY Left 10/05/2016   Procedure: LEFT BREAST RADIOACTIVE SEED GUIDED LUMPECTOMY, LEFT AXILLARY RADIOACTIVE SEED GUIDED NODE EXCISION, LEFT AXILLARY SENTINEL LYMPH NODE BIOPSY(LEFT TARGETED AXILLARY DISSECTION);  Surgeon: Rolm Bookbinder, MD;  Location: [facility];  Service: General;  Laterality: Left;   DENTAL SURGERY     DILATION AND CURETTAGE OF UTERUS     LAPAROSCOPIC BILATERAL SALPINGO OOPHERECTOMY Bilateral 04/14/2017   Procedure: LAPAROSCOPIC BILATERAL SALPINGO  OOPHORECTOMY;  Surgeon: Aloha Gell, MD;  Location: [facility];  Service: Gynecology;  Laterality: Bilateral;   right knee surgery at age of 43       I have reviewed the social history and family history with the patient and they are unchanged from previous note.  ALLERGIES:  has No Known Allergies.  MEDICATIONS:  Current Outpatient Medications  Medication Sig Dispense Refill   gabapentin (NEURONTIN) 100 MG capsule Take 1 capsule (100 mg total) by mouth at bedtime. 30 capsule 1   aspirin EC 81 MG tablet Take 81 mg by mouth daily. Swallow whole.     exemestane (AROMASIN) 25 MG tablet Take 1 tablet (25 mg total) by mouth daily after breakfast. 90 tablet 3   PARoxetine (PAXIL) 20 MG tablet Take 1 tablet (20 mg total) by mouth daily. 90 tablet 2   No current facility-administered medications for this visit.    PHYSICAL EXAMINATION:  Vitals:   06/02/21 0902  BP: 126/66  Pulse: 88  Resp: 17  Temp: 98.2 F (36.8 C)  SpO2: 97%   Filed Weights   06/02/21 0902  Weight: 203 lb 12.8 oz (92.4 kg)    GENERAL:alert, no distress and comfortable SKIN: No rash EYES: sclera clear NECK: Without mass LYMPH:  no palpable cervical or supraclavicular lymphadenopathy  LUNGS: normal breathing effort HEART: no lower extremity edema NEURO: alert & oriented x 3 with fluent speech, no focal motor/sensory deficits Breast exam: Breasts are symmetric without nipple discharge or inversion.  S/p left lumpectomy, incisions completely healed.  No palpable mass in either breast or axilla that I could appreciate  LABORATORY DATA:  I have reviewed the data as listed CBC Latest Ref Rng & Units 06/02/2021 12/02/2020 08/20/2020  WBC 4.0 - 10.5 K/uL 9.2 9.5 8.3  Hemoglobin 12.0 - 15.0 g/dL 13.2 14.4 14.1  Hematocrit 36.0 - 46.0 % 40.8 44.6 43.6  Platelets 150 - 400 K/uL 448(H) 486(H) 464(H)     CMP Latest Ref Rng & Units 06/02/2021 12/02/2020 08/20/2020  Glucose 70 - 99 mg/dL 149(H) 110(H) 102(H)  BUN 6 - 20  mg/dL $Remo'15 15 12  'BkuZl$ Creatinine 0.44 - 1.00 mg/dL 0.92 0.92 0.84  Sodium 135 - 145 mmol/L 141 141 141  Potassium 3.5 - 5.1 mmol/L 4.4 4.0 4.3  Chloride 98 - 111 mmol/L 105 107 108  CO2 22 - 32 mmol/L $RemoveB'28 23 24  'QeJQYTvl$ Calcium 8.9 - 10.3 mg/dL 9.5 9.6 9.1  Total Protein 6.5 - 8.1 g/dL 7.3 7.7 6.9  Total Bilirubin 0.3 - 1.2 mg/dL 0.4 0.6 0.3  Alkaline Phos 38 - 126 U/L 154(H) 175(H) 141(H)  AST 15 - 41 U/L 13(L) 14(L) 14(L)  ALT 0 - 44 U/L 21 34 25      RADIOGRAPHIC STUDIES: I have personally reviewed the radiological images as listed and agreed with the findings in the report. No results found.   ASSESSMENT & PLAN: 50 y.o. woman, presented with screening discovered left breast mass and axilla adenopathy.   1. Breast cancer of upper-outer quadrant of left female breast, pT1bN1aM0 stage IIA, G1, ER+/PR+/HER2-, Ki67 5%; mammaprint low risk luminal type A -diagnosed in 08/2016, s/p lumpectomy and adjuvant radiation.  MammaPrint showed low risk luminal type A, adjuvant chemo was not recommended -She started exemestane with ovarian suppression, s/p BSO.  Plan to continue 7 years -Due to high copay she was switched to letrozole in 03/2018, after 6 month supply ran out, she did not call for refill.  When I saw her on 08/27/2019 I refilled exemestane which is affordable for her.  She tolerates well with mild hot flashes and joint pain -mammograms in October, continue surveillance and AI   2.  Thrombocytosis, polycythemia -She has had stable thrombocytosis 400-480 since 2017, no previous work-up.  Denies history of chronic infection, splenomegaly -MPN panel including JAK2 mutation was negative, EPO level was normal -Possibly reactive/secondary to smoking history.  She was previously referred for sleep study to rule out apnea as a secondary cause -Improved, continue monitoring   3. Depression -she presented on 08/27/19 with tearfulness, lacking motivation, weight gain, poor sleep, negative body and intimacy  issues. She admits to having depression. Denied SI/HI -she was previously on Paxil for hot flashes which helped vasomotor symptoms.  She restarted on 08/27/2019 with significant improvement in her depressed mood and other symptoms.   -stable mood, I refilled Paxil, continue current dose   4. Genetics -Negative for known pathogenic mutations within any of 20 genes on the Breast/Ovarian Cancer Panel through [employer].  One variant of uncertain significance (VUS) called "c.3571A>G (p.Ile1191Val)" was found in one copy of the  BRIP1 gene.     5. Hot flash - secondary to BSO and exemestane, worsening -improved initially on paxil but not helping much now. Paxil is still helping mood at current dose -begin gabapentin 100 mg qHS, avoid with alcohol   6. Bone Health -Normal DEXA in 08/2017 and 06/2020 -We discussed menopause and exemestane can weaken her bone.  -continue calcium, vitamin D, and weight bearing exercise    7. Health maintenance -colonoscopy 2021, 6 polyps removed. 3 year recall -due for PAP at annual physical 06/10/21 with Dr. Martinique, I will send a note  8. COVID-19 + 04/2021 -She received both vaccines and 1 booster -She tested positive after visiting family in [place] -main symptoms was profound fatigue, did not require treatment -recovering  Disposition:  Ms. Olegario Shearer is clinically doing well.  Tolerating exemestane with mild joint pain in her hands and moderate hot flashes.  I recommend to consider adding gabapentin 100 mg nightly, potential benefit and side effects were reviewed.  I recommend to avoid with alcohol.  She will fill prescription and start if hot flashes progress.  Breast exam is benign.  Labs show improved thrombocytosis, and stable elevated alk phos.  Last mammogram in October was reportedly negative.  Overall there is no clinical concern for breast cancer recurrence.    She is nearing 5 years from initial diagnosis, the recurrence risk will be decreased.   Continue surveillance and exemestane, next mammogram in October at [facility] [facility]. Next DEXA 06/2022, continues calcium, vit D, and weight bearing exercise.   Return for lab and routine surveillance visit in 6 months, then annually.   Orders Placed This Encounter  Procedures   MM DIAG BREAST TOMO BILATERAL    Standing Status:   Future    Standing Expiration Date:   06/02/2022    Scheduling Instructions:     [facility]    Order Specific Question:   Reason for Exam (SYMPTOM  OR DIAGNOSIS REQUIRED)    Answer:   h/o L breast cancer 2017    Order Specific Question:   Is the patient pregnant?    Answer:   No    Order Specific Question:   Preferred imaging location?    Answer:   External    All questions were answered. The patient knows to call the clinic with any problems, questions or concerns. No barriers to learning were detected.     Alla Feeling, NP 06/02/21

## 2021-06-02 ENCOUNTER — Encounter: Payer: Self-pay | Admitting: Nurse Practitioner

## 2021-06-02 ENCOUNTER — Other Ambulatory Visit: Payer: Self-pay

## 2021-06-02 ENCOUNTER — Inpatient Hospital Stay: Payer: 59 | Attending: Nurse Practitioner | Admitting: Nurse Practitioner

## 2021-06-02 ENCOUNTER — Inpatient Hospital Stay: Payer: 59

## 2021-06-02 VITALS — BP 126/66 | HR 88 | Temp 98.2°F | Resp 17 | Ht 64.0 in | Wt 203.8 lb

## 2021-06-02 DIAGNOSIS — F419 Anxiety disorder, unspecified: Secondary | ICD-10-CM | POA: Insufficient documentation

## 2021-06-02 DIAGNOSIS — C50412 Malignant neoplasm of upper-outer quadrant of left female breast: Secondary | ICD-10-CM | POA: Insufficient documentation

## 2021-06-02 DIAGNOSIS — D751 Secondary polycythemia: Secondary | ICD-10-CM | POA: Diagnosis not present

## 2021-06-02 DIAGNOSIS — D75839 Thrombocytosis, unspecified: Secondary | ICD-10-CM | POA: Insufficient documentation

## 2021-06-02 DIAGNOSIS — Z170 Estrogen receptor positive status [ER+]: Secondary | ICD-10-CM | POA: Diagnosis not present

## 2021-06-02 DIAGNOSIS — Z79811 Long term (current) use of aromatase inhibitors: Secondary | ICD-10-CM | POA: Insufficient documentation

## 2021-06-02 LAB — IRON AND TIBC
Iron: 102 ug/dL (ref 41–142)
Saturation Ratios: 32 % (ref 21–57)
TIBC: 314 ug/dL (ref 236–444)
UIBC: 212 ug/dL (ref 120–384)

## 2021-06-02 LAB — CBC WITH DIFFERENTIAL/PLATELET
Abs Immature Granulocytes: 0.02 10*3/uL (ref 0.00–0.07)
Basophils Absolute: 0.1 10*3/uL (ref 0.0–0.1)
Basophils Relative: 1 %
Eosinophils Absolute: 0.3 10*3/uL (ref 0.0–0.5)
Eosinophils Relative: 3 %
HCT: 40.8 % (ref 36.0–46.0)
Hemoglobin: 13.2 g/dL (ref 12.0–15.0)
Immature Granulocytes: 0 %
Lymphocytes Relative: 21 %
Lymphs Abs: 1.9 10*3/uL (ref 0.7–4.0)
MCH: 28.6 pg (ref 26.0–34.0)
MCHC: 32.4 g/dL (ref 30.0–36.0)
MCV: 88.3 fL (ref 80.0–100.0)
Monocytes Absolute: 0.5 10*3/uL (ref 0.1–1.0)
Monocytes Relative: 5 %
Neutro Abs: 6.4 10*3/uL (ref 1.7–7.7)
Neutrophils Relative %: 70 %
Platelets: 448 10*3/uL — ABNORMAL HIGH (ref 150–400)
RBC: 4.62 MIL/uL (ref 3.87–5.11)
RDW: 13.3 % (ref 11.5–15.5)
WBC: 9.2 10*3/uL (ref 4.0–10.5)
nRBC: 0 % (ref 0.0–0.2)

## 2021-06-02 LAB — COMPREHENSIVE METABOLIC PANEL
ALT: 21 U/L (ref 0–44)
AST: 13 U/L — ABNORMAL LOW (ref 15–41)
Albumin: 3.6 g/dL (ref 3.5–5.0)
Alkaline Phosphatase: 154 U/L — ABNORMAL HIGH (ref 38–126)
Anion gap: 8 (ref 5–15)
BUN: 15 mg/dL (ref 6–20)
CO2: 28 mmol/L (ref 22–32)
Calcium: 9.5 mg/dL (ref 8.9–10.3)
Chloride: 105 mmol/L (ref 98–111)
Creatinine, Ser: 0.92 mg/dL (ref 0.44–1.00)
GFR, Estimated: >60 mL/min (ref >60)
Glucose, Bld: 149 mg/dL — ABNORMAL HIGH (ref 70–99)
Potassium: 4.4 mmol/L (ref 3.5–5.1)
Sodium: 141 mmol/L (ref 135–145)
Total Bilirubin: 0.4 mg/dL (ref 0.3–1.2)
Total Protein: 7.3 g/dL (ref 6.5–8.1)

## 2021-06-02 LAB — FERRITIN: Ferritin: 78 ng/mL (ref 11–307)

## 2021-06-02 MED ORDER — GABAPENTIN 100 MG PO CAPS: 100.0000 mg | ORAL_CAPSULE | Freq: Every day | ORAL | 1 refills | Status: DC

## 2021-06-02 MED ORDER — EXEMESTANE 25 MG PO TABS: 25.0000 mg | ORAL_TABLET | Freq: Every day | ORAL | 3 refills | Status: DC

## 2021-06-02 MED ORDER — PAROXETINE HCL 20 MG PO TABS: 20.0000 mg | ORAL_TABLET | Freq: Every day | ORAL | 2 refills | Status: DC

## 2021-06-03 ENCOUNTER — Other Ambulatory Visit: Payer: Self-pay | Admitting: Nurse Practitioner

## 2021-06-03 DIAGNOSIS — C50412 Malignant neoplasm of upper-outer quadrant of left female breast: Secondary | ICD-10-CM

## 2021-06-03 DIAGNOSIS — Z170 Estrogen receptor positive status [ER+]: Secondary | ICD-10-CM

## 2021-06-10 ENCOUNTER — Ambulatory Visit (INDEPENDENT_AMBULATORY_CARE_PROVIDER_SITE_OTHER): Payer: 59 | Admitting: Family Medicine

## 2021-06-10 ENCOUNTER — Telehealth: Payer: Self-pay | Admitting: Family Medicine

## 2021-06-10 ENCOUNTER — Other Ambulatory Visit (HOSPITAL_COMMUNITY)
Admission: RE | Admit: 2021-06-10 | Discharge: 2021-06-10 | Disposition: A | Discharge status: Home / Self Care | Payer: 59 | Source: Ambulatory Visit | Attending: Family Medicine | Admitting: Family Medicine

## 2021-06-10 ENCOUNTER — Other Ambulatory Visit: Payer: Self-pay

## 2021-06-10 ENCOUNTER — Encounter: Payer: Self-pay | Admitting: Family Medicine

## 2021-06-10 VITALS — BP 120/70 | HR 95 | Temp 97.5°F | Ht 64.0 in | Wt 202.0 lb

## 2021-06-10 DIAGNOSIS — E785 Hyperlipidemia, unspecified: Secondary | ICD-10-CM

## 2021-06-10 DIAGNOSIS — Z13228 Encounter for screening for other metabolic disorders: Secondary | ICD-10-CM | POA: Diagnosis not present

## 2021-06-10 DIAGNOSIS — Z124 Encounter for screening for malignant neoplasm of cervix: Secondary | ICD-10-CM | POA: Diagnosis present

## 2021-06-10 DIAGNOSIS — Z130 Encounter for screening for diseases of the blood and blood-forming organs and certain disorders involving the immune mechanism: Secondary | ICD-10-CM

## 2021-06-10 DIAGNOSIS — Z13220 Encounter for screening for lipoid disorders: Secondary | ICD-10-CM

## 2021-06-10 DIAGNOSIS — Z1159 Encounter for screening for other viral diseases: Secondary | ICD-10-CM | POA: Diagnosis not present

## 2021-06-10 DIAGNOSIS — Z1329 Encounter for screening for other suspected endocrine disorder: Secondary | ICD-10-CM | POA: Diagnosis not present

## 2021-06-10 DIAGNOSIS — Z0000 Encounter for general adult medical examination without abnormal findings: Secondary | ICD-10-CM

## 2021-06-10 DIAGNOSIS — D473 Essential (hemorrhagic) thrombocythemia: Secondary | ICD-10-CM

## 2021-06-10 DIAGNOSIS — Z6834 Body mass index (BMI) 34.0-34.9, adult: Secondary | ICD-10-CM

## 2021-06-10 DIAGNOSIS — E669 Obesity, unspecified: Secondary | ICD-10-CM

## 2021-06-10 LAB — LIPID PANEL
Cholesterol: 194 mg/dL (ref 0–200)
HDL: 52.9 mg/dL (ref >39.00)
LDL Cholesterol: 115 mg/dL — ABNORMAL HIGH (ref 0–99)
NonHDL: 141.52
Total CHOL/HDL Ratio: 4
Triglycerides: 131 mg/dL (ref 0.0–149.0)
VLDL: 26.2 mg/dL (ref 0.0–40.0)

## 2021-06-10 LAB — HEMOGLOBIN A1C: Hgb A1c MFr Bld: 5.9 % (ref 4.6–6.5)

## 2021-06-10 NOTE — Telephone Encounter (Signed)
Called patient to let her know Dr. Martinique will take her husband on as a new patient and to get him scheduled.  No answer, left voicemail message.

## 2021-06-10 NOTE — Assessment & Plan Note (Signed)
Continue non pharmacologic treatment. Further recommendations according to 10 years CVD risk and lipid panel numbers.

## 2021-06-10 NOTE — Telephone Encounter (Signed)
Okay to schedule

## 2021-06-10 NOTE — Telephone Encounter (Signed)
Patient stopped by the desk to ask about scheduling a new patient visit for her husband with Dr. Martinique.  Informed patient that a message would sent back to Dr. Martinique asking if it was okay to take husband on as new patient and someone would give her a call to schedule if it was okay.  Please advise.

## 2021-06-10 NOTE — Progress Notes (Signed)
Chief Complaint  Patient presents with   Annual Exam   HPI:  Chief Complaint  Patient presents with   Annual Exam   [name] is a 50 y.o. female, who is here today for her routine CPE. Last CPE:09/04/19  She lives with her husband. Regular exercise:Started going to the gym last week, elliptical x 20 min 2 times so far.Planning on daily exercise. Healthful diet:She is decreasing carbs intake.Her husband cooks, vegetables, almond milk, snacks in yogurt. Occasionally she gets ice cream.  Immunization History  Administered Date(s) Administered   Influenza,inj,Quad PF,6+ Mos 09/04/2019   PFIZER(Purple Top)SARS-COV-2 Vaccination 02/12/2020, 03/04/2020   Tdap 11/23/2015   Last Pap smear:2017. History of abnormal Pap smear:Negative. Sexually active.  Menarche:12 LMP:50 yo. G:4 L:1 A:2 miscarriages:1  Mammogram: 06/02/21. Colon cancer screening: Colonoscopy 08/29/20. DEXA: 08/2020 followed by oncologist. Hep C screening: Never.  Lab Results  Component Value Date   CHOL 209 (H) 09/04/2019   HDL 55.30 09/04/2019   LDLCALC 137 (H) 09/04/2019   TRIG 85.0 09/04/2019   CHOLHDL 4 09/04/2019   Breast cancer and thrombocythemia: Following with oncologist q 6 months. She is on Aromasin 25 mg daily.  Lab Results  Component Value Date   WBC 9.2 06/02/2021   HGB 13.2 06/02/2021   HCT 40.8 06/02/2021   MCV 88.3 06/02/2021   PLT 448 (H) 06/02/2021   Gabapentin 100 mg added recently by her oncologist to treat hot flashes.  Anxiety: She is on Paroxetine 20 mg daily, which is still helping.  Review of Systems  Constitutional: Negative for appetite change, and fever. + Fatigue (chronic) HENT: Negative for dental problem, hearing loss, mouth sores, sore throat, trouble swallowing and voice change.   Eyes: Negative for redness and visual disturbance.  Respiratory: Negative for cough, shortness of breath and wheezing.   Cardiovascular: Negative for chest pain  and leg swelling.  Gastrointestinal: Negative for abdominal pain, nausea and vomiting.       No changes in bowel habits.  Endocrine: Negative for cold intolerance, heat intolerance, polydipsia, polyphagia and polyuria.  Genitourinary: Negative for decreased urine volume, dysuria, hematuria, vaginal bleeding and vaginal discharge.  Musculoskeletal: Negative for gait problem and myalgias. + Arthralgias,  Skin: Negative for color change and rash.  Allergic/Immunologic: Negative for environmental allergies.  Neurological: Negative for syncope, weakness and headaches.  Hematological: Negative for adenopathy. Does not bruise/bleed easily.  Psychiatric/Behavioral: Negative for confusion. The patient is nervous/anxious.   All other systems reviewed and are negative.   Current Outpatient Medications on File Prior to Visit  Medication Sig Dispense Refill   aspirin EC 81 MG tablet Take 81 mg by mouth daily. Swallow whole.     exemestane (AROMASIN) 25 MG tablet Take 1 tablet (25 mg total) by mouth daily after breakfast. 90 tablet 3   gabapentin (NEURONTIN) 100 MG capsule Take 1 capsule (100 mg total) by mouth at bedtime. 30 capsule 1   PARoxetine (PAXIL) 20 MG tablet Take 1 tablet (20 mg total) by mouth daily. 90 tablet 2   No current facility-administered medications on file prior to visit.   Past Medical History:  Diagnosis Date   Anxiety    Arthritis    Depression    Headache    ocular migraines - very rare   Kidney infection    Malignant neoplasm of upper-outer quadrant of left breast in female, estrogen receptor negative ([place]) 09/10/2016   pt states that cancer is on left breast, not right  Urinary frequency    No Known Allergies  Social History   Socioeconomic History   Marital status: Married    Spouse name: Not on file   Number of children: 1   Years of education: Not on file   Highest education level: Not on file  Occupational History   Not on file  Tobacco Use   Smoking  status: Former    Packs/day: 0.50    Years: 30.00    Pack years: 15.00    Types: Cigarettes    Quit date: 08/22/2016    Years since quitting: 4.8   Smokeless tobacco: Never  Vaping Use   Vaping Use: Never used  Substance and Sexual Activity   Alcohol use: Yes    Alcohol/week: 1.0 standard drink    Types: 1 Glasses of wine per week    Comment: 6 drinks wine or beer a week    Drug use: No   Sexual activity: Yes    Birth control/protection: Injection  Other Topics Concern   Not on file  Social History Narrative   Not on file   Social Determinants of Health   Financial Resource Strain: Not on file  Food Insecurity: Not on file  Transportation Needs: Not on file  Physical Activity: Not on file  Stress: Not on file  Social Connections: Not on file   Today's Vitals   06/10/21 0821  BP: 120/70  Pulse: 95  Temp: (!) 97.5 F (36.4 C)  TempSrc: Temporal  SpO2: 97%  Weight: 202 lb (91.6 kg)  Height: 5\' 4"  (1.626 m)   Body mass index is 34.67 kg/m.  Wt Readings from Last 3 Encounters:  06/10/21 202 lb (91.6 kg)  06/02/21 203 lb 12.8 oz (92.4 kg)  12/02/20 201 lb 11.2 oz (91.5 kg)   Physical Exam  Nursing note and vitals reviewed. Constitutional: She is oriented to person, place, and time. She appears well-developed. No distress.  HENT:  Head: Normocephalic and atraumatic.  Right Ear: Hearing, tympanic membrane, external ear and ear canal normal.  Left Ear: Hearing, tympanic membrane, external ear and ear canal normal.  Hearing normal bilateral. Mouth/Throat: Uvula is midline, oropharynx is clear and moist and mucous membranes are normal.  Eyes: Pupils are equal, round, and reactive to light. Conjunctivae and EOM are normal.  Neck: No tracheal deviation present. No thyromegaly present.  Cardiovascular: Normal rate and regular rhythm.  No murmur heard. Pulses:      Dorsalis pedis pulses are 2+ on the right side, and 2+ on the left side.  Respiratory: Effort normal  and breath sounds normal. No respiratory distress.  GI: Soft. She exhibits no mass. There is no hepatomegaly. There is no tenderness.  Genitourinary: There is no rash, tenderness or lesion on the right labia. There is no rash, tenderness or lesion on the left labia.  Vagina with no erythema, discharge, or bleeding.  Uterus is not enlarged and not tender. Cervix with scattered punctual lesions, not vesicular or raised. It exhibits no motion tenderness, no discharge and no friability. Right adnexum displays no mass, no tenderness and no fullness. Left adnexum displays no mass, no tenderness and no fullness.    Genitourinary Comments: Breast: No masses, skin abnormalities, or nipple discharge appreciated bilateral. Pap smear collected. Musculoskeletal:        General: No edema.     Comments: No major deformity or signs of synovitis appreciated.  Lymphadenopathy:    She has no cervical or axillary adenopathy.  Right: No supraclavicular adenopathy present.       Left: No supraclavicular adenopathy present.  Neurological: She is alert and oriented to person, place, and time. She has normal strength. No cranial nerve deficit. Coordination and gait normal.  Reflex Scores:      Bicep reflexes are 2+ on the right side and 2+ on the left side.      Patellar reflexes are 2+ on the right side and 2+ on the left side. Skin: Skin is warm. No rash noted. No erythema.  Psychiatric: She has a normal mood and affect.Well groomed,good eye contact.  ASSESSMENT AND PLAN:  Gloria Smith was seen today for annual exam.  Diagnoses and all orders for this visit: Orders Placed This Encounter  Procedures   Hepatitis C antibody screen   Hemoglobin A1c   Lipid panel   Lab Results  Component Value Date   HGBA1C 5.9 06/10/2021   Lab Results  Component Value Date   CHOL 194 06/10/2021   HDL 52.90 06/10/2021   LDLCALC 115 (H) 06/10/2021   TRIG 131.0 06/10/2021   CHOLHDL 4 06/10/2021   Routine general medical  examination at a health care facility We discussed the importance of regular physical activity and healthy diet for prevention of chronic illness and/or complications. Preventive guidelines reviewed. Vaccination up to date. Ca++ and vit D supplementation to continue. Next CPE in a year.  The 10-year ASCVD risk score Mikey Bussing DC Brooke Bonito., et al., 2013) is: 1.2%   Values used to calculate the score:     Age: 21 years     Sex: Female     Is Non-Hispanic African American: No     Diabetic: No     Tobacco smoker: No     Systolic Blood Pressure: 250 mmHg     Is BP treated: No     HDL Cholesterol: 52.9 mg/dL     Total Cholesterol: 194 mg/dL  Hearing Screening   500Hz  1000Hz  2000Hz  4000Hz   Right ear Pass Pass Pass Pass  Left ear Pass Pass Pass Pass   Screening for endocrine, metabolic and immunity disorder -     Hemoglobin A1c  Screening for cervical cancer -     PAP [[place]]  Encounter for HCV screening test for low risk patient -     Hepatitis C antibody screen  Essential (hemorrhagic) thrombocythemia (HCC) Platelets gradually improving. Following with oncologist.  Hyperlipidemia Continue non pharmacologic treatment. Further recommendations according to 10 years CVD risk and lipid panel numbers.  Class 1 obesity with body mass index (BMI) of 34.0 to 34.9 in adult She understands the benefits of wt loss as well as adverse effects of obesity. Consistency with healthy diet and physical activity encouraged.  Return in 1 year (on 06/10/2022) for CPE.   Sophiamarie Nease Martinique, MD [facility]. [place] office.

## 2021-06-10 NOTE — Assessment & Plan Note (Signed)
She understands the benefits of wt loss as well as adverse effects of obesity. Consistency with healthy diet and physical activity encouraged. 

## 2021-06-10 NOTE — Patient Instructions (Signed)
Today you have you routine preventive visit.  At least 150 minutes of moderate exercise per week, daily brisk walking for 15-30 min is a good exercise option. Healthy diet low in saturated (animal) fats and sweets and consisting of fresh fruits and vegetables, lean meats such as fish and white chicken and whole grains.  These are some of recommendations for screening depending of age and risk factors: A few things to remember from today's visit:   Routine general medical examination at a health care facility  Screening for endocrine, metabolic and immunity disorder - Plan: Hemoglobin O9G, Basic metabolic panel  Screening for cervical cancer - Plan: PAP [[place]]  Encounter for HCV screening test for low risk patient - Plan: Hepatitis C antibody screen  Hyperlipidemia, unspecified hyperlipidemia type - Plan: Lipid panel  Cervical cancer screening   Do not use My Chart to request refills or for acute issues that need immediate attention.   Please be sure medication list is accurate. If a new problem present, please set up appointment sooner than planned today.   - Vaccines:  Tdap vaccine every 10 years.  Shingles vaccine recommended at age 41, could be given after 51 years of age but not sure about insurance coverage.   Pneumonia vaccines: Pneumovax at 35. Sometimes Pneumovax is giving earlier if history of smoking, lung disease,diabetes,kidney disease among some.  Screening for diabetes at age 64 and every 3 years.  Cervical cancer prevention:  Pap smear starts at 51 years of age and continues periodically until 51 years old in low risk women. Pap smear every 3 years between 14 and 52 years old. Pap smear every 3-5 years between women 73 and older if pap smear negative and HPV screening negative.  -Breast cancer: Mammogram: There is disagreement between experts about when to start screening in low risk asymptomatic female but recent recommendations are to start screening  at 46 and not later than 50 years old , every 1-2 years and after 51 yo q 2 years. Screening is recommended until 51 years old but some women can continue screening depending of healthy issues.  Colon cancer screening: Has been recently changed to 51 yo. Insurance may not cover until you are 51 years old. Screening is recommended until 51 years old.  Cholesterol disorder screening at age 82 and every 3 years.  Also recommended:  Dental visit- Brush and floss your teeth twice daily; visit your dentist twice a year. Eye doctor- Get an eye exam at least every 2 years. Helmet use- Always wear a helmet when riding a bicycle, motorcycle, rollerblading or skateboarding. Safe sex- If you may be exposed to sexually transmitted infections, use a condom. Seat belts- Seat belts can save your live; always wear one. Smoke/Carbon Monoxide detectors- These detectors need to be installed on the appropriate level of your home. Replace batteries at least once a year. Skin cancer- When out in the sun please cover up and use sunscreen 15 SPF or higher. Violence- If anyone is threatening or hurting you, please tell your healthcare provider.  Drink alcohol in moderation- Limit alcohol intake to one drink or less per day. Never drink and drive. Calcium supplementation 1000 to 1200 mg daily, ideally through your diet.  Vitamin D supplementation 800 units daily.

## 2021-06-11 LAB — HEPATITIS C ANTIBODY
Hepatitis C Ab: NONREACTIVE
SIGNAL TO CUT-OFF: 0.02 (ref <1.00)

## 2021-06-15 LAB — CYTOLOGY - PAP
Comment: NEGATIVE
Diagnosis: NEGATIVE
High risk HPV: POSITIVE — AB

## 2021-06-16 ENCOUNTER — Encounter: Payer: Self-pay | Admitting: Family Medicine

## 2021-06-16 ENCOUNTER — Other Ambulatory Visit: Payer: Self-pay | Admitting: Family Medicine

## 2021-06-16 DIAGNOSIS — R87810 Cervical high risk human papillomavirus (HPV) DNA test positive: Secondary | ICD-10-CM

## 2021-06-18 ENCOUNTER — Ambulatory Visit: Payer: 59 | Admitting: Obstetrics and Gynecology

## 2021-06-18 ENCOUNTER — Encounter: Payer: Self-pay | Admitting: Obstetrics and Gynecology

## 2021-06-18 ENCOUNTER — Other Ambulatory Visit: Payer: Self-pay

## 2021-06-18 VITALS — BP 122/74 | HR 84 | Ht 64.0 in | Wt 201.0 lb

## 2021-06-18 DIAGNOSIS — Z113 Encounter for screening for infections with a predominantly sexual mode of transmission: Secondary | ICD-10-CM | POA: Diagnosis not present

## 2021-06-18 DIAGNOSIS — A630 Anogenital (venereal) warts: Secondary | ICD-10-CM

## 2021-06-18 NOTE — Progress Notes (Signed)
50 y.o. [id number]. Married White or Caucasian Not Hispanic or Latino female here for a consultation from Dr Martinique secondary to an abnormal pap smear.  Pap from 06/10/21 returned as negative with +HR HPV. No prior abnormal paps. Last pap in 2017.  She has been with her husband for 6 years. They never had STD testing. She did recently have testing for Hep C which was negative.   H/O stage IIA left breast cancer, diagnosed in 2017. ER/PR +, HER-2 negative. H/O lumpectomy, LND, radiation. On Exemestane Negative genetic testing h/o Laparoscopic BSO in 5/18 with Dr Pamala Hurry    No LMP recorded (lmp unknown). Patient is postmenopausal.          Sexually active: Yes.    The current method of family planning is tubal ligation.    Exercising: Yes.    Home exercise routine includes treadmill. Smoker:  no  Health Maintenance: Pap:  06/10/21 Neg Hr Hpv + History of abnormal Pap:  no the most recent one was her first abnormal MMG:  10/12/18 Density C Bi-rads 2 benign BMD:   8/21 t-score -0.1   Colonoscopy: 08/29/20 follow up 3 years  TDaP:  2017 Gardasil: na   reports that she quit smoking about 4 years ago. Her smoking use included cigarettes. She has a 15.00 pack-year smoking history. She has never used smokeless tobacco. She reports current alcohol use of about 1.0 standard drink of alcohol per week. She reports that she does not use drugs. From [place], parents are still there. 9 year old son is living in [place]. 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   Past Medical History:  Diagnosis Date   Anxiety    Arthritis    Depression    Headache    ocular migraines - very rare   Kidney infection    Malignant neoplasm of upper-outer quadrant of left breast in female, estrogen receptor negative ([place]) 09/10/2016   pt states that cancer is on left breast, not right   Urinary frequency     Past Surgical History:  Procedure Laterality Date   BREAST LUMPECTOMY WITH  RADIOACTIVE SEED AND SENTINEL LYMPH NODE BIOPSY Left 10/05/2016   Procedure: LEFT BREAST RADIOACTIVE SEED GUIDED LUMPECTOMY, LEFT AXILLARY RADIOACTIVE SEED GUIDED NODE EXCISION, LEFT AXILLARY SENTINEL LYMPH NODE BIOPSY(LEFT TARGETED AXILLARY DISSECTION);  Surgeon: Rolm Bookbinder, MD;  Location: [facility];  Service: General;  Laterality: Left;   DENTAL SURGERY     DILATION AND CURETTAGE OF UTERUS     LAPAROSCOPIC BILATERAL SALPINGO OOPHERECTOMY Bilateral 04/14/2017   Procedure: LAPAROSCOPIC BILATERAL SALPINGO OOPHORECTOMY;  Surgeon: Aloha Gell, MD;  Location: [facility];  Service: Gynecology;  Laterality: Bilateral;   right knee surgery at age of 25       Current Outpatient Medications  Medication Sig Dispense Refill   aspirin EC 81 MG tablet Take 81 mg by mouth daily. Swallow whole.     exemestane (AROMASIN) 25 MG tablet Take 1 tablet (25 mg total) by mouth daily after breakfast. 90 tablet 3   gabapentin (NEURONTIN) 100 MG capsule Take 1 capsule (100 mg total) by mouth at bedtime. 30 capsule 1   PARoxetine (PAXIL) 20 MG tablet Take 1 tablet (20 mg total) by mouth daily. 90 tablet 2   No current facility-administered medications for this visit.    Family History  Problem Relation Age of Onset   Cancer Paternal Grandmother    Esophageal cancer Paternal Grandmother    Breast cancer  Cousin        paternal 1st cousin, once-removed; dx. 52s   Other Mother        hx precancerous skin finding removed from eyelid at 77-71   Arthritis Mother    Hypertension Father    Heart attack Maternal Grandmother 65   Early death Maternal Grandmother    Heart disease Maternal Grandmother    Stroke Maternal Grandfather    Heart attack Paternal Grandfather 55   Early death Paternal Grandfather    Other Other 29       hx of precancerous finding on abnormal pap smear   Cancer Other        maternal great aunt (MGM's sister) d. early 38s; spinal cancer, but this may have been a secondary cancer w/ NOS  primary   Cancer Other        maternal great grandmother (MGM's mother) d. 57s w/ NOS cancer   Cancer Other        paternal great uncle (PGM's brother) d. 41s w/ NOS cancer   Colon cancer Neg Hx    Rectal cancer Neg Hx    Stomach cancer Neg Hx     Review of Systems  All other systems reviewed and are negative.  Exam:   BP 122/74   Pulse 84   Ht _0  (1.626 m)   Wt 201 lb (91.2 kg)   LMP  (LMP Unknown) Comment: oopherectomy  SpO2 (!) 72%   BMI 34.50 kg/m   Weight change: _1 @ Height:   Height: _2  (162.6 cm)  Ht Readings from Last 3 Encounters:  06/18/21 _3  (1.626 m)  06/10/21 _4  (1.626 m)  06/02/21 _5  (1.626 m)    General appearance: alert, cooperative and appears stated age Head: Normocephalic, without obvious abnormality, atraumatic   Pelvic: External genitalia:  no lesions              Urethra:  normal appearing urethra with no masses, tenderness or lesions              Bartholins and Skenes: normal                 Vagina: atrophic appearing vagina with normal color and discharge, no lesions              Cervix: no lesions  1. HPV (human papilloma virus) anogenital infection Recent pap was normal but with + HR HPV. No prior h/o abnormal paps.  Discussed HPV, reviewed that it is sexually transmitted. Reviewed that there is no HPV test for men and she could of had this for many years.  Will get a copy of her last pap Needs a repeat pap and hpv testing next year with Dr Martinique. If still + for HPV then she needs to return for a colposcopy  2. Screening examination for STD (sexually transmitted disease) She hasn't had STD testing since she has been with her husband.  - SURESWAB CT/NG/T. vaginalis - HIV Antibody (routine testing w rflx) - [facility]  CC: Dr Martinique Note sent

## 2021-06-19 LAB — RPR: RPR Ser Ql: NONREACTIVE

## 2021-06-19 LAB — HIV ANTIBODY (ROUTINE TESTING W REFLEX): HIV 1&2 Ab, 4th Generation: NONREACTIVE

## 2021-06-19 LAB — SURESWAB CT/NG/T. VAGINALIS
C. trachomatis RNA, TMA: NOT DETECTED
N. gonorrhoeae RNA, TMA: NOT DETECTED
Trichomonas vaginalis RNA: NOT DETECTED

## 2021-07-14 ENCOUNTER — Encounter: Payer: Self-pay | Admitting: Hematology

## 2021-12-01 ENCOUNTER — Telehealth: Payer: Self-pay | Admitting: Nurse Practitioner

## 2021-12-01 ENCOUNTER — Inpatient Hospital Stay: Payer: 59

## 2021-12-01 ENCOUNTER — Inpatient Hospital Stay: Payer: 59 | Admitting: Nurse Practitioner

## 2021-12-01 NOTE — Telephone Encounter (Signed)
Sch per 1/10 inbasket, pt aware °

## 2021-12-08 ENCOUNTER — Encounter: Payer: Self-pay | Admitting: Nurse Practitioner

## 2021-12-08 ENCOUNTER — Other Ambulatory Visit: Payer: Self-pay

## 2021-12-08 ENCOUNTER — Inpatient Hospital Stay (HOSPITAL_BASED_OUTPATIENT_CLINIC_OR_DEPARTMENT_OTHER): Payer: 59 | Admitting: Nurse Practitioner

## 2021-12-08 ENCOUNTER — Inpatient Hospital Stay: Payer: 59 | Attending: Nurse Practitioner

## 2021-12-08 VITALS — BP 106/70 | HR 93 | Temp 98.2°F | Resp 20 | Ht 64.0 in | Wt 189.1 lb

## 2021-12-08 DIAGNOSIS — N951 Menopausal and female climacteric states: Secondary | ICD-10-CM | POA: Diagnosis not present

## 2021-12-08 DIAGNOSIS — Z79811 Long term (current) use of aromatase inhibitors: Secondary | ICD-10-CM | POA: Diagnosis not present

## 2021-12-08 DIAGNOSIS — Z170 Estrogen receptor positive status [ER+]: Secondary | ICD-10-CM | POA: Insufficient documentation

## 2021-12-08 DIAGNOSIS — C50412 Malignant neoplasm of upper-outer quadrant of left female breast: Secondary | ICD-10-CM

## 2021-12-08 DIAGNOSIS — E2839 Other primary ovarian failure: Secondary | ICD-10-CM

## 2021-12-08 DIAGNOSIS — F32A Depression, unspecified: Secondary | ICD-10-CM | POA: Insufficient documentation

## 2021-12-08 DIAGNOSIS — D751 Secondary polycythemia: Secondary | ICD-10-CM | POA: Diagnosis not present

## 2021-12-08 LAB — CBC WITH DIFFERENTIAL/PLATELET
Abs Immature Granulocytes: 0.04 10*3/uL (ref 0.00–0.07)
Basophils Absolute: 0.1 10*3/uL (ref 0.0–0.1)
Basophils Relative: 1 %
Eosinophils Absolute: 0.2 10*3/uL (ref 0.0–0.5)
Eosinophils Relative: 2 %
HCT: 42.3 % (ref 36.0–46.0)
Hemoglobin: 14.1 g/dL (ref 12.0–15.0)
Immature Granulocytes: 0 %
Lymphocytes Relative: 24 %
Lymphs Abs: 2.2 10*3/uL (ref 0.7–4.0)
MCH: 28.7 pg (ref 26.0–34.0)
MCHC: 33.3 g/dL (ref 30.0–36.0)
MCV: 86.2 fL (ref 80.0–100.0)
Monocytes Absolute: 0.7 10*3/uL (ref 0.1–1.0)
Monocytes Relative: 7 %
Neutro Abs: 6 10*3/uL (ref 1.7–7.7)
Neutrophils Relative %: 66 %
Platelets: 473 10*3/uL — ABNORMAL HIGH (ref 150–400)
RBC: 4.91 MIL/uL (ref 3.87–5.11)
RDW: 13 % (ref 11.5–15.5)
WBC: 9.2 10*3/uL (ref 4.0–10.5)
nRBC: 0 % (ref 0.0–0.2)

## 2021-12-08 LAB — COMPREHENSIVE METABOLIC PANEL
ALT: 17 U/L (ref 0–44)
AST: 12 U/L — ABNORMAL LOW (ref 15–41)
Albumin: 4.2 g/dL (ref 3.5–5.0)
Alkaline Phosphatase: 160 U/L — ABNORMAL HIGH (ref 38–126)
Anion gap: 6 (ref 5–15)
BUN: 21 mg/dL — ABNORMAL HIGH (ref 6–20)
CO2: 30 mmol/L (ref 22–32)
Calcium: 9.7 mg/dL (ref 8.9–10.3)
Chloride: 103 mmol/L (ref 98–111)
Creatinine, Ser: 0.91 mg/dL (ref 0.44–1.00)
GFR, Estimated: >60 mL/min (ref >60)
Glucose, Bld: 94 mg/dL (ref 70–99)
Potassium: 4.3 mmol/L (ref 3.5–5.1)
Sodium: 139 mmol/L (ref 135–145)
Total Bilirubin: 0.4 mg/dL (ref 0.3–1.2)
Total Protein: 7.5 g/dL (ref 6.5–8.1)

## 2021-12-08 NOTE — Progress Notes (Signed)
[place]   [phone] [phone]   Clinic Follow up Note   Patient Care Team: [place], Betty G, MD as PCP - General (Family Medicine) Kyung Rudd, MD as Consulting Physician (Radiation Oncology) Truitt Merle, MD as Consulting Physician (Hematology) Rolm Bookbinder, MD as Consulting Physician (General Surgery) Gardenia Phlegm, NP as Nurse Practitioner (Hematology and Oncology) 12/08/2021  CHIEF COMPLAINT: Follow-up left breast cancer  SUMMARY OF ONCOLOGIC HISTORY: Oncology History Overview Note  Breast cancer of upper-outer quadrant of left female breast [facility])   Staging form: Breast, AJCC 7th Edition   - Clinical stage from 09/06/2016: Stage IIA (T1b, N1, M0) - Signed by Truitt Merle, MD on 09/15/2016   - Pathologic stage from 10/05/2016: Stage IIA (T1b, N1a, cM0) - Signed by Truitt Merle, MD on 10/24/2016    Breast cancer of upper-outer quadrant of left female breast ([place])  09/06/2016 Initial Diagnosis   Malignant neoplasm of upper-outer quadrant of right female breast ([place])   09/06/2016 Initial Biopsy   Left breast 3:00 position showed invasive ductal carcinoma and DCIS, left axillary lymph node biopsy showed metastatic carcinoma with extracapsular extension   09/06/2016 Receptors her2   Breast tumor ER 100% positive, PR 100% positive, HER-2 negative, Ki-67 5%   09/06/2016 Mammogram   Diagnostic mammogram and ultrasound showed a 1.0 cm mass at the 3 clock position of left breast, and the enlarged left axillary node with cortical thickening. The 94m cm oval cyst in the right breast is likely benign.   09/06/2016 Miscellaneous   Mammaprint showed low risk luminal A type, with average 10 year risk of recurrence untreated 10%.   09/16/2016 Genetic Testing   Genetic testing was normal, and did not reveal a deleterious mutation  One variant of uncertain significance (VUS) was found in the BRIP1 gene.  Genes tested include: ATM, BARD1, BRCA1,  BRCA2, BRIP1, CDH1, CHEK2, FANCC, MLH1, MSH2, MSH6, NBN, PALB2, PMS2, PTEN, RAD51C, RAD51D, TP53, and XRCC2.  This panel also includes deletion/duplication analysis (without sequencing) for one gene, EPCAM.     10/05/2016 Surgery   Left breast lumpectomy and SLN biopsy    10/05/2016 Pathology Results   Left breast lumpectomy showed invasive and in situ ductal carcinoma, 1 cm, grade 1, margins are negative, 2 sentinel lymph nodes are positive for metastatic carcinoma with extracapsular extension. LVI (+)   11/08/2016 - 12/24/2016 Radiation Therapy   Adjuvant radiation (Dr. MTobe Sos The Left breast was treated to 50.4 Gy in 28 fractions of 1.8 Gy per fraction. 2. The Left axilla was treated to 50.4 Gy in 28 fractions of 1.8 Gy per fraction.  3. The Left breast was boosted to 10 Gy in 5 fractions of 2 Gy per fraction.     01/10/2017 -  Anti-estrogen oral therapy   Exemestane 25 mg Daily started 01/10/17, pt stopped in mid March 2019 due to insurance issue. She restarted Exemestane in 2019.    04/14/2017 Surgery   LAPAROSCOPIC BILATERAL SALPINGO OOPHORECTOMY by Dr. FPamala Hurry  09/27/2017 Imaging    [place]d 09/27/17  IMPRESSION: 1. Small echogenic focus in the anterior right lobe of liver with diminished echogenicity peripherally. Possible atypical hemangioma but a metastatic deposit cannot be excluded. Consider CT or MRI of the abdomen to assess further. 2. No gallstones. 3. Portions of the head and tail of the pancreas are obscured by bowel gas.   10/14/2017 Imaging   MRI Abd W WO Contrast 10/14/17  IMPRESSION: 1. 8 by 4 mm lesion  with signal and enhancement characteristics most typical for a small hemangioma in segment IVb of the liver. No other candidate lesion to correspond with the sonographic finding. No MRI findings of suspicion for hepatic metastatic disease. 2. Small left adrenal adenoma. 3. Scattered descending colon diverticula.     CURRENT THERAPY: Exemestane 25 mg Daily  started 01/10/17, pt stopped in mid March 2019 due to insurance issue. She restarted Exemestane in 2019.  INTERVAL HISTORY: Ms. Freeburg returns for follow-up as scheduled, last seen by me 06/02/2021.  Mammogram 07/14/2021 was negative/normal.  She recently traveled home and felt ill after her trip so she rescheduled the visit.  She has lost 20 pounds intentionally through "[place]" food plan.  Her goal is 130 pounds.  She is drinking better, has more energy and feels well in general.  She continues exemestane.  Hot flashes have come down unless she has too much caffeine.  Not taking gabapentin.  Her hands feel tight at times but denies new bone/joint pain.  She has occasional pain at the left breast if she pushes on it "like a bruise."  Denies new lump/mass, nipple discharge or inversion, or skin change.  All other systems were reviewed with the patient and are negative.  MEDICAL HISTORY:  Past Medical History:  Diagnosis Date   Anxiety    Arthritis    Depression    Headache    ocular migraines - very rare   Kidney infection    Malignant neoplasm of upper-outer quadrant of left breast in female, estrogen receptor negative ([place]) 09/10/2016   pt states that cancer is on left breast, not right   Urinary frequency     SURGICAL HISTORY: Past Surgical History:  Procedure Laterality Date   BREAST LUMPECTOMY WITH RADIOACTIVE SEED AND SENTINEL LYMPH NODE BIOPSY Left 10/05/2016   Procedure: LEFT BREAST RADIOACTIVE SEED GUIDED LUMPECTOMY, LEFT AXILLARY RADIOACTIVE SEED GUIDED NODE EXCISION, LEFT AXILLARY SENTINEL LYMPH NODE BIOPSY(LEFT TARGETED AXILLARY DISSECTION);  Surgeon: Rolm Bookbinder, MD;  Location: [facility];  Service: General;  Laterality: Left;   DENTAL SURGERY     DILATION AND CURETTAGE OF UTERUS     LAPAROSCOPIC BILATERAL SALPINGO OOPHERECTOMY Bilateral 04/14/2017   Procedure: LAPAROSCOPIC BILATERAL SALPINGO OOPHORECTOMY;  Surgeon: Aloha Gell, MD;  Location: [facility];  Service:  Gynecology;  Laterality: Bilateral;   right knee surgery at age of 57       I have reviewed the social history and family history with the patient and they are unchanged from previous note.  ALLERGIES:  has No Known Allergies.  MEDICATIONS:  Current Outpatient Medications  Medication Sig Dispense Refill   aspirin EC 81 MG tablet Take 81 mg by mouth daily. Swallow whole.     exemestane (AROMASIN) 25 MG tablet Take 1 tablet (25 mg total) by mouth daily after breakfast. 90 tablet 3   gabapentin (NEURONTIN) 100 MG capsule Take 1 capsule (100 mg total) by mouth at bedtime. 30 capsule 1   PARoxetine (PAXIL) 20 MG tablet Take 1 tablet (20 mg total) by mouth daily. 90 tablet 2   No current facility-administered medications for this visit.    PHYSICAL EXAMINATION: ECOG PERFORMANCE STATUS: 1 - Symptomatic but completely ambulatory  Vitals:   12/08/21 1057  BP: 106/70  Pulse: 93  Resp: 20  Temp: 98.2 F (36.8 C)  SpO2: 98%   Filed Weights   12/08/21 1057  Weight: 189 lb 1.6 oz (85.8 kg)    GENERAL:alert, no distress and comfortable SKIN: No rash EYES:  sclera clear NECK: Without mass LYMPH:  no palpable cervical or supraclavicular lymphadenopathy  LUNGS: clear with normal breathing effort HEART: regular rate & rhythm, no lower extremity edema ABDOMEN:abdomen soft, non-tender and normal bowel sounds Musculoskeletal: no focal spinal tenderness NEURO: alert & oriented x 3 with fluent speech, no focal motor/sensory deficits Breast exam: Breasts are symmetrical without nipple discharge or inversion.  Small erythematous nodule in the right upper inner breast, chronic.  S/p left lumpectomy, incision completely healed with minimal scar tissue.  No palpable mass in either breast or axilla that I could appreciate  LABORATORY DATA:  I have reviewed the data as listed CBC Latest Ref Rng & Units 12/08/2021 06/02/2021 12/02/2020  WBC 4.0 - 10.5 K/uL 9.2 9.2 9.5  Hemoglobin 12.0 - 15.0 g/dL  14.1 13.2 14.4  Hematocrit 36.0 - 46.0 % 42.3 40.8 44.6  Platelets 150 - 400 K/uL 473(H) 448(H) 486(H)     CMP Latest Ref Rng & Units 12/08/2021 06/02/2021 12/02/2020  Glucose 70 - 99 mg/dL 94 149(H) 110(H)  BUN 6 - 20 mg/dL 21(H) 15 15  Creatinine 0.44 - 1.00 mg/dL 0.91 0.92 0.92  Sodium 135 - 145 mmol/L 139 141 141  Potassium 3.5 - 5.1 mmol/L 4.3 4.4 4.0  Chloride 98 - 111 mmol/L 103 105 107  CO2 22 - 32 mmol/L '30 28 23  ' Calcium 8.9 - 10.3 mg/dL 9.7 9.5 9.6  Total Protein 6.5 - 8.1 g/dL 7.5 7.3 7.7  Total Bilirubin 0.3 - 1.2 mg/dL 0.4 0.4 0.6  Alkaline Phos 38 - 126 U/L 160(H) 154(H) 175(H)  AST 15 - 41 U/L 12(L) 13(L) 14(L)  ALT 0 - 44 U/L 17 21 34      RADIOGRAPHIC STUDIES: I have personally reviewed the radiological images as listed and agreed with the findings in the report. No results found.   ASSESSMENT & PLAN: 51 y.o. woman, presented with screening discovered left breast mass and axilla adenopathy.   1. Breast cancer of upper-outer quadrant of left female breast, pT1bN1aM0 stage IIA, G1, ER+/PR+/HER2-, Ki67 5%; mammaprint low risk luminal type A -diagnosed in 08/2016, s/p lumpectomy and adjuvant radiation.  MammaPrint showed low risk luminal type A, adjuvant chemo was not recommended -She started exemestane with ovarian suppression, s/p BSO.  Plan to continue for a total of 7 years -Due to high copay she was switched to letrozole in 03/2018, after 6 month supply ran out, she did not call for refill.  She switched back to exemestane on 08/27/2019, She tolerates well with mild hot flashes and joint pain -Ms. Xu is clinically doing well.  Tolerating exemestane without significant side effects.  Breast exam is benign, labs stable.  Mammogram 06/2021 is negative.  There is no clinical concern for breast cancer recurrence. -She is nearly 5 years from initial diagnosis, the current recurrence risk has significantly decreased.  Continue surveillance -She does not see surgeon or  get breast exams elsewhere, so we will see her back for next routine surveillance visit with breast exam in 6 months.    2.  Thrombocytosis, polycythemia -She has had stable thrombocytosis 400-480 since 2017, no previous work-up.  Denies history of chronic infection, splenomegaly -MPN panel including JAK2 mutation was negative, EPO level was normal -Possibly reactive/secondary to smoking history.  She was previously referred for sleep study to rule out apnea as a secondary cause -Stable, mild, continue monitoring   3. Depression -she presented on 08/27/19 with tearfulness, lacking motivation, weight gain, poor sleep, negative body and  intimacy issues. She admits to having depression. Denied SI/HI -she was previously on Paxil for hot flashes which helped vasomotor symptoms.  She restarted on 08/27/2019 with significant improvement in her depressed mood and other symptoms.   -stable mood, continue current dose   4. Genetics -Negative for known pathogenic mutations within any of 20 genes on the Breast/Ovarian Cancer Panel through [employer].  One variant of uncertain significance (VUS) called "[id number] (p.[id number])" was found in one copy of the BRIP1 gene.     5. Hot flash - secondary to BSO and exemestane, worsening -improved initially on paxil but effect plateaued. Paxil is still helping mood at current dose -begin gabapentin 100 mg qHS, avoid with alcohol.  -Hot flashes have improved, no longer needs gabapentin   6. Bone Health -Normal DEXA in 08/2017 and 06/2020 -We discussed menopause and exemestane can weaken her bone.  -continue calcium, vitamin D, and weight bearing exercise, repeat 06/2022   7. Health maintenance -colonoscopy 2021, 6 polyps removed. 3 year recall -Paps per OB/GYN -She is working hard at [employer] and intentional weight loss, doing well.    8. COVID-19 + 04/2021 -She received both vaccines and 1 booster -She tested positive after visiting family in  [place] -main symptoms was profound fatigue, did not require treatment -Resolved -She recently traveled to [place] and felt ill afterwards, reportedly tested negative    PLAN: -Labs, mammogram reviewed -Continue surveillance -Continue exemestane to total 7 years  -DEXA and mammogram 06/2022 -Lab, f/up in 6 months    Orders Placed This Encounter  Procedures   DG Bone Density    Standing Status:   Future    Standing Expiration Date:   12/08/2022    Order Specific Question:   Reason for Exam (SYMPTOM  OR DIAGNOSIS REQUIRED)    Answer:   On aromatase inhibitor for history of breast cancer    Order Specific Question:   Is the patient pregnant?    Answer:   No    Order Specific Question:   Preferred imaging location?    Answer:   [facility]   MM 3D SCREEN BREAST BILATERAL    Standing Status:   Future    Standing Expiration Date:   12/08/2022    Order Specific Question:   Reason for Exam (SYMPTOM  OR DIAGNOSIS REQUIRED)    Answer:   History of left breast cancer s/p lumpectomy 2018    Order Specific Question:   Is the patient pregnant?    Answer:   No    Order Specific Question:   Preferred imaging location?    Answer:   [facility]   All questions were answered. The patient knows to call the clinic with any problems, questions or concerns. No barriers to learning were detected.     Alla Feeling, NP 12/08/21

## 2021-12-09 ENCOUNTER — Telehealth: Payer: Self-pay | Admitting: Hematology

## 2021-12-09 NOTE — Telephone Encounter (Signed)
Left message with follow-up appointment per 11/7 los. 

## 2021-12-29 ENCOUNTER — Encounter: Payer: Self-pay | Admitting: Family Medicine

## 2022-06-04 ENCOUNTER — Other Ambulatory Visit: Payer: Self-pay

## 2022-06-04 DIAGNOSIS — C50412 Malignant neoplasm of upper-outer quadrant of left female breast: Secondary | ICD-10-CM

## 2022-06-06 ENCOUNTER — Other Ambulatory Visit: Payer: Self-pay | Admitting: Nurse Practitioner

## 2022-06-06 DIAGNOSIS — C50412 Malignant neoplasm of upper-outer quadrant of left female breast: Secondary | ICD-10-CM

## 2022-06-07 ENCOUNTER — Inpatient Hospital Stay: Payer: 59 | Admitting: Hematology

## 2022-06-07 ENCOUNTER — Telehealth: Payer: Self-pay | Admitting: Hematology

## 2022-06-07 ENCOUNTER — Inpatient Hospital Stay: Payer: 59

## 2022-06-07 NOTE — Telephone Encounter (Signed)
.  Called pt per 7/17 inbasket , Patient was unavailable, a message with appt time and date was left with number on file.

## 2022-06-08 ENCOUNTER — Encounter: Payer: Self-pay | Admitting: Hematology

## 2022-06-29 ENCOUNTER — Other Ambulatory Visit: Payer: Self-pay

## 2022-06-29 ENCOUNTER — Inpatient Hospital Stay (HOSPITAL_BASED_OUTPATIENT_CLINIC_OR_DEPARTMENT_OTHER): Payer: 59 | Admitting: Hematology

## 2022-06-29 ENCOUNTER — Inpatient Hospital Stay: Payer: 59 | Attending: Hematology

## 2022-06-29 ENCOUNTER — Encounter: Payer: Self-pay | Admitting: Hematology

## 2022-06-29 VITALS — BP 113/62 | HR 76 | Temp 98.0°F | Resp 16 | Ht 64.0 in | Wt 204.8 lb

## 2022-06-29 DIAGNOSIS — E669 Obesity, unspecified: Secondary | ICD-10-CM | POA: Insufficient documentation

## 2022-06-29 DIAGNOSIS — D751 Secondary polycythemia: Secondary | ICD-10-CM | POA: Insufficient documentation

## 2022-06-29 DIAGNOSIS — C50412 Malignant neoplasm of upper-outer quadrant of left female breast: Secondary | ICD-10-CM | POA: Insufficient documentation

## 2022-06-29 DIAGNOSIS — D75839 Thrombocytosis, unspecified: Secondary | ICD-10-CM | POA: Insufficient documentation

## 2022-06-29 DIAGNOSIS — E2839 Other primary ovarian failure: Secondary | ICD-10-CM | POA: Diagnosis not present

## 2022-06-29 DIAGNOSIS — Z170 Estrogen receptor positive status [ER+]: Secondary | ICD-10-CM | POA: Insufficient documentation

## 2022-06-29 DIAGNOSIS — Z6835 Body mass index (BMI) 35.0-35.9, adult: Secondary | ICD-10-CM | POA: Diagnosis not present

## 2022-06-29 DIAGNOSIS — F32A Depression, unspecified: Secondary | ICD-10-CM | POA: Diagnosis not present

## 2022-06-29 DIAGNOSIS — Z79811 Long term (current) use of aromatase inhibitors: Secondary | ICD-10-CM | POA: Insufficient documentation

## 2022-06-29 DIAGNOSIS — N951 Menopausal and female climacteric states: Secondary | ICD-10-CM | POA: Insufficient documentation

## 2022-06-29 LAB — CMP (CANCER CENTER ONLY)
ALT: 25 U/L (ref 0–44)
AST: 16 U/L (ref 15–41)
Albumin: 4.4 g/dL (ref 3.5–5.0)
Alkaline Phosphatase: 154 U/L — ABNORMAL HIGH (ref 38–126)
Anion gap: 4 — ABNORMAL LOW (ref 5–15)
BUN: 16 mg/dL (ref 6–20)
CO2: 30 mmol/L (ref 22–32)
Calcium: 9.4 mg/dL (ref 8.9–10.3)
Chloride: 105 mmol/L (ref 98–111)
Creatinine: 0.86 mg/dL (ref 0.44–1.00)
GFR, Estimated: >60 mL/min
Glucose, Bld: 97 mg/dL (ref 70–99)
Potassium: 4.3 mmol/L (ref 3.5–5.1)
Sodium: 139 mmol/L (ref 135–145)
Total Bilirubin: 0.3 mg/dL (ref 0.3–1.2)
Total Protein: 7.7 g/dL (ref 6.5–8.1)

## 2022-06-29 LAB — CBC WITH DIFFERENTIAL (CANCER CENTER ONLY)
Abs Immature Granulocytes: 0.04 K/uL (ref 0.00–0.07)
Basophils Absolute: 0.1 K/uL (ref 0.0–0.1)
Basophils Relative: 1 %
Eosinophils Absolute: 0.2 K/uL (ref 0.0–0.5)
Eosinophils Relative: 2 %
HCT: 42.8 % (ref 36.0–46.0)
Hemoglobin: 14.1 g/dL (ref 12.0–15.0)
Immature Granulocytes: 0 %
Lymphocytes Relative: 27 %
Lymphs Abs: 2.6 K/uL (ref 0.7–4.0)
MCH: 28.8 pg (ref 26.0–34.0)
MCHC: 32.9 g/dL (ref 30.0–36.0)
MCV: 87.3 fL (ref 80.0–100.0)
Monocytes Absolute: 0.7 K/uL (ref 0.1–1.0)
Monocytes Relative: 8 %
Neutro Abs: 6 K/uL (ref 1.7–7.7)
Neutrophils Relative %: 62 %
Platelet Count: 448 K/uL — ABNORMAL HIGH (ref 150–400)
RBC: 4.9 MIL/uL (ref 3.87–5.11)
RDW: 13.2 % (ref 11.5–15.5)
WBC Count: 9.6 K/uL (ref 4.0–10.5)
nRBC: 0 % (ref 0.0–0.2)

## 2022-06-29 NOTE — Progress Notes (Signed)
Gloria Smith   [phone] [phone]   Clinic Follow up Note   Patient Care Team: Martinique, Betty G, MD as PCP - General (Family Medicine) Kyung Rudd, MD as Consulting Physician (Radiation Oncology) Truitt Merle, MD as Consulting Physician (Hematology) Rolm Bookbinder, MD as Consulting Physician (General Surgery) Delice Bison Charlestine Massed, NP as Nurse Practitioner (Hematology and Oncology)  Date of Service:  06/29/2022  CHIEF COMPLAINT: f/u of left breast cancer  CURRENT THERAPY:  Exemestane, started 12/2016  -off ~09/2018 - 08/2019  ASSESSMENT & PLAN:  Gloria Smith is a 51 y.o. post-BSO female with   1. Breast cancer of upper-outer quadrant of left female breast, pT1bN1aM0 stage IIA, G1, ER+/PR+/HER2-, Ki67 5%; mammaprint low risk luminal type A -diagnosed in 08/2016, s/p lumpectomy and adjuvant radiation.  MammaPrint showed low risk -She started exemestane in 12/2016 with ovarian suppression, s/p BSO in 03/2017.  Plan to continue for a total of 7 years. Due to high copay she was switched to letrozole 03/2018 - 09/2018. Due to life stressors, she did not refill or follow up until 08/2019. -most recent mammogram from 07/14/21 was benign. She is scheduled for repeat next month. -she is clinically doing well, now tolerating exemestane without significant side effects. Labs reviewed, overall stable. Physical exam was unremarkable. There is no clinical concern for breast cancer recurrence. -f/u in 6 months   2.  Thrombocytosis, polycythemia -She has had stable thrombocytosis 400-480 since 2017, no previous work-up.  Denies history of chronic infection, splenomegaly -MPN panel including JAK2 mutation was negative, EPO level was normal -Stable, mild, continue monitoring   4. Genetics -Negative for known pathogenic mutations within any of 20 genes on the Breast/Ovarian Cancer Panel through [employer].  One variant of uncertain significance  (VUS) called "c.[id number]>G (p.Ile1191Val)" was found in one copy of the BRIP1 gene.     5. Hot flashes, Depression -hot flashes secondary to BSO and exemestane -improved initially on paxil but effect plateaued. Paxil is still helping mood at current dose -previously used gabapentin, no longer needs   6. Bone Health -Normal DEXA in 08/2017 and 06/2020 -We discussed menopause and exemestane can weaken her bone.  -continue calcium, vitamin D, and weight bearing exercise,  -plan for repeat with mammogram in 07/2022; I ordered today   7. Health maintenance -colonoscopy 2021, 6 polyps removed. 3 year recall -Paps per OB/GYN -She is working hard at [employer] and intentional weight loss, doing well.    8. Obesity with BMI 35 -She has tried different diet programs for weight loss, weight has been fluctuating. -I recommend colonoscopy management clinic, she is interested I will refer her.   PLAN: -Continue exemestane to total 7 years  -DEXA and mammogram 07/2022 -Lab, f/up in 6 months  -Referral to weight management clinic, I gave her the number to call.   No problem-specific Assessment & Plan notes found for this encounter.   SUMMARY OF ONCOLOGIC HISTORY: Oncology History Overview Note  Breast cancer of upper-outer quadrant of left female breast [facility])   Staging form: Breast, AJCC 7th Edition   - Clinical stage from 09/06/2016: Stage IIA (T1b, N1, M0) - Signed by Truitt Merle, MD on 09/15/2016   - Pathologic stage from 10/05/2016: Stage IIA (T1b, N1a, cM0) - Signed by Truitt Merle, MD on 10/24/2016    Breast cancer of upper-outer quadrant of left female breast ([employer])  09/06/2016 Initial Diagnosis   Malignant neoplasm of upper-outer quadrant of right female breast ([place])  09/06/2016 Initial Biopsy   Left breast 3:00 position showed invasive ductal carcinoma and DCIS, left axillary lymph node biopsy showed metastatic carcinoma with extracapsular extension   09/06/2016 Receptors her2   Breast  tumor ER 100% positive, PR 100% positive, HER-2 negative, Ki-67 5%   09/06/2016 Mammogram   Diagnostic mammogram and ultrasound showed a 1.0 cm mass at the 3 clock position of left breast, and the enlarged left axillary node with cortical thickening. The 55m cm oval cyst in the right breast is likely benign.   09/06/2016 Miscellaneous   Mammaprint showed low risk luminal A type, with average 10 year risk of recurrence untreated 10%.   09/16/2016 Genetic Testing   Genetic testing was normal, and did not reveal a deleterious mutation  One variant of uncertain significance (VUS) was found in the BRIP1 gene.  Genes tested include: ATM, BARD1, BRCA1, BRCA2, BRIP1, CDH1, CHEK2, FANCC, MLH1, MSH2, MSH6, NBN, PALB2, PMS2, PTEN, RAD51C, RAD51D, TP53, and XRCC2.  This panel also includes deletion/duplication analysis (without sequencing) for one gene, EPCAM.     10/05/2016 Surgery   Left breast lumpectomy and SLN biopsy    10/05/2016 Pathology Results   Left breast lumpectomy showed invasive and in situ ductal carcinoma, 1 cm, grade 1, margins are negative, 2 sentinel lymph nodes are positive for metastatic carcinoma with extracapsular extension. LVI (+)   11/08/2016 - 12/24/2016 Radiation Therapy   Adjuvant radiation (Dr. MTobe Sos The Left breast was treated to 50.4 Gy in 28 fractions of 1.8 Gy per fraction. 2. The Left axilla was treated to 50.4 Gy in 28 fractions of 1.8 Gy per fraction.  3. The Left breast was boosted to 10 Gy in 5 fractions of 2 Gy per fraction.     01/10/2017 -  Anti-estrogen oral therapy   Exemestane 25 mg Daily started 01/10/17, pt stopped in mid March 2019 due to insurance issue. She restarted Exemestane in 2019.    04/14/2017 Surgery   LAPAROSCOPIC BILATERAL SALPINGO OOPHORECTOMY by Dr. FPamala Hurry  09/27/2017 Imaging    [place]Abd 09/27/17  IMPRESSION: 1. Small echogenic focus in the anterior right lobe of liver with diminished echogenicity peripherally. Possible atypical  hemangioma but a metastatic deposit cannot be excluded. Consider CT or MRI of the abdomen to assess further. 2. No gallstones. 3. Portions of the head and tail of the pancreas are obscured by bowel gas.   10/14/2017 Imaging   MRI Abd W WO Contrast 10/14/17  IMPRESSION: 1. 8 by 4 mm lesion with signal and enhancement characteristics most typical for a small hemangioma in segment IVb of the liver. No other candidate lesion to correspond with the sonographic finding. No MRI findings of suspicion for hepatic metastatic disease. 2. Small left adrenal adenoma. 3. Scattered descending colon diverticula.      INTERVAL HISTORY:  SShequila Negliais here for a follow up of breast cancer. She was last seen by NP Lacie on 12/08/21. She presents to the clinic alone. She reports she is doing well overall. She denies any new concerns, notes continued, stable breast shooting pains.   All other systems were reviewed with the patient and are negative.  MEDICAL HISTORY:  Past Medical History:  Diagnosis Date   Anxiety    Arthritis    Depression    Headache    ocular migraines - very rare   Kidney infection    Malignant neoplasm of upper-outer quadrant of left breast in [place], [place] receptor [place] 09/10/2016   pt  states that cancer is on left breast, not right   Urinary frequency     SURGICAL HISTORY: Past Surgical History:  Procedure Laterality Date   BREAST LUMPECTOMY WITH RADIOACTIVE SEED AND SENTINEL LYMPH NODE BIOPSY Left 10/05/2016   Procedure: LEFT BREAST RADIOACTIVE SEED GUIDED LUMPECTOMY, LEFT AXILLARY RADIOACTIVE SEED GUIDED NODE EXCISION, LEFT AXILLARY SENTINEL LYMPH NODE BIOPSY(LEFT TARGETED AXILLARY DISSECTION);  Surgeon: Rolm Bookbinder, MD;  Location: [place];  Service: General;  Laterality: Left;   DENTAL SURGERY     DILATION AND CURETTAGE OF UTERUS     LAPAROSCOPIC BILATERAL SALPINGO OOPHERECTOMY Bilateral 04/14/2017   Procedure: LAPAROSCOPIC BILATERAL  SALPINGO OOPHORECTOMY;  Surgeon: Aloha Gell, MD;  Location: [facility];  Service: Gynecology;  Laterality: Bilateral;   right knee surgery at age of 42       I have reviewed the social history and family history with the patient and they are unchanged from previous note.  ALLERGIES:  has No Known Allergies.  MEDICATIONS:  Current Outpatient Medications  Medication Sig Dispense Refill   aspirin EC 81 MG tablet Take 81 mg by mouth daily. Swallow whole.     exemestane (AROMASIN) 25 MG tablet TAKE 1 TABLET(25 MG) BY MOUTH DAILY AFTER BREAKFAST 90 tablet 3   gabapentin (NEURONTIN) 100 MG capsule Take 1 capsule (100 mg total) by mouth at bedtime. 30 capsule 1   PARoxetine (PAXIL) 20 MG tablet TAKE 1 TABLET(20 MG) BY MOUTH DAILY 90 tablet 2   No current facility-administered medications for this visit.    PHYSICAL EXAMINATION: ECOG PERFORMANCE STATUS: 0 - Asymptomatic  Vitals:   06/29/22 1315  BP: 113/62  Pulse: 76  Resp: 16  Temp: 98 F (36.7 C)  SpO2: 95%   Wt Readings from Last 3 Encounters:  06/29/22 204 lb 12.8 oz (92.9 kg)  12/08/21 189 lb 1.6 oz (85.8 kg)  06/18/21 201 lb (91.2 kg)     GENERAL:alert, no distress and comfortable SKIN: skin color, texture, turgor are normal, no rashes or significant lesions EYES: normal, Conjunctiva are pink and non-injected, sclera clear  NECK: supple, thyroid normal size, non-tender, without nodularity LYMPH:  no palpable lymphadenopathy in the cervical, axillary LUNGS: clear to auscultation and percussion with normal breathing effort HEART: regular rate & rhythm and no murmurs and no lower extremity edema ABDOMEN:abdomen soft, non-tender and normal bowel sounds Musculoskeletal:no cyanosis of digits and no clubbing  NEURO: alert & oriented x 3 with fluent speech, no focal motor/sensory deficits BREAST: No palpable mass, nodules or adenopathy bilaterally. Breast exam benign.   LABORATORY DATA:  I have reviewed the data as listed     Latest Ref Rng & Units 06/29/2022   12:48 PM 12/08/2021   10:38 AM 06/02/2021    8:53 AM  CBC  WBC 4.0 - 10.5 K/uL 9.6  9.2  9.2   Hemoglobin 12.0 - 15.0 g/dL 14.1  14.1  13.2   Hematocrit 36.0 - 46.0 % 42.8  42.3  40.8   Platelets 150 - 400 K/uL 448  473  448         Latest Ref Rng & Units 06/29/2022   12:48 PM 12/08/2021   10:38 AM 06/02/2021    8:53 AM  CMP  Glucose 70 - 99 mg/dL 97  94  149   BUN 6 - 20 mg/dL _0 Creatinine 0.44 - 1.00 mg/dL 0.86  0.91  0.92   Sodium 135 - 145 mmol/L 139  139  141  Potassium 3.5 - 5.1 mmol/L 4.3  4.3  4.4   Chloride 98 - 111 mmol/L 105  103  105   CO2 22 - 32 mmol/L _0 Calcium 8.9 - 10.3 mg/dL 9.4  9.7  9.5   Total Protein 6.5 - 8.1 g/dL 7.7  7.5  7.3   Total Bilirubin 0.3 - 1.2 mg/dL 0.3  0.4  0.4   Alkaline Phos 38 - 126 U/L 154  160  154   AST 15 - 41 U/L _1 ALT 0 - 44 U/L _2 RADIOGRAPHIC STUDIES: I have personally reviewed the radiological images as listed and agreed with the findings in the report. No results found.    Orders Placed This Encounter  Procedures   DG Bone Density    Standing Status:   Future    Standing Expiration Date:   06/29/2023    Order Specific Question:   Reason for Exam (SYMPTOM  OR DIAGNOSIS REQUIRED)    Answer:   screening    Order Specific Question:   Is the patient pregnant?    Answer:   No    Order Specific Question:   Preferred imaging location?    Answer:   [facility]   All questions were answered. The patient knows to call the clinic with any problems, questions or concerns. No barriers to learning was detected. The total time spent in the appointment was 30 minutes.     Truitt Merle, MD 06/29/2022   I, Wilburn Mylar, am acting as scribe for Truitt Merle, MD.   I have reviewed the above documentation for accuracy and completeness, and I agree with the above.

## 2022-06-30 ENCOUNTER — Other Ambulatory Visit: Payer: Self-pay

## 2022-06-30 ENCOUNTER — Telehealth: Payer: Self-pay | Admitting: Hematology

## 2022-06-30 ENCOUNTER — Encounter: Payer: Self-pay | Admitting: Hematology

## 2022-06-30 NOTE — Telephone Encounter (Signed)
Scheduled follow-up appointment per 8/8 los. Patient is aware. 

## 2022-07-19 NOTE — Progress Notes (Signed)
HPI: [name] is a 51 y.o. female, who is here today for her routine physical and follow up.  Last CPE: 06/10/21.  She has not been consistent with regular exercise, she goes for a walk "sporadically." She does not cook, fixes frozen meals. She does not eat fast foods. Fruits and vegetables not daily.Snacks frequently throught the day.  Chronic medical problems: HLD,HTN, former smoker (quit in 08/2016), and left breast cancer among some.  Immunization History  Administered Date(s) Administered   Influenza,inj,Quad PF,6+ Mos 09/04/2019   Influenza-Unspecified 10/22/2021   PFIZER(Purple Top)SARS-COV-2 Vaccination 02/12/2020, 03/04/2020   Tdap 11/23/2015   Zoster Recombinat (Shingrix) 07/20/2022   Health Maintenance  Topic Date Due   INFLUENZA VACCINE  06/22/2022   COVID-19 Vaccine (3 - Pfizer risk series) 08/05/2022 (Originally 04/01/2020)   Zoster Vaccines- Shingrix (2 of 2) 09/14/2022   MAMMOGRAM  07/15/2023   COLONOSCOPY (Pts 45-56yr Insurance coverage will need to be confirmed)  08/30/2023   PAP SMEAR-Modifier  06/10/2024   TETANUS/TDAP  11/22/2025   Hepatitis C Screening  Completed   HIV Screening  Completed   HPV VACCINES  Aged Out   She sees Dr JTalbert Nanannually, last seen 06/18/21. She recently followed with her oncologist and had some labs done. Platelets have been elevated for a while and stable.  Lab Results  Component Value Date   WBC 9.6 06/29/2022   HGB 14.1 06/29/2022   HCT 42.8 06/29/2022   MCV 87.3 06/29/2022   PLT 448 (H) 06/29/2022   Lab Results  Component Value Date   CREATININE 0.86 06/29/2022   BUN 16 06/29/2022   NA 139 06/29/2022   K 4.3 06/29/2022   CL 105 06/29/2022   CO2 30 06/29/2022   HLD:She is on non pharmacologic treatment. Lab Results  Component Value Date   CHOL 194 06/10/2021   HDL 52.90 06/10/2021   LDLCALC 115 (H) 06/10/2021   TRIG 131.0 06/10/2021   CHOLHDL 4 06/10/2021   Struggling with wt  loss. According to pt, she has been referred to wt loss clinic by her oncologist.  She has some concerns today.  IP joint achy pain, which has been going on for a while but getting worse. Negative for edema or erythema.  "Raspy" productive cough and occasional wheezing. Negative for SOB with exertion but reports SOB when bending down to put her shoes. She thinks it may be because wt gain. + Heartburn. Cough and wheezing exacerbated with exercise. Light green sputum sometimes. Former smoker, 15 packs per year.  Review of Systems  Constitutional:  Negative for activity change, appetite change and fever.  HENT:  Negative for hearing loss, mouth sores, sore throat and trouble swallowing.   Eyes:  Negative for redness and visual disturbance.  Respiratory:  Positive for cough, shortness of breath and wheezing.   Cardiovascular:  Negative for chest pain and leg swelling.  Gastrointestinal:  Negative for abdominal pain, nausea and vomiting.       No changes in bowel habits.  Endocrine: Negative for cold intolerance, heat intolerance, polydipsia, polyphagia and polyuria.  Genitourinary:  Negative for decreased urine volume, dysuria, hematuria, vaginal bleeding and vaginal discharge.  Musculoskeletal:  Positive for arthralgias. Negative for gait problem and myalgias.  Skin:  Negative for color change and rash.  Allergic/Immunologic: Negative for environmental allergies.  Neurological:  Negative for seizures, syncope, weakness and headaches.  Hematological:  Negative for adenopathy. Does not bruise/bleed easily.  Psychiatric/Behavioral:  Negative for behavioral problems and confusion.  All other systems reviewed and are negative.  Current Outpatient Medications on File Prior to Visit  Medication Sig Dispense Refill   aspirin EC 81 MG tablet Take 81 mg by mouth daily. Swallow whole.     exemestane (AROMASIN) 25 MG tablet TAKE 1 TABLET(25 MG) BY MOUTH DAILY AFTER BREAKFAST 90 tablet 3    PARoxetine (PAXIL) 20 MG tablet TAKE 1 TABLET(20 MG) BY MOUTH DAILY 90 tablet 2   No current facility-administered medications on file prior to visit.   Past Medical History:  Diagnosis Date   Anxiety    Arthritis    Depression    Headache    ocular migraines - very rare   Kidney infection    Malignant neoplasm of upper-outer quadrant of left breast in female, estrogen receptor negative ([place]) 09/10/2016   pt states that cancer is on left breast, not right   Urinary frequency    Past Surgical History:  Procedure Laterality Date   BREAST LUMPECTOMY WITH RADIOACTIVE SEED AND SENTINEL LYMPH NODE BIOPSY Left 10/05/2016   Procedure: LEFT BREAST RADIOACTIVE SEED GUIDED LUMPECTOMY, LEFT AXILLARY RADIOACTIVE SEED GUIDED NODE EXCISION, LEFT AXILLARY SENTINEL LYMPH NODE BIOPSY(LEFT TARGETED AXILLARY DISSECTION);  Surgeon: Rolm Bookbinder, MD;  Location: [place] [facility];  Service: General;  Laterality: Left;   DENTAL SURGERY     DILATION AND CURETTAGE OF UTERUS     LAPAROSCOPIC BILATERAL SALPINGO OOPHERECTOMY Bilateral 04/14/2017   Procedure: LAPAROSCOPIC BILATERAL SALPINGO OOPHORECTOMY;  Surgeon: Aloha Gell, MD;  Location: [facility];  Service: Gynecology;  Laterality: Bilateral;   right knee surgery at age of 59      No Known Allergies  Family History  Problem Relation Age of Onset   Cancer Paternal Grandmother    Esophageal cancer Paternal Grandmother    Breast cancer Cousin        paternal 1st cousin, once-removed; dx. 48s   Other Mother        hx precancerous skin finding removed from eyelid at 52-71   Arthritis Mother    Hypertension Father    Heart attack Maternal Grandmother 26   Early death Maternal Grandmother    Heart disease Maternal Grandmother    Stroke Maternal Grandfather    Heart attack Paternal Grandfather 33   Early death Paternal Grandfather    Other Other 72       hx of precancerous finding on abnormal pap smear   Cancer Other        maternal great aunt (MGM's sister)  d. early 19s; spinal cancer, but this may have been a secondary cancer w/ NOS primary   Cancer Other        maternal great grandmother (MGM's mother) d. 53s w/ NOS cancer   Cancer Other        paternal great uncle (PGM's brother) d. 68s w/ NOS cancer   Colon cancer Neg Hx    Rectal cancer Neg Hx    Stomach cancer Neg Hx    Social History   Socioeconomic History   Marital status: Married    Spouse name: Not on file   Number of children: 1   Years of education: Not on file   Highest education level: Not on file  Occupational History   Not on file  Tobacco Use   Smoking status: Former    Packs/day: 0.50    Years: 30.00    Total pack years: 15.00    Types: Cigarettes    Quit date: 08/22/2016    Years since quitting:  5.9   Smokeless tobacco: Never  Vaping Use   Vaping Use: Never used  Substance and Sexual Activity   Alcohol use: Yes    Alcohol/week: 1.0 standard drink of alcohol    Types: 1 Glasses of wine per week    Comment: 6 drinks wine or beer a week    Drug use: No   Sexual activity: Yes    Birth control/protection: Surgical  Other Topics Concern   Not on file  Social History Narrative   Not on file   Social Determinants of Health   Financial Resource Strain: Not on file  Food Insecurity: Not on file  Transportation Needs: Not on file  Physical Activity: Unknown (09/04/2019)   Exercise Vital Sign    Days of Exercise per Week: 3 days    Minutes of Exercise per Session: Not on file  Stress: Not on file  Social Connections: Not on file   Vitals:   07/20/22 1003  BP: 126/80  Pulse: 90  Resp: 12  Temp: 98.2 F (36.8 C)  SpO2: 97%   Body mass index is 35.75 kg/m.  Wt Readings from Last 3 Encounters:  07/20/22 208 lb 4 oz (94.5 kg)  06/29/22 204 lb 12.8 oz (92.9 kg)  12/08/21 189 lb 1.6 oz (85.8 kg)   Physical Exam Vitals and nursing note reviewed.  Constitutional:      General: She is not in acute distress.    Appearance: She is well-developed and  well-groomed.  HENT:     Head: Normocephalic and atraumatic.     Right Ear: Hearing, tympanic membrane, ear canal and external ear normal.     Left Ear: Hearing, tympanic membrane, ear canal and external ear normal.     Mouth/Throat:     Mouth: Mucous membranes are moist.     Pharynx: Oropharynx is clear. Uvula midline.  Eyes:     Extraocular Movements: Extraocular movements intact.     Conjunctiva/sclera: Conjunctivae normal.     Pupils: Pupils are equal, round, and reactive to light.  Neck:     Thyroid: No thyromegaly.     Trachea: No tracheal deviation.  Cardiovascular:     Rate and Rhythm: Normal rate and regular rhythm.     Pulses:          Dorsalis pedis pulses are 2+ on the right side and 2+ on the left side.     Heart sounds: No murmur heard. Pulmonary:     Effort: Pulmonary effort is normal. Prolonged expiration present. No respiratory distress.     Breath sounds: Normal breath sounds.  Abdominal:     Palpations: Abdomen is soft. There is no hepatomegaly or mass.     Tenderness: There is no abdominal tenderness.  Genitourinary:    Comments: Deferred to gyn. Musculoskeletal:     Comments: No major deformity or signs of synovitis appreciated.  Lymphadenopathy:     Cervical: No cervical adenopathy.     Upper Body:     Right upper body: No supraclavicular adenopathy.     Left upper body: No supraclavicular adenopathy.  Skin:    General: Skin is warm.     Findings: No erythema or rash.  Neurological:     General: No focal deficit present.     Mental Status: She is alert and oriented to person, place, and time.     Cranial Nerves: No cranial nerve deficit.     Coordination: Coordination normal.     Gait: Gait normal.  Deep Tendon Reflexes:     Reflex Scores:      Bicep reflexes are 2+ on the right side and 2+ on the left side.      Patellar reflexes are 2+ on the right side and 2+ on the left side. Psychiatric:        Mood and Affect: Mood and affect normal.    ASSESSMENT AND PLAN:  Ms. Gloria Smith was here today annual physical examination.  Orders Placed This Encounter  Procedures   DG Chest 2 View   Varicella-zoster vaccine IM   Lipid panel   Hemoglobin A1c   Lab Results  Component Value Date   HGBA1C 6.0 07/20/2022   Lab Results  Component Value Date   CHOL 204 (H) 07/20/2022   HDL 55.20 07/20/2022   LDLCALC 130 (H) 07/20/2022   TRIG 93.0 07/20/2022   CHOLHDL 4 07/20/2022   Routine general medical examination at a health care facility We discussed the importance of regular physical activity and healthy diet for prevention of chronic illness and/or complications. Preventive guidelines reviewed. Vaccination updated. Ca++ and vit D supplementation recommended. Next CPE in a year. The 10-year ASCVD risk score (Arnett DK, et al., 2019) is: 1.5%   Values used to calculate the score:     Age: 37 years     Sex: Female     Is Non-Hispanic African American: No     Diabetic: No     Tobacco smoker: No     Systolic Blood Pressure: 364 mmHg     Is BP treated: No     HDL Cholesterol: 55.2 mg/dL     Total Cholesterol: 204 mg/dL  Screening for endocrine, metabolic and immunity disorder -     Hemoglobin A1c  Persistent cough We discussed possible etiologies. ? COPD,GERD,allergies among some discussed. CXR ordered today. If not resolved we will need to consider chest CT and/or pulmonology referral for further work up (PFT's).  Reactive airway disease without asthma Combivent 20-100 mcg to used qid prn recommended. Instructed about warning signs. Further recommendations according to CXR result.  -     Ipratropium-Albuterol (COMBIVENT RESPIMAT) 20-100 MCG/ACT AERS respimat; Inhale 1 puff into the lungs every 6 (six) hours as needed for wheezing.  Osteoarthritis of both hands, unspecified osteoarthritis type Educated about Dx,prognosis,and treatment options. Explained that it is chronic and not many effective treatment  options. Tylenol 500 mg 3-4 times per day.  Need for shingles vaccine -     Varicella-zoster vaccine IM  Hyperlipidemia Non pharmacologic treatment recommended for now. Further recommendations will be given according to 10 years CVD risk score and lipid panel numbers.  Essential (hemorrhagic) thrombocythemia ([place]) Following with hematologist/oncologist.  Class 1 obesity with body mass index (BMI) of 34.0 to 34.9 in adult She has gained some wt since her last CPE. Consistency with following a healthful diet and engaging in regular physical activity encouraged, small changes at the time. She is waiting for appt information, has already been referred to healthy wt clinic.  Return in 3 months (on 10/20/2022) for cough.  Emmit Oriley G. Martinique, MD  [facility]. [place] office.

## 2022-07-20 ENCOUNTER — Ambulatory Visit (INDEPENDENT_AMBULATORY_CARE_PROVIDER_SITE_OTHER): Payer: 59

## 2022-07-20 ENCOUNTER — Encounter: Payer: Self-pay | Admitting: Family Medicine

## 2022-07-20 ENCOUNTER — Ambulatory Visit (INDEPENDENT_AMBULATORY_CARE_PROVIDER_SITE_OTHER): Payer: 59 | Admitting: Family Medicine

## 2022-07-20 VITALS — BP 126/80 | HR 90 | Temp 98.2°F | Resp 12 | Ht 64.0 in | Wt 208.2 lb

## 2022-07-20 DIAGNOSIS — Z0000 Encounter for general adult medical examination without abnormal findings: Secondary | ICD-10-CM

## 2022-07-20 DIAGNOSIS — Z130 Encounter for screening for diseases of the blood and blood-forming organs and certain disorders involving the immune mechanism: Secondary | ICD-10-CM | POA: Diagnosis not present

## 2022-07-20 DIAGNOSIS — R053 Chronic cough: Secondary | ICD-10-CM | POA: Diagnosis not present

## 2022-07-20 DIAGNOSIS — Z23 Encounter for immunization: Secondary | ICD-10-CM

## 2022-07-20 DIAGNOSIS — D473 Essential (hemorrhagic) thrombocythemia: Secondary | ICD-10-CM

## 2022-07-20 DIAGNOSIS — J989 Respiratory disorder, unspecified: Secondary | ICD-10-CM | POA: Diagnosis not present

## 2022-07-20 DIAGNOSIS — E6609 Other obesity due to excess calories: Secondary | ICD-10-CM

## 2022-07-20 DIAGNOSIS — M19041 Primary osteoarthritis, right hand: Secondary | ICD-10-CM | POA: Diagnosis not present

## 2022-07-20 DIAGNOSIS — Z6834 Body mass index (BMI) 34.0-34.9, adult: Secondary | ICD-10-CM

## 2022-07-20 DIAGNOSIS — E785 Hyperlipidemia, unspecified: Secondary | ICD-10-CM | POA: Diagnosis not present

## 2022-07-20 DIAGNOSIS — Z13228 Encounter for screening for other metabolic disorders: Secondary | ICD-10-CM

## 2022-07-20 DIAGNOSIS — Z1329 Encounter for screening for other suspected endocrine disorder: Secondary | ICD-10-CM

## 2022-07-20 DIAGNOSIS — M19042 Primary osteoarthritis, left hand: Secondary | ICD-10-CM

## 2022-07-20 LAB — LIPID PANEL
Cholesterol: 204 mg/dL — ABNORMAL HIGH (ref 0–200)
HDL: 55.2 mg/dL (ref >39.00)
LDL Cholesterol: 130 mg/dL — ABNORMAL HIGH (ref 0–99)
NonHDL: 148.39
Total CHOL/HDL Ratio: 4
Triglycerides: 93 mg/dL (ref 0.0–149.0)
VLDL: 18.6 mg/dL (ref 0.0–40.0)

## 2022-07-20 LAB — HEMOGLOBIN A1C: Hgb A1c MFr Bld: 6 % (ref 4.6–6.5)

## 2022-07-20 MED ORDER — COMBIVENT RESPIMAT 20-100 MCG/ACT IN AERS: 1.0000 | INHALATION_SPRAY | Freq: Four times a day (QID) | RESPIRATORY_TRACT | 2 refills | Status: AC | PRN

## 2022-07-20 NOTE — Assessment & Plan Note (Addendum)
She has gained some wt since her last CPE. Consistency with following a healthful diet and engaging in regular physical activity encouraged, small changes at the time. She is waiting for appt information, has already been referred to healthy wt clinic.

## 2022-07-20 NOTE — Assessment & Plan Note (Signed)
Following with hematologist/oncologist.

## 2022-07-20 NOTE — Assessment & Plan Note (Signed)
Non pharmacologic treatment recommended for now. Further recommendations will be given according to 10 years CVD risk score and lipid panel numbers. 

## 2022-07-20 NOTE — Patient Instructions (Addendum)
A few things to remember from today's visit:  Routine general medical examination at a health care facility  Hyperlipidemia, unspecified hyperlipidemia type - Plan: Lipid panel  Screening for endocrine, metabolic and immunity disorder - Plan: DG Chest 2 View, Hemoglobin A1c  Persistent cough  Reactive airway disease without asthma - Plan: Ipratropium-Albuterol (COMBIVENT RESPIMAT) 20-100 MCG/ACT AERS respimat  Osteoarthritis of both hands, unspecified osteoarthritis type  Combivent started today to use 3-4 times per day as needed. If you need refills please call your pharmacy.  Do not use My Chart to request refills or for acute issues that need immediate attention.   Please be sure medication list is accurate. If a new problem present, please set up appointment sooner than planned today.  Health Maintenance, Female Adopting a healthy lifestyle and getting preventive care are important in promoting health and wellness. Ask your health care provider about: The right schedule for you to have regular tests and exams. Things you can do on your own to prevent diseases and keep yourself healthy. What should I know about diet, weight, and exercise? Eat a healthy diet  Eat a diet that includes plenty of vegetables, fruits, low-fat dairy products, and lean protein. Do not eat a lot of foods that are high in solid fats, added sugars, or sodium. Maintain a healthy weight Body mass index (BMI) is used to identify weight problems. It estimates body fat based on height and weight. Your health care provider can help determine your BMI and help you achieve or maintain a healthy weight. Get regular exercise Get regular exercise. This is one of the most important things you can do for your health. Most adults should: Exercise for at least 150 minutes each week. The exercise should increase your heart rate and make you sweat (moderate-intensity exercise). Do strengthening exercises at least twice a  week. This is in addition to the moderate-intensity exercise. Spend less time sitting. Even light physical activity can be beneficial. Watch cholesterol and blood lipids Have your blood tested for lipids and cholesterol at 52 years of age, then have this test every 5 years. Have your cholesterol levels checked more often if: Your lipid or cholesterol levels are high. You are older than 52 years of age. You are at high risk for heart disease. What should I know about cancer screening? Depending on your health history and family history, you may need to have cancer screening at various ages. This may include screening for: Breast cancer. Cervical cancer. Colorectal cancer. Skin cancer. Lung cancer. What should I know about heart disease, diabetes, and high blood pressure? Blood pressure and heart disease High blood pressure causes heart disease and increases the risk of stroke. This is more likely to develop in people who have high blood pressure readings or are overweight. Have your blood pressure checked: Every 3-5 years if you are 78-49 years of age. Every year if you are 57 years old or older. Diabetes Have regular diabetes screenings. This checks your fasting blood sugar level. Have the screening done: Once every three years after age 41 if you are at a normal weight and have a low risk for diabetes. More often and at a younger age if you are overweight or have a high risk for diabetes. What should I know about preventing infection? Hepatitis B If you have a higher risk for hepatitis B, you should be screened for this virus. Talk with your health care provider to find out if you are at risk for hepatitis  B infection. Hepatitis C Testing is recommended for: Everyone born from [birth year] through 1966. Anyone with known risk factors for hepatitis C. Sexually transmitted infections (STIs) Get screened for STIs, including gonorrhea and chlamydia, if: You are sexually active and are younger  than 52 years of age. You are older than 52 years of age and your health care provider tells you that you are at risk for this type of infection. Your sexual activity has changed since you were last screened, and you are at increased risk for chlamydia or gonorrhea. Ask your health care provider if you are at risk. Ask your health care provider about whether you are at high risk for HIV. Your health care provider may recommend a prescription medicine to help prevent HIV infection. If you choose to take medicine to prevent HIV, you should first get tested for HIV. You should then be tested every 3 months for as long as you are taking the medicine. Pregnancy If you are about to stop having your period (premenopausal) and you may become pregnant, seek counseling before you get pregnant. Take 400 to 800 micrograms (mcg) of folic acid every day if you become pregnant. Ask for birth control (contraception) if you want to prevent pregnancy. Osteoporosis and menopause Osteoporosis is a disease in which the bones lose minerals and strength with aging. This can result in bone fractures. If you are 19 years old or older, or if you are at risk for osteoporosis and fractures, ask your health care provider if you should: Be screened for bone loss. Take a calcium or vitamin D supplement to lower your risk of fractures. Be given hormone replacement therapy (HRT) to treat symptoms of menopause. Follow these instructions at home: Alcohol use Do not drink alcohol if: Your health care provider tells you not to drink. You are pregnant, may be pregnant, or are planning to become pregnant. If you drink alcohol: Limit how much you have to: 0-1 drink a day. Know how much alcohol is in your drink. In the [place] one drink equals one 12 oz bottle of beer (355 mL), one 5 oz glass of wine (148 mL), or one 1 oz glass of hard liquor (44 mL). Lifestyle Do not use any products that contain nicotine or tobacco. These products  include cigarettes, chewing tobacco, and vaping devices, such as e-cigarettes. If you need help quitting, ask your health care provider. Do not use street drugs. Do not share needles. Ask your health care provider for help if you need support or information about quitting drugs. General instructions Schedule regular health, dental, and eye exams. Stay current with your vaccines. Tell your health care provider if: You often feel depressed. You have ever been abused or do not feel safe at home. Summary Adopting a healthy lifestyle and getting preventive care are important in promoting health and wellness. Follow your health care provider's instructions about healthy diet, exercising, and getting tested or screened for diseases. Follow your health care provider's instructions on monitoring your cholesterol and blood pressure. This information is not intended to replace advice given to you by your health care provider. Make sure you discuss any questions you have with your health care provider. Document Revised: 03/30/2021 Document Reviewed: 03/30/2021 Elsevier Patient Education  [place].

## 2022-07-29 ENCOUNTER — Ambulatory Visit
Admission: RE | Admit: 2022-07-29 | Discharge: 2022-07-29 | Disposition: A | Discharge status: Home / Self Care | Payer: 59 | Source: Ambulatory Visit | Attending: Nurse Practitioner | Admitting: Nurse Practitioner

## 2022-07-29 ENCOUNTER — Ambulatory Visit
Admission: RE | Admit: 2022-07-29 | Discharge: 2022-07-29 | Disposition: A | Discharge status: Home / Self Care | Payer: 59 | Source: Ambulatory Visit | Attending: Hematology | Admitting: Hematology

## 2022-07-29 DIAGNOSIS — E2839 Other primary ovarian failure: Secondary | ICD-10-CM

## 2022-07-29 DIAGNOSIS — Z170 Estrogen receptor positive status [ER+]: Secondary | ICD-10-CM

## 2022-08-16 ENCOUNTER — Ambulatory Visit: Payer: 59

## 2022-10-10 ENCOUNTER — Telehealth: Payer: 59 | Admitting: Family

## 2022-10-10 ENCOUNTER — Telehealth: Payer: 59

## 2022-10-10 DIAGNOSIS — U071 COVID-19: Secondary | ICD-10-CM

## 2022-10-10 MED ORDER — MOLNUPIRAVIR EUA 200MG CAPSULE: 4.0000 | ORAL_CAPSULE | Freq: Two times a day (BID) | ORAL | 0 refills | Status: AC

## 2022-10-10 MED ORDER — BENZONATATE 100 MG PO CAPS: 100.0000 mg | ORAL_CAPSULE | Freq: Three times a day (TID) | ORAL | 0 refills | Status: DC | PRN

## 2022-10-10 MED ORDER — MOLNUPIRAVIR EUA 200MG CAPSULE: 4.0000 | ORAL_CAPSULE | Freq: Two times a day (BID) | ORAL | 0 refills | Status: DC

## 2022-10-10 NOTE — Progress Notes (Signed)
Virtual Visit Consent   Sherin Murdoch, you are scheduled for a virtual visit with a [facility] provider today. Just as with appointments in the office, your consent must be obtained to participate. Your consent will be active for this visit and any virtual visit you may have with one of our providers in the next 365 days. If you have a MyChart account, a copy of this consent can be sent to you electronically.  As this is a virtual visit, video technology does not allow for your provider to perform a traditional examination. This may limit your provider's ability to fully assess your condition. If your provider identifies any concerns that need to be evaluated in person or the need to arrange testing (such as labs, EKG, etc.), we will make arrangements to do so. Although advances in technology are sophisticated, we cannot ensure that it will always work on either your end or our end. If the connection with a video visit is poor, the visit may have to be switched to a telephone visit. With either a video or telephone visit, we are not always able to ensure that we have a secure connection.  By engaging in this virtual visit, you consent to the provision of healthcare and authorize for your insurance to be billed (if applicable) for the services provided during this visit. Depending on your insurance coverage, you may receive a charge related to this service.  I need to obtain your verbal consent now. Are you willing to proceed with your visit today? Gloria Smith has provided verbal consent on 10/10/2022 for a virtual visit (video or telephone). Evelina Dun, FNP  Date: 10/10/2022 7:21 PM  Virtual Visit via Video Note   I, Evelina Dun, connected with  Gloria Smith  ([id number], 10-02-1971) on 10/10/22 at  7:15 PM EST by a video-enabled telemedicine application and verified that I am speaking with the correct person using two identifiers.  Location: Patient: Virtual Visit  Location Patient: Home Provider: Virtual Visit Location Provider: Home Office   I discussed the limitations of evaluation and management by telemedicine and the availability of in person appointments. The patient expressed understanding and agreed to proceed.    History of Present Illness: Gloria Smith is a 51 y.o. who identifies as a female who was assigned female at birth, and is being seen today for COVID. She reports her symptoms started yesterday.   HPI: URI  This is a new problem. The current episode started today. The problem has been gradually worsening. The maximum temperature recorded prior to her arrival was 103 - 104 F. Associated symptoms include congestion, coughing, ear pain, headaches, joint pain, rhinorrhea, sinus pain, sneezing and a sore throat. She has tried decongestant, acetaminophen and increased fluids for the symptoms. The treatment provided mild relief.    Problems:  Patient Active Problem List   Diagnosis Date Noted   Hyperlipidemia 06/10/2021   Class 1 obesity with body mass index (BMI) of 34.0 to 34.9 in adult 06/10/2021   Essential (hemorrhagic) thrombocythemia ([place]) 06/10/2021   Snoring 06/04/2020   Genetic testing 10/04/2016   Breast cancer of upper-outer quadrant of left female breast ([place]) 09/10/2016    Allergies: No Known Allergies Medications:  Current Outpatient Medications:    benzonatate (TESSALON PERLES) 100 MG capsule, Take 1 capsule (100 mg total) by mouth 3 (three) times daily as needed., Disp: 20 capsule, Rfl: 0   molnupiravir EUA (LAGEVRIO) 200 mg CAPS capsule, Take 4 capsules (800 mg total)  by mouth 2 (two) times daily for 5 days., Disp: 40 capsule, Rfl: 0   aspirin EC 81 MG tablet, Take 81 mg by mouth daily. Swallow whole., Disp: , Rfl:    exemestane (AROMASIN) 25 MG tablet, TAKE 1 TABLET(25 MG) BY MOUTH DAILY AFTER BREAKFAST, Disp: 90 tablet, Rfl: 3   Ipratropium-Albuterol (COMBIVENT RESPIMAT) 20-100 MCG/ACT AERS respimat, Inhale 1  puff into the lungs every 6 (six) hours as needed for wheezing., Disp: 4 g, Rfl: 2   PARoxetine (PAXIL) 20 MG tablet, TAKE 1 TABLET(20 MG) BY MOUTH DAILY, Disp: 90 tablet, Rfl: 2  Observations/Objective: Patient is well-developed, well-nourished in no acute distress.  Resting comfortably  at home.  Head is normocephalic, atraumatic.  No labored breathing.  Speech is clear and coherent with logical content.  Patient is alert and oriented at baseline.  Nasal congestion  Assessment and Plan: 1. COVID-19 - molnupiravir EUA (LAGEVRIO) 200 mg CAPS capsule; Take 4 capsules (800 mg total) by mouth 2 (two) times daily for 5 days.  Dispense: 40 capsule; Refill: 0 - benzonatate (TESSALON PERLES) 100 MG capsule; Take 1 capsule (100 mg total) by mouth 3 (three) times daily as needed.  Dispense: 20 capsule; Refill: 0  COVID positive, rest, force fluids, tylenol as needed, Quarantine for at least 5 days and you are fever free, then must wear a mask out in public from day 4-70, report any worsening symptoms such as increased shortness of breath, swelling, or continued high fevers. Possible adverse effects discussed with antivirals.    Follow Up Instructions: I discussed the assessment and treatment plan with the patient. The patient was provided an opportunity to ask questions and all were answered. The patient agreed with the plan and demonstrated an understanding of the instructions.  A copy of instructions were sent to the patient via MyChart unless otherwise noted below.     The patient was advised to call back or seek an in-person evaluation if the symptoms worsen or if the condition fails to improve as anticipated.  Time:  I spent 6 minutes with the patient via telehealth technology discussing the above problems/concerns.    Evelina Dun, FNP

## 2022-10-10 NOTE — Addendum Note (Signed)
Addended by: Evelina Dun A on: 10/10/2022 07:48 PM   Modules accepted: Orders

## 2022-10-19 NOTE — Progress Notes (Deleted)
     HPI: [name] is a 51 y.o. female, who is here today for chronic disease management.  Last seen on 07/20/2022.  *** Review of Systems See other pertinent positives and negatives in HPI.  Current Outpatient Medications on File Prior to Visit  Medication Sig Dispense Refill   aspirin EC 81 MG tablet Take 81 mg by mouth daily. Swallow whole.     benzonatate (TESSALON PERLES) 100 MG capsule Take 1 capsule (100 mg total) by mouth 3 (three) times daily as needed. 20 capsule 0   exemestane (AROMASIN) 25 MG tablet TAKE 1 TABLET(25 MG) BY MOUTH DAILY AFTER BREAKFAST 90 tablet 3   Ipratropium-Albuterol (COMBIVENT RESPIMAT) 20-100 MCG/ACT AERS respimat Inhale 1 puff into the lungs every 6 (six) hours as needed for wheezing. 4 g 2   PARoxetine (PAXIL) 20 MG tablet TAKE 1 TABLET(20 MG) BY MOUTH DAILY 90 tablet 2   No current facility-administered medications on file prior to visit.    Past Medical History:  Diagnosis Date   Anxiety    Arthritis    Depression    Headache    ocular migraines - very rare   Kidney infection    Malignant neoplasm of upper-outer quadrant of left breast in female, estrogen receptor negative ([place]) 09/10/2016   pt states that cancer is on left breast, not right   Urinary frequency    No Known Allergies  Social History   Socioeconomic History   Marital status: Married    Spouse name: Not on file   Number of children: 1   Years of education: Not on file   Highest education level: Not on file  Occupational History   Not on file  Tobacco Use   Smoking status: Former    Packs/day: 0.50    Years: 30.00    Total pack years: 15.00    Types: Cigarettes    Quit date: 08/22/2016    Years since quitting: 6.1   Smokeless tobacco: Never  Vaping Use   Vaping Use: Never used  Substance and Sexual Activity   Alcohol use: Yes    Alcohol/week: 1.0 standard drink of alcohol    Types: 1 Glasses of wine per week    Comment: 6 drinks wine or beer  a week    Drug use: No   Sexual activity: Yes    Birth control/protection: Surgical  Other Topics Concern   Not on file  Social History Narrative   Not on file   Social Determinants of Health   Financial Resource Strain: Not on file  Food Insecurity: Not on file  Transportation Needs: Not on file  Physical Activity: Unknown (09/04/2019)   Exercise Vital Sign    Days of Exercise per Week: 3 days    Minutes of Exercise per Session: Not on file  Stress: Not on file  Social Connections: Not on file    There were no vitals filed for this visit. There is no height or weight on file to calculate BMI.  Physical Exam  ASSESSMENT AND PLAN:  There are no diagnoses linked to this encounter.  No orders of the defined types were placed in this encounter.   No problem-specific Assessment & Plan notes found for this encounter.   No follow-ups on file.  Betty G. Martinique, MD  [facility] [place] office.

## 2022-10-20 ENCOUNTER — Ambulatory Visit: Payer: 59 | Admitting: Family Medicine

## 2022-11-09 ENCOUNTER — Ambulatory Visit (INDEPENDENT_AMBULATORY_CARE_PROVIDER_SITE_OTHER): Payer: 59 | Admitting: Family Medicine

## 2022-11-09 ENCOUNTER — Encounter (INDEPENDENT_AMBULATORY_CARE_PROVIDER_SITE_OTHER): Payer: Self-pay | Admitting: Family Medicine

## 2022-11-09 VITALS — BP 108/74 | HR 84 | Temp 98.2°F | Ht 64.0 in | Wt 205.0 lb

## 2022-11-09 DIAGNOSIS — R7303 Prediabetes: Secondary | ICD-10-CM | POA: Diagnosis not present

## 2022-11-09 DIAGNOSIS — Z853 Personal history of malignant neoplasm of breast: Secondary | ICD-10-CM | POA: Diagnosis not present

## 2022-11-09 DIAGNOSIS — Z6835 Body mass index (BMI) 35.0-35.9, adult: Secondary | ICD-10-CM | POA: Diagnosis not present

## 2022-11-09 DIAGNOSIS — E669 Obesity, unspecified: Secondary | ICD-10-CM

## 2022-11-09 DIAGNOSIS — Z0289 Encounter for other administrative examinations: Secondary | ICD-10-CM

## 2022-11-16 NOTE — Progress Notes (Signed)
Office: [phone]  /  Fax: [phone]   Initial Visit  Gloria Smith was seen in clinic today to evaluate for obesity. She is interested in losing weight to improve overall health and reduce the risk of weight related complications. She presents today to review program treatment options, initial physical assessment, and evaluation.     She was referred by: Specialist  When asked what else they would like to accomplish? She states: Improve existing medical conditions and Improve appearance  When asked how has your weight affected you? She states: Contributed to orthopedic problems or mobility issues and Having fatigue  Some associated conditions: Prediabetes and Other: History of breast cancer.  Contributing factors: Menopause and Other: Breast cancer in 2017, 130 pounds prior to that.  Negative for chemotherapy, positive for radiation, ovaries removed, surgical menopause, Paxil.  Weight promoting medications identified: Paxil.  Current nutrition plan: Other: Patient found, "English food", increased snacking, fruits, nuts, chips.  Current level of physical activity: Other: joined [place] well, walks some.  Current or previous pharmacotherapy: Other: Deniece Ree, and Weight Watchers .  Response to medication: Never tried medications  Past medical history includes:   Past Medical History:  Diagnosis Date   Anxiety    Arthritis    Depression    Headache    ocular migraines - very rare   Kidney infection    Malignant neoplasm of upper-outer quadrant of left breast in female, estrogen receptor negative ([place]) 09/10/2016   pt states that cancer is on left breast, not right   Urinary frequency    Objective:   BP 108/74   Pulse 84   Temp 98.2 F (36.8 C)   Ht '5\' 4"'$  (1.626 m)   Wt 205 lb (93 kg)   LMP  (LMP Unknown) Comment: oopherectomy  SpO2 96%   BMI 35.19 kg/m  She was weighed on the bioimpedance scale: Body mass index is 35.19 kg/m.  Peak Weight:205 lbs  ,Visceral Fat Rating:11, Body Fat%:42.9  General:  Alert, oriented and cooperative. Patient is in no acute distress.  Respiratory: Normal respiratory effort, no problems with respiration noted  Extremities: Normal range of motion.    Mental Status: Normal mood and affect. Normal behavior. Normal judgment and thought content.   Assessment and Plan:  1. Pre-diabetes A1c on 07/20/2022 was 6.0.  Patient denies family history of diabetes.  Consuming excess carbs, sweets with the lack of exercise.  Anticipate resolution of prediabetes with lifestyle changes and weight loss.  2. History of left breast cancer Patient is on Aromasin from 25 mg daily.  Oophorectomy done for part of her treatment plan.  It caused surgical menopause, use of Paxil have both contributed to weight gain.  Reduce body fat percentage to 30% to reduce risk of future weight related cancers.  3. Obesity,current BMI 35.3 Review bioimpedance results. Discussed realistic goals.  We reviewed weight, biometrics, associated medical conditions and contributing factors with patient. She would benefit from weight loss therapy via a modified calorie, low-carb, high-protein nutritional plan tailored to their REE (resting energy expenditure) which will be determined by indirect calorimetry.  We will also assess for cardiometabolic risk and nutritional derangements via fasting serologies at her next appointment.     Obesity Treatment / Action Plan:  Patient will work on garnering support from family and friends to begin weight loss journey. Will work on eliminating or reducing the presence of highly palatable, calorie dense foods in the home. Will complete provided nutritional and psychosocial assessment questionnaire  before the next appointment. Will be scheduled for indirect calorimetry to determine resting energy expenditure in a fasting state.  This will allow [place] to create a reduced calorie, high-protein meal plan to promote loss of  fat mass while preserving muscle mass. Will think about ideas on how to incorporate physical activity into their daily routine. Was counseled on pharmacotherapy and role as an adjunct in weight management.   Obesity Education Performed Today:  She was weighed on the bioimpedance scale and results were discussed and documented in the synopsis.  We discussed obesity as a disease and the importance of a more detailed evaluation of all the factors contributing to the disease.  We discussed the importance of long term lifestyle changes which include nutrition, exercise and behavioral modifications as well as the importance of customizing this to her specific health and social needs.  We discussed the benefits of reaching a healthier weight to alleviate the symptoms of existing conditions and reduce the risks of the biomechanical, metabolic and psychological effects of obesity.  Sheala Dosh appears to be in the action stage of change and states they are ready to start intensive lifestyle modifications and behavioral modifications.  30 minutes was spent today on this visit including the above counseling, pre-visit chart review, and post-visit documentation.  Reviewed by clinician on day of visit: allergies, medications, problem list, medical history, surgical history, family history, social history, and previous encounter notes.  I, Davy Pique, am acting as Location manager for Loyal Gambler, DO.  I have reviewed the above documentation for accuracy and completeness, and I agree with the above. Dell Ponto, DO

## 2022-12-02 ENCOUNTER — Encounter: Payer: Self-pay | Admitting: Hematology

## 2022-12-09 ENCOUNTER — Ambulatory Visit (INDEPENDENT_AMBULATORY_CARE_PROVIDER_SITE_OTHER): Payer: 59 | Admitting: Family Medicine

## 2022-12-09 ENCOUNTER — Encounter: Payer: Self-pay | Admitting: Hematology

## 2022-12-09 ENCOUNTER — Encounter (INDEPENDENT_AMBULATORY_CARE_PROVIDER_SITE_OTHER): Payer: Self-pay | Admitting: Family Medicine

## 2022-12-09 VITALS — BP 118/74 | HR 84 | Temp 97.6°F | Ht 64.0 in | Wt 208.0 lb

## 2022-12-09 DIAGNOSIS — M25562 Pain in left knee: Secondary | ICD-10-CM

## 2022-12-09 DIAGNOSIS — F32A Depression, unspecified: Secondary | ICD-10-CM | POA: Insufficient documentation

## 2022-12-09 DIAGNOSIS — R0602 Shortness of breath: Secondary | ICD-10-CM

## 2022-12-09 DIAGNOSIS — F3289 Other specified depressive episodes: Secondary | ICD-10-CM

## 2022-12-09 DIAGNOSIS — G8929 Other chronic pain: Secondary | ICD-10-CM

## 2022-12-09 DIAGNOSIS — Z853 Personal history of malignant neoplasm of breast: Secondary | ICD-10-CM

## 2022-12-09 DIAGNOSIS — R7303 Prediabetes: Secondary | ICD-10-CM

## 2022-12-09 DIAGNOSIS — Z0000 Encounter for general adult medical examination without abnormal findings: Secondary | ICD-10-CM | POA: Insufficient documentation

## 2022-12-09 DIAGNOSIS — E669 Obesity, unspecified: Secondary | ICD-10-CM

## 2022-12-09 DIAGNOSIS — Z1331 Encounter for screening for depression: Secondary | ICD-10-CM | POA: Insufficient documentation

## 2022-12-09 DIAGNOSIS — R5383 Other fatigue: Secondary | ICD-10-CM | POA: Diagnosis not present

## 2022-12-09 DIAGNOSIS — Z6835 Body mass index (BMI) 35.0-35.9, adult: Secondary | ICD-10-CM

## 2022-12-09 MED ORDER — BUPROPION HCL ER (SR) 100 MG PO TB12: 100.0000 mg | ORAL_TABLET | Freq: Two times a day (BID) | ORAL | 0 refills | Status: DC

## 2022-12-10 ENCOUNTER — Encounter (INDEPENDENT_AMBULATORY_CARE_PROVIDER_SITE_OTHER): Payer: Self-pay

## 2022-12-10 LAB — HEMOGLOBIN A1C
Est. average glucose Bld gHb Est-mCnc: 126 mg/dL
Hgb A1c MFr Bld: 6 % — ABNORMAL HIGH (ref 4.8–5.6)

## 2022-12-10 LAB — TSH: TSH: 1.26 u[IU]/mL (ref 0.450–4.500)

## 2022-12-10 LAB — INSULIN, RANDOM: INSULIN: 35.6 u[IU]/mL — ABNORMAL HIGH (ref 2.6–24.9)

## 2022-12-10 LAB — T4, FREE: Free T4: 1.11 ng/dL (ref 0.82–1.77)

## 2022-12-10 LAB — VITAMIN B12: Vitamin B-12: 913 pg/mL (ref 232–1245)

## 2022-12-10 LAB — VITAMIN D 25 HYDROXY (VIT D DEFICIENCY, FRACTURES): Vit D, 25-Hydroxy: 25.8 ng/mL — ABNORMAL LOW (ref 30.0–100.0)

## 2022-12-10 LAB — FOLATE: Folate: 12 ng/mL (ref >3.0)

## 2022-12-13 ENCOUNTER — Ambulatory Visit (INDEPENDENT_AMBULATORY_CARE_PROVIDER_SITE_OTHER): Payer: 59 | Admitting: Physician Assistant

## 2022-12-13 ENCOUNTER — Encounter: Payer: Self-pay | Admitting: Physician Assistant

## 2022-12-13 ENCOUNTER — Ambulatory Visit (INDEPENDENT_AMBULATORY_CARE_PROVIDER_SITE_OTHER): Payer: 59

## 2022-12-13 DIAGNOSIS — M25562 Pain in left knee: Secondary | ICD-10-CM

## 2022-12-13 MED ORDER — MELOXICAM 15 MG PO TABS: 15.0000 mg | ORAL_TABLET | Freq: Every day | ORAL | 0 refills | Status: DC

## 2022-12-13 NOTE — Progress Notes (Signed)
Office Visit Note   Patient: Gloria Smith           Date of Birth: [birth year]           MRN: [id number] Visit Date: 12/13/2022              Requested by: Dell Ponto, DO [address],  [place] [id number] PCP: Martinique, Betty G, MD   Assessment & Plan: Visit Diagnoses:  1. Left knee pain, unspecified chronicity     Plan: Left knee pain x 1 year no particular injury has become more bothersome lately.  X-rays consistent with some earlier medial compartment arthritis that seems to where she has most of her pain.  Cannot rule out a meniscal pathology.  She has no effusion no fever or chills.  She has not really tried anything yet.  We discussed a lot of options.  Not having a lot of pain today.  She is going to [place] to visit her son.  I recommend meloxicam.  She knows to take this with food and not to take Aleve or Advil with this.  She can also try some Voltaren gel.  I will also refer her to physical therapy to learn some strengthening exercises for her knee.  Follow-up if she would like to have an injection  Follow-Up Instructions: Return if symptoms worsen or fail to improve.   Orders:  Orders Placed This Encounter  Procedures   XR KNEE 3 VIEW LEFT   Ambulatory referral to Physical Therapy   Meds ordered this encounter  Medications   meloxicam (MOBIC) 15 MG tablet    Sig: Take 1 tablet (15 mg total) by mouth daily.    Dispense:  30 tablet    Refill:  0      Procedures: No procedures performed   Clinical Data: No additional findings.   Subjective: Chief Complaint  Patient presents with   Left Knee - Pain    HPI pleasant active 52 year old woman with a 1 year history of left knee pain.  She is trying to lose weight and has been seeing weight management.  She feels like the left knee is a hindrance to her being able to exercise.  She was referred here for evaluation.  She says it has had some swelling but not too much today has become painful  at times but better today  Review of Systems  All other systems reviewed and are negative.    Objective: Vital Signs: LMP  (LMP Unknown) Comment: oopherectomy  Physical Exam Constitutional:      Appearance: Normal appearance.  Pulmonary:     Effort: Pulmonary effort is normal.  Skin:    General: Skin is warm and dry.  Neurological:     Mental Status: She is alert.     Ortho Exam Examination of her left knee no effusion no redness good varus valgus and anterior stability.  She does have some hypermobility.  She has full extension and flexion.  Tender more over the medial and posterior medial joint line and a little bit over the medial patellar facet.  Small amount of grinding with range of motion compartments are soft and nontender.  She is neurovascularly intact Specialty Comments:  No specialty comments available.  Imaging: XR KNEE 3 VIEW LEFT  Result Date: 12/13/2022 Three-view radiographs of her left knee were obtained today.  She does have some early sclerotic changes especially in the medial compartment.  Slight joint space narrowing.  Well-maintained alignment  no acute osseous injuries noted    PMFS History: Patient Active Problem List   Diagnosis Date Noted   Other fatigue 12/09/2022   SOBOE (shortness of breath on exertion) 12/09/2022   Depression 12/09/2022   Chronic pain of left knee 12/09/2022   Depression screen 12/09/2022   Pre-diabetes 11/09/2022   History of left breast cancer 11/09/2022   Hyperlipidemia 06/10/2021   Class 1 obesity with body mass index (BMI) of 34.0 to 34.9 in adult 06/10/2021   Essential (hemorrhagic) thrombocythemia ([place]) 06/10/2021   Snoring 06/04/2020   Genetic testing 10/04/2016   Breast cancer of upper-outer quadrant of left female breast ([facility]) 09/10/2016   Past Medical History:  Diagnosis Date   Anxiety    Arthritis    Back pain    Bilateral swelling of feet and ankles    Constipation    Depression    Headache     ocular migraines - very rare   Heartburn    Joint pain    Kidney infection    Malignant neoplasm of upper-outer quadrant of left breast in female, estrogen receptor negative ([facility]) 09/10/2016   pt states that cancer is on left breast, not right   Pre-diabetes    SOB (shortness of breath)    Urinary frequency    Vitamin D deficiency     Family History  Problem Relation Age of Onset   Thyroid disease Mother    Other Mother        hx precancerous skin finding removed from eyelid at 36-71   Arthritis Mother    Hypertension Father    Breast cancer Sister    Heart attack Maternal Grandmother 70   Early death Maternal Grandmother    Heart disease Maternal Grandmother    Stroke Maternal Grandfather    Cancer Paternal Grandmother    Esophageal cancer Paternal Grandmother    Heart attack Paternal Grandfather 5   Early death Paternal Grandfather    Breast cancer Cousin        paternal 1st cousin, once-removed; dx. 62s   Other Other 25       hx of precancerous finding on abnormal pap smear   Cancer Other        maternal great aunt (MGM's sister) d. early 26s; spinal cancer, but this may have been a secondary cancer w/ NOS primary   Cancer Other        maternal great grandmother (MGM's mother) d. 26s w/ NOS cancer   Cancer Other        paternal great uncle (PGM's brother) d. 74s w/ NOS cancer   Colon cancer Neg Hx    Rectal cancer Neg Hx    Stomach cancer Neg Hx     Past Surgical History:  Procedure Laterality Date   BREAST LUMPECTOMY     BREAST LUMPECTOMY WITH RADIOACTIVE SEED AND SENTINEL LYMPH NODE BIOPSY Left 10/05/2016   Procedure: LEFT BREAST RADIOACTIVE SEED GUIDED LUMPECTOMY, LEFT AXILLARY RADIOACTIVE SEED GUIDED NODE EXCISION, LEFT AXILLARY SENTINEL LYMPH NODE BIOPSY(LEFT TARGETED AXILLARY DISSECTION);  Surgeon: Rolm Bookbinder, MD;  Location: [facility];  Service: General;  Laterality: Left;   DENTAL SURGERY     DILATION AND CURETTAGE OF UTERUS     LAPAROSCOPIC BILATERAL  SALPINGO OOPHERECTOMY Bilateral 04/14/2017   Procedure: LAPAROSCOPIC BILATERAL SALPINGO OOPHORECTOMY;  Surgeon: Aloha Gell, MD;  Location: [facility];  Service: Gynecology;  Laterality: Bilateral;   right knee surgery at age of 81      Social History  Occupational History   Not on file  Tobacco Use   Smoking status: Former    Packs/day: 0.50    Years: 30.00    Total pack years: 15.00    Types: Cigarettes    Quit date: 08/22/2016    Years since quitting: 6.3   Smokeless tobacco: Never  Vaping Use   Vaping Use: Never used  Substance and Sexual Activity   Alcohol use: Yes    Alcohol/week: 1.0 standard drink of alcohol    Types: 1 Glasses of wine per week    Comment: 6 drinks wine or beer a week    Drug use: No   Sexual activity: Yes    Birth control/protection: Surgical

## 2022-12-23 ENCOUNTER — Encounter: Payer: Self-pay | Admitting: Hematology

## 2022-12-23 ENCOUNTER — Ambulatory Visit (INDEPENDENT_AMBULATORY_CARE_PROVIDER_SITE_OTHER): Payer: 59 | Admitting: Family Medicine

## 2022-12-23 ENCOUNTER — Encounter (INDEPENDENT_AMBULATORY_CARE_PROVIDER_SITE_OTHER): Payer: Self-pay | Admitting: Family Medicine

## 2022-12-23 VITALS — BP 117/75 | HR 87 | Temp 98.8°F | Ht 64.0 in | Wt 208.0 lb

## 2022-12-23 DIAGNOSIS — E559 Vitamin D deficiency, unspecified: Secondary | ICD-10-CM | POA: Diagnosis not present

## 2022-12-23 DIAGNOSIS — F339 Major depressive disorder, recurrent, unspecified: Secondary | ICD-10-CM

## 2022-12-23 DIAGNOSIS — E669 Obesity, unspecified: Secondary | ICD-10-CM

## 2022-12-23 DIAGNOSIS — G4709 Other insomnia: Secondary | ICD-10-CM | POA: Diagnosis not present

## 2022-12-23 DIAGNOSIS — E88819 Insulin resistance, unspecified: Secondary | ICD-10-CM | POA: Insufficient documentation

## 2022-12-23 DIAGNOSIS — E66812 Obesity, class 2: Secondary | ICD-10-CM

## 2022-12-23 DIAGNOSIS — R7303 Prediabetes: Secondary | ICD-10-CM

## 2022-12-23 DIAGNOSIS — Z6835 Body mass index (BMI) 35.0-35.9, adult: Secondary | ICD-10-CM

## 2022-12-23 MED ORDER — METFORMIN HCL ER 500 MG PO TB24
500.0000 mg | ORAL_TABLET | Freq: Every day | ORAL | 0 refills | Status: DC
Start: 2022-12-23 — End: 2023-01-13

## 2022-12-23 MED ORDER — VITAMIN D (ERGOCALCIFEROL) 1.25 MG (50000 UNIT) PO CAPS: 50000.0000 [IU] | ORAL_CAPSULE | ORAL | 0 refills | Status: DC
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
## 2022-12-26 NOTE — Progress Notes (Unsigned)
Patient Care Team: Martinique, Betty G, MD as PCP - General (Family Medicine) Kyung Rudd, MD as Consulting Physician (Radiation Oncology) Truitt Merle, MD as Consulting Physician (Hematology) Rolm Bookbinder, MD as Consulting Physician (General Surgery) Delice Bison, Charlestine Massed, NP as Nurse Practitioner (Hematology and Oncology)   CHIEF COMPLAINT: Follow up left breast cancer   Oncology History Overview Note  Breast cancer of upper-outer quadrant of left female breast [facility])   Staging form: Breast, AJCC 7th Edition   - Clinical stage from 09/06/2016: Stage IIA (T1b, N1, M0) - Signed by Truitt Merle, MD on 09/15/2016   - Pathologic stage from 10/05/2016: Stage IIA (T1b, N1a, cM0) - Signed by Truitt Merle, MD on 10/24/2016    Breast cancer of upper-outer quadrant of left female breast ([place])  09/06/2016 Initial Diagnosis   Malignant neoplasm of upper-outer quadrant of right female breast ([place])   09/06/2016 Initial Biopsy   Left breast 3:00 position showed invasive ductal carcinoma and DCIS, left axillary lymph node biopsy showed metastatic carcinoma with extracapsular extension   09/06/2016 Receptors her2   Breast tumor ER 100% positive, PR 100% positive, HER-2 negative, Ki-67 5%   09/06/2016 Mammogram   Diagnostic mammogram and ultrasound showed a 1.0 cm mass at the 3 clock position of left breast, and the enlarged left axillary node with cortical thickening. The 81m cm oval cyst in the right breast is likely benign.   09/06/2016 Miscellaneous   Mammaprint showed low risk luminal A type, with average 10 year risk of recurrence untreated 10%.   09/16/2016 Genetic Testing   Genetic testing was normal, and did not reveal a deleterious mutation  One variant of uncertain significance (VUS) was found in the BRIP1 gene.  Genes tested include: ATM, BARD1, BRCA1, BRCA2, BRIP1, CDH1, CHEK2, FANCC, MLH1, MSH2, MSH6, NBN, PALB2, PMS2, PTEN, RAD51C, RAD51D, TP53, and XRCC2.  This panel also includes  deletion/duplication analysis (without sequencing) for one gene, EPCAM.     10/05/2016 Surgery   Left breast lumpectomy and SLN biopsy    10/05/2016 Pathology Results   Left breast lumpectomy showed invasive and in situ ductal carcinoma, 1 cm, grade 1, margins are negative, 2 sentinel lymph nodes are positive for metastatic carcinoma with extracapsular extension. LVI (+)   11/08/2016 - 12/24/2016 Radiation Therapy   Adjuvant radiation (Dr. MTobe Sos The Left breast was treated to 50.4 Gy in 28 fractions of 1.8 Gy per fraction. 2. The Left axilla was treated to 50.4 Gy in 28 fractions of 1.8 Gy per fraction.  3. The Left breast was boosted to 10 Gy in 5 fractions of 2 Gy per fraction.     01/10/2017 -  Anti-estrogen oral therapy   Exemestane 25 mg Daily started 01/10/17, pt stopped in mid March 2019 due to insurance issue. She restarted Exemestane in 2019.    04/14/2017 Surgery   LAPAROSCOPIC BILATERAL SALPINGO OOPHORECTOMY by Dr. FPamala Hurry  09/27/2017 Imaging    [place]d 09/27/17  IMPRESSION: 1. Small echogenic focus in the anterior right lobe of liver with diminished echogenicity peripherally. Possible atypical hemangioma but a metastatic deposit cannot be excluded. Consider CT or MRI of the abdomen to assess further. 2. No gallstones. 3. Portions of the head and tail of the pancreas are obscured by bowel gas.   10/14/2017 Imaging   MRI Abd W WO Contrast 10/14/17  IMPRESSION: 1. 8 by 4 mm lesion with signal and enhancement characteristics most typical for a small hemangioma in segment IVb of the liver. No  other candidate lesion to correspond with the sonographic finding. No MRI findings of suspicion for hepatic metastatic disease. 2. Small left adrenal adenoma. 3. Scattered descending colon diverticula.      CURRENT THERAPY:  Exemestane, starting 12/2016, held ~11/20019 - 08/2019  INTERVAL HISTORY Ms. Gloria Smith returns for follow up as scheduled, last seen by Dr. Burr Medico 06/29/22. Mammo  07/29/22 was negative and DEXA was normal. She continues *** exemestane.   ROS   Past Medical History:  Diagnosis Date   Anxiety    Arthritis    Back pain    Bilateral swelling of feet and ankles    Constipation    Depression    Headache    ocular migraines - very rare   Heartburn    Joint pain    Kidney infection    Malignant neoplasm of upper-outer quadrant of left breast in female, estrogen receptor negative ([place]) 09/10/2016   pt states that cancer is on left breast, not right   Pre-diabetes    SOB (shortness of breath)    Urinary frequency    Vitamin D deficiency      Past Surgical History:  Procedure Laterality Date   BREAST LUMPECTOMY     BREAST LUMPECTOMY WITH RADIOACTIVE SEED AND SENTINEL LYMPH NODE BIOPSY Left 10/05/2016   Procedure: LEFT BREAST RADIOACTIVE SEED GUIDED LUMPECTOMY, LEFT AXILLARY RADIOACTIVE SEED GUIDED NODE EXCISION, LEFT AXILLARY SENTINEL LYMPH NODE BIOPSY(LEFT TARGETED AXILLARY DISSECTION);  Surgeon: Rolm Bookbinder, MD;  Location: [facility];  Service: General;  Laterality: Left;   DENTAL SURGERY     DILATION AND CURETTAGE OF UTERUS     LAPAROSCOPIC BILATERAL SALPINGO OOPHERECTOMY Bilateral 04/14/2017   Procedure: LAPAROSCOPIC BILATERAL SALPINGO OOPHORECTOMY;  Surgeon: Aloha Gell, MD;  Location: [facility];  Service: Gynecology;  Laterality: Bilateral;   right knee surgery at age of 55        Outpatient Encounter Medications as of 12/30/2022  Medication Sig   aspirin EC 81 MG tablet Take 81 mg by mouth daily. Swallow whole.   benzonatate (TESSALON PERLES) 100 MG capsule Take 1 capsule (100 mg total) by mouth 3 (three) times daily as needed.   buPROPion ER (WELLBUTRIN SR) 100 MG 12 hr tablet Take 1 tablet (100 mg total) by mouth 2 (two) times daily.   exemestane (AROMASIN) 25 MG tablet TAKE 1 TABLET(25 MG) BY MOUTH DAILY AFTER BREAKFAST   Ipratropium-Albuterol (COMBIVENT RESPIMAT) 20-100 MCG/ACT AERS respimat Inhale 1 puff into the lungs every 6 (six)  hours as needed for wheezing.   meloxicam (MOBIC) 15 MG tablet Take 1 tablet (15 mg total) by mouth daily.   metFORMIN (GLUCOPHAGE-XR) 500 MG 24 hr tablet Take 1 tablet (500 mg total) by mouth daily with breakfast.   PARoxetine (PAXIL) 20 MG tablet TAKE 1 TABLET(20 MG) BY MOUTH DAILY   Vitamin D, Ergocalciferol, (DRISDOL) 1.25 MG (50000 UNIT) CAPS capsule Take 1 capsule (50,000 Units total) by mouth every 7 (seven) days.   No facility-administered encounter medications on file as of 12/30/2022.     There were no vitals filed for this visit. There is no height or weight on file to calculate BMI.   PHYSICAL EXAM GENERAL:alert, no distress and comfortable SKIN: no rash  EYES: sclera clear NECK: without mass LYMPH:  no palpable cervical or supraclavicular lymphadenopathy  LUNGS: clear with normal breathing effort HEART: regular rate & rhythm, no lower extremity edema ABDOMEN: abdomen soft, non-tender and normal bowel sounds NEURO: alert & oriented x 3 with fluent speech, no  focal motor/sensory deficits Breast exam:  PAC without erythema    CBC    Component Value Date/Time   WBC 9.6 06/29/2022 1248   WBC 9.2 12/08/2021 1038   RBC 4.90 06/29/2022 1248   HGB 14.1 06/29/2022 1248   HGB 14.0 11/24/2017 0900   HCT 42.8 06/29/2022 1248   HCT 42.9 11/24/2017 0900   PLT 448 (H) 06/29/2022 1248   PLT 479 (H) 11/24/2017 0900   MCV 87.3 06/29/2022 1248   MCV 86.6 11/24/2017 0900   MCH 28.8 06/29/2022 1248   MCHC 32.9 06/29/2022 1248   RDW 13.2 06/29/2022 1248   RDW 13.7 11/24/2017 0900   LYMPHSABS 2.6 06/29/2022 1248   LYMPHSABS 1.6 11/24/2017 0900   MONOABS 0.7 06/29/2022 1248   MONOABS 0.6 11/24/2017 0900   EOSABS 0.2 06/29/2022 1248   EOSABS 0.2 11/24/2017 0900   BASOSABS 0.1 06/29/2022 1248   BASOSABS 0.1 11/24/2017 0900     CMP     Component Value Date/Time   NA 139 06/29/2022 1248   NA 141 11/24/2017 0900   K 4.3 06/29/2022 1248   K 4.2 11/24/2017 0900   CL 105  06/29/2022 1248   CO2 30 06/29/2022 1248   CO2 27 11/24/2017 0900   GLUCOSE 97 06/29/2022 1248   GLUCOSE 82 11/24/2017 0900   BUN 16 06/29/2022 1248   BUN 14.7 11/24/2017 0900   CREATININE 0.86 06/29/2022 1248   CREATININE 0.8 11/24/2017 0900   CALCIUM 9.4 06/29/2022 1248   CALCIUM 9.4 11/24/2017 0900   PROT 7.7 06/29/2022 1248   PROT 7.0 11/24/2017 0900   ALBUMIN 4.4 06/29/2022 1248   ALBUMIN 3.5 11/24/2017 0900   AST 16 06/29/2022 1248   AST 15 11/24/2017 0900   ALT 25 06/29/2022 1248   ALT 31 11/24/2017 0900   ALKPHOS 154 (H) 06/29/2022 1248   ALKPHOS 163 (H) 11/24/2017 0900   BILITOT 0.3 06/29/2022 1248   BILITOT 0.22 11/24/2017 0900   GFRNONAA >60 06/29/2022 1248   GFRAA >60 08/20/2020 0740     ASSESSMENT & PLAN:  PLAN:  No orders of the defined types were placed in this encounter.     All questions were answered. The patient knows to call the clinic with any problems, questions or concerns. No barriers to learning were detected. I spent *** counseling the patient face to face. The total time spent in the appointment was *** and more than 50% was on counseling, review of test results, and coordination of care.   Cira Rue, NP-C '@DATE'$ @

## 2022-12-28 ENCOUNTER — Telehealth (INDEPENDENT_AMBULATORY_CARE_PROVIDER_SITE_OTHER): Payer: 59 | Admitting: Psychology

## 2022-12-28 DIAGNOSIS — F5089 Other specified eating disorder: Secondary | ICD-10-CM

## 2022-12-28 DIAGNOSIS — F909 Attention-deficit hyperactivity disorder, unspecified type: Secondary | ICD-10-CM | POA: Diagnosis not present

## 2022-12-28 DIAGNOSIS — F331 Major depressive disorder, recurrent, moderate: Secondary | ICD-10-CM | POA: Diagnosis not present

## 2022-12-30 ENCOUNTER — Inpatient Hospital Stay: Payer: 59

## 2022-12-30 ENCOUNTER — Encounter: Payer: Self-pay | Admitting: Nurse Practitioner

## 2022-12-30 ENCOUNTER — Inpatient Hospital Stay: Payer: 59 | Attending: Nurse Practitioner | Admitting: Nurse Practitioner

## 2022-12-30 VITALS — BP 111/66 | HR 83 | Temp 97.7°F | Resp 20 | Ht 64.0 in | Wt 210.7 lb

## 2022-12-30 DIAGNOSIS — D75839 Thrombocytosis, unspecified: Secondary | ICD-10-CM | POA: Insufficient documentation

## 2022-12-30 DIAGNOSIS — C773 Secondary and unspecified malignant neoplasm of axilla and upper limb lymph nodes: Secondary | ICD-10-CM | POA: Insufficient documentation

## 2022-12-30 DIAGNOSIS — Z79899 Other long term (current) drug therapy: Secondary | ICD-10-CM | POA: Diagnosis not present

## 2022-12-30 DIAGNOSIS — F32A Depression, unspecified: Secondary | ICD-10-CM | POA: Insufficient documentation

## 2022-12-30 DIAGNOSIS — Z79811 Long term (current) use of aromatase inhibitors: Secondary | ICD-10-CM | POA: Insufficient documentation

## 2022-12-30 DIAGNOSIS — D751 Secondary polycythemia: Secondary | ICD-10-CM | POA: Insufficient documentation

## 2022-12-30 DIAGNOSIS — R232 Flushing: Secondary | ICD-10-CM | POA: Diagnosis not present

## 2022-12-30 DIAGNOSIS — C50412 Malignant neoplasm of upper-outer quadrant of left female breast: Secondary | ICD-10-CM | POA: Insufficient documentation

## 2022-12-30 DIAGNOSIS — Z7982 Long term (current) use of aspirin: Secondary | ICD-10-CM | POA: Diagnosis not present

## 2022-12-30 DIAGNOSIS — Z170 Estrogen receptor positive status [ER+]: Secondary | ICD-10-CM | POA: Diagnosis not present

## 2022-12-30 LAB — CMP (CANCER CENTER ONLY)
ALT: 20 U/L (ref 0–44)
AST: 17 U/L (ref 15–41)
Albumin: 4.2 g/dL (ref 3.5–5.0)
Alkaline Phosphatase: 151 U/L — ABNORMAL HIGH (ref 38–126)
Anion gap: 6 (ref 5–15)
BUN: 19 mg/dL (ref 6–20)
CO2: 28 mmol/L (ref 22–32)
Calcium: 9.5 mg/dL (ref 8.9–10.3)
Chloride: 105 mmol/L (ref 98–111)
Creatinine: 0.97 mg/dL (ref 0.44–1.00)
GFR, Estimated: >60 mL/min
Glucose, Bld: 107 mg/dL — ABNORMAL HIGH (ref 70–99)
Potassium: 4.7 mmol/L (ref 3.5–5.1)
Sodium: 139 mmol/L (ref 135–145)
Total Bilirubin: 0.3 mg/dL (ref 0.3–1.2)
Total Protein: 7.1 g/dL (ref 6.5–8.1)

## 2022-12-30 LAB — CBC WITH DIFFERENTIAL (CANCER CENTER ONLY)
Abs Immature Granulocytes: 0.02 10*3/uL (ref 0.00–0.07)
Basophils Absolute: 0.1 10*3/uL (ref 0.0–0.1)
Basophils Relative: 1 %
Eosinophils Absolute: 0.2 10*3/uL (ref 0.0–0.5)
Eosinophils Relative: 2 %
HCT: 42.7 % (ref 36.0–46.0)
Hemoglobin: 13.9 g/dL (ref 12.0–15.0)
Immature Granulocytes: 0 %
Lymphocytes Relative: 17 %
Lymphs Abs: 1.8 10*3/uL (ref 0.7–4.0)
MCH: 28.5 pg (ref 26.0–34.0)
MCHC: 32.6 g/dL (ref 30.0–36.0)
MCV: 87.7 fL (ref 80.0–100.0)
Monocytes Absolute: 0.8 10*3/uL (ref 0.1–1.0)
Monocytes Relative: 8 %
Neutro Abs: 7.7 10*3/uL (ref 1.7–7.7)
Neutrophils Relative %: 72 %
Platelet Count: 456 10*3/uL — ABNORMAL HIGH (ref 150–400)
RBC: 4.87 MIL/uL (ref 3.87–5.11)
RDW: 13.3 % (ref 11.5–15.5)
WBC Count: 10.7 10*3/uL — ABNORMAL HIGH (ref 4.0–10.5)
nRBC: 0 % (ref 0.0–0.2)

## 2022-12-30 NOTE — Progress Notes (Signed)
Chief Complaint:   OBESITY Gloria Smith (MR# [id number]) is a 52 y.o. female who presents for evaluation and treatment of obesity and related comorbidities. Current BMI is Body mass index is 35.7 kg/m. Gloria Smith has been struggling with her weight for many years and has been unsuccessful in either losing weight, maintaining weight loss, or reaching her healthy weight goal.  Gloria Smith is currently in the action stage of change and ready to dedicate time achieving and maintaining a healthier weight. Gloria Smith is interested in becoming our patient and working on intensive lifestyle modifications including (but not limited to) diet and exercise for weight loss.  Gloria Smith's habits were reviewed today and are as follows: Her family eats meals together, she thinks her family will eat healthier with her, her desired weight loss is 68 lbs, she started gaining weight in 2017 after breast cancer diagnosis and treatments, her heaviest weight ever was 212 pounds, she has significant food cravings issues, she snacks frequently in the evenings, she is frequently drinking liquids with calories, she frequently makes poor food choices, she frequently eats larger portions than normal, and she struggles with emotional eating.  Depression Screen Gloria Smith Food and Mood (modified PHQ-9) score was 23.  Subjective:   1. Other fatigue Gloria Smith admits to daytime somnolence and admits to waking up still tired. Patient has a history of symptoms of daytime fatigue and morning fatigue. Gloria Smith generally gets  5-12  hours of sleep per night, and states that she has nightime awakenings. Snoring is present. Apneic episodes are not present. Epworth Sleepiness Score is 0. EKG done, heart rate 83 BPM.  2. SOBOE (shortness of breath on exertion) Gloria Smith notes increasing shortness of breath with exercising and seems to be worsening over time with weight gain. She notes getting out of breath sooner with activity than she used to. This has not  gotten worse recently. Gloria Smith denies shortness of breath at rest or orthopnea.  3. History of left breast cancer Patient has a history of breast cancer in 2017, s/p left lumpectomy with radiation.  Patient is on Aromasin 25 mg daily since 2018.  Patient is managed by Gloria Smith.   4. Pre-diabetes Last A1c 07/20/2022 was 6.0.  Patient consumes excess sweets with the lack of regular exercise.  5. Chronic pain of left knee Left knee hurts when standing and walking.  This is been a barrier to her progress with exercise, which is contributing to weight gain.  Patient has previously had a right knee surgery.  Not taking any medications for pain.  She has slight edema and giving way.  6. Other depression with emotional eating Bariatric PHQ-9:23 (HIGH).  Patient is taking Paxil 20 mg daily for MDD.  Notably, Paxil can cause weight gain.  Mood has improved some.   Assessment/Plan:   1. Other fatigue Gloria Smith does feel that her weight is causing her energy to be lower than it should be. Fatigue may be related to obesity, depression or many other causes. Labs will be ordered, and in the meanwhile, Gloria Smith will focus on self care including making healthy food choices, increasing physical activity and focusing on stress reduction. Update labs today.   - EKG 12-Lead - VITAMIN D 25 Hydroxy (Vit-D Deficiency, Fractures) - TSH - T4, free - Insulin, random - Hemoglobin A1c - Folate - Vitamin B12  2. SOBOE (shortness of breath on exertion) Gloria Smith does feel that she gets out of breath more easily that she used to when she exercises. Gloria Smith shortness  of breath appears to be obesity related and exercise induced. She has agreed to work on weight loss and gradually increase exercise to treat her exercise induced shortness of breath. Will continue to monitor closely.  3. History of left breast cancer Begin plan for body fat reduction to reduce risk of future weight related cancers.  4. Pre-diabetes Begin prescribed  diet.  Consider adding metformin.  Check labs today.  - Insulin, random - Hemoglobin A1c  5. Chronic pain of left knee Referral to orthopedics made for evaluation and treatment.  Referral- Ambulatory referral to Orthopedic Surgery  6. Other depression with emotional eating Referral to Dr. Mallie Smith for CBT made.  Begin- buPROPion ER (WELLBUTRIN SR) 100 MG 12 hr tablet; Take 1 tablet (100 mg total) by mouth 2 (two) times daily.  Dispense: 60 tablet; Refill: 0  7. Depression screen Gloria Smith had a positive depression screening. Depression is commonly associated with obesity and often results in emotional eating behaviors. We will monitor this closely and work on CBT to help improve the non-hunger eating patterns. Referral to Psychology may be required if no improvement is seen as she continues in our clinic.  8. Obesity,current BMI 35.8 Patient was given 100 calorie snack list today.  Check labs today.   - VITAMIN D 25 Hydroxy (Vit-D Deficiency, Fractures) - TSH - T4, free - Folate - Vitamin B12  Gloria Smith is currently in the action stage of change and her goal is to continue with weight loss efforts. I recommend Gloria Smith begin the structured treatment plan as follows:  She has agreed to the Category 2 Plan.  Exercise goals:  increase walking to 20-30 minutes 3 times per week as knee tolerates.     Behavioral modification strategies: increasing lean protein intake, increasing vegetables, increasing water intake, decreasing eating out, no skipping meals, meal planning and cooking strategies, keeping healthy foods in the home, better snacking choices, avoiding temptations, planning for success, and decreasing junk food.  She was informed of the importance of frequent follow-up visits to maximize her success with intensive lifestyle modifications for her multiple health conditions. She was informed we would discuss her lab results at her next visit unless there is a critical issue that needs to be  addressed sooner. Gloria Smith agreed to keep her next visit at the agreed upon time to discuss these results.  Objective:   Blood pressure 118/74, pulse 84, temperature 97.6 F (36.4 C), height 5' 4"$  (1.626 m), weight 208 lb (94.3 kg), SpO2 95 %. Body mass index is 35.7 kg/m.  EKG: Normal sinus rhythm, rate 83 bpm.  Indirect Calorimeter completed today shows a VO2 of 242 and a REE of 1670.  Her calculated basal metabolic rate is [id number] thus her basal metabolic rate is worse than expected.  General: Cooperative, alert, well developed, in no acute distress. HEENT: Conjunctivae and lids unremarkable. Cardiovascular: Regular rhythm.  Lungs: Normal work of breathing. Neurologic: No focal deficits.   Lab Results  Component Value Date   CREATININE 0.86 06/29/2022   BUN 16 06/29/2022   NA 139 06/29/2022   K 4.3 06/29/2022   CL 105 06/29/2022   CO2 30 06/29/2022   Lab Results  Component Value Date   ALT 25 06/29/2022   AST 16 06/29/2022   GGT 31 10/24/2017   ALKPHOS 154 (H) 06/29/2022   BILITOT 0.3 06/29/2022   Lab Results  Component Value Date   HGBA1C 6.0 (H) 12/09/2022   HGBA1C 6.0 07/20/2022   HGBA1C 5.9 06/10/2021  HGBA1C 5.9 09/04/2019   Lab Results  Component Value Date   INSULIN 35.6 (H) 12/09/2022   Lab Results  Component Value Date   TSH 1.260 12/09/2022   Lab Results  Component Value Date   CHOL 204 (H) 07/20/2022   HDL 55.20 07/20/2022   LDLCALC 130 (H) 07/20/2022   TRIG 93.0 07/20/2022   CHOLHDL 4 07/20/2022   Lab Results  Component Value Date   WBC 9.6 06/29/2022   HGB 14.1 06/29/2022   HCT 42.8 06/29/2022   MCV 87.3 06/29/2022   PLT 448 (H) 06/29/2022   Lab Results  Component Value Date   IRON 102 06/02/2021   TIBC 314 06/02/2021   FERRITIN 78 06/02/2021   Attestation Statements:   Reviewed by clinician on day of visit: allergies, medications, problem list, medical history, surgical history, family history, social history, and previous  encounter notes.  I have personally spent 40 minutes total time today in preparation, patient care, nutritional counseling and documentation for this visit, including the following: review of clinical lab tests; review of medical tests/procedures/services.    I, Davy Pique, am acting as Location manager for Loyal Gambler, DO.  I have reviewed the above documentation for accuracy and completeness, and I agree with the above. Dell Ponto, DO

## 2023-01-03 ENCOUNTER — Telehealth (INDEPENDENT_AMBULATORY_CARE_PROVIDER_SITE_OTHER): Payer: 59 | Admitting: Psychology

## 2023-01-03 DIAGNOSIS — F331 Major depressive disorder, recurrent, moderate: Secondary | ICD-10-CM | POA: Diagnosis not present

## 2023-01-03 DIAGNOSIS — F5089 Other specified eating disorder: Secondary | ICD-10-CM | POA: Diagnosis not present

## 2023-01-03 DIAGNOSIS — F909 Attention-deficit hyperactivity disorder, unspecified type: Secondary | ICD-10-CM | POA: Diagnosis not present

## 2023-01-03 NOTE — Progress Notes (Signed)
  Office: [phone]  /  Fax: [phone]    Date: January 03, 2023    Appointment Start Time: 8:00am Duration: 31 minutes Provider: Glennie Isle, Psy.D. Type of Session: Individual Therapy  Location of Patient: Home (private location) Location of Provider: Provider's Home (private office) Type of Contact: Telepsychological Visit via MyChart Video Visit  Session Content: Gloria Smith is a 52 y.o. female presenting for a follow-up appointment to address the previously established treatment goal of increasing coping skills.Today's appointment was a telepsychological visit. Teja provided verbal consent for today's telepsychological appointment and she is aware she is responsible for securing confidentiality on her end of the session. Prior to proceeding with today's appointment, Gloria Smith's physical location at the time of this appointment was obtained as well a phone number she could be reached at in the event of technical difficulties. Gloria Smith and this provider participated in today's telepsychological service.   This provider conducted a brief check-in. [name] shared, "It's been going good." She discussed following her structured meal plan "90%" of the time. [name] acknowledged experiencing moments of "not wanting to follow" her structured meal plan, adding, "I've managed to talk to myself out of it." Further explored and processed. Notably, Gloria Smith discussed challenges with caloric intake during trivia night. Psychoeducation regarding triggers for emotional eating was provided. [name] was provided a handout, and encouraged to utilize the handout between now and the next appointment to increase awareness of triggers and frequency. [name] agreed. This provider also discussed behavioral strategies for specific triggers, such as placing the utensil down when conversing to avoid mindless eating. Gloria Smith provided verbal consent during today's appointment for this provider to send a handout about triggers via e-mail.  Overall, Gloria Smith was receptive to today's appointment as evidenced by openness to sharing, responsiveness to feedback, and willingness to explore triggers for emotional eating.  Mental Status Examination:  Appearance: neat Behavior: appropriate to circumstances Mood: neutral Affect: mood congruent Speech: WNL Eye Contact: appropriate Psychomotor Activity: WNL Gait: unable to assess Thought Process: linear, logical, and goal directed and denies suicidal, homicidal, and self-harm ideation, plan and intent since the last appointment with this provider Thought Content/Perception: no hallucinations, delusions, bizarre thinking or behavior endorsed or observed Orientation: AAOx4 Memory/Concentration: memory, attention, language, and fund of knowledge intact  Insight: fair Judgment: fair  Interventions:  Conducted a brief chart review Conducted a risk assessment Provided empathic reflections and validation Provided positive reinforcement Employed supportive psychotherapy interventions to facilitate reduced distress and to improve coping skills with identified stressors Psychoeducation provided regarding triggers for emotional eating behaviors  DSM-5 Diagnosis(es):  F50.89 Other Specified Feeding or Eating Disorder, Emotional Eating Behaviors, F33.1 Major Depressive Disorder, Recurrent Episode, Moderate, With Anxious Distress, and F90.9 Unspecified Attention-Deficit/Hyperactivity Disorder   Treatment Goal & Progress: During the initial appointment with this provider, the following treatment goal was established: increase coping skills. Progress is limited, as Gloria Smith has just begun treatment with this provider; however, she is receptive to the interaction and interventions and rapport is being established.   Plan: The next appointment is scheduled for 01/17/2023 at 8am, which will be via [place] Visit. The next session will focus on working towards the established treatment goal. Additionally,  Gloria Smith spoke with Gloria Smith and she will complete their new patient paperwork to schedule an appointment. Additional referral options were also shared via e-mail with Gloria Smith's verbal consent.

## 2023-01-10 ENCOUNTER — Encounter: Payer: Self-pay | Admitting: Hematology

## 2023-01-10 NOTE — Therapy (Signed)
OUTPATIENT PHYSICAL THERAPY LOWER EXTREMITY EVALUATION   Patient Name: Gloria Smith MRN: [id number] DOB:[birth year], 52 y.o., female Today's Date: 01/12/2023  END OF SESSION:  PT End of Session - 01/11/23 0906     Visit Number 1    Number of Visits 8    Date for PT Re-Evaluation 02/22/23    Authorization Type [employer]    PT Start Time 0805    PT Stop Time 0855    PT Time Calculation (min) 50 min    Activity Tolerance Patient tolerated treatment well    Behavior During Therapy [facility] for tasks assessed/performed             Past Medical History:  Diagnosis Date   Anxiety    Arthritis    Back pain    Bilateral swelling of feet and ankles    Constipation    Depression    Headache    ocular migraines - very rare   Heartburn    Joint pain    Kidney infection    Malignant neoplasm of upper-outer quadrant of left breast in female, estrogen receptor negative ([place]) 09/10/2016   pt states that cancer is on left breast, not right   Pre-diabetes    SOB (shortness of breath)    Urinary frequency    Vitamin D deficiency    Past Surgical History:  Procedure Laterality Date   BREAST LUMPECTOMY     BREAST LUMPECTOMY WITH RADIOACTIVE SEED AND SENTINEL LYMPH NODE BIOPSY Left 10/05/2016   Procedure: LEFT BREAST RADIOACTIVE SEED GUIDED LUMPECTOMY, LEFT AXILLARY RADIOACTIVE SEED GUIDED NODE EXCISION, LEFT AXILLARY SENTINEL LYMPH NODE BIOPSY(LEFT TARGETED AXILLARY DISSECTION);  Surgeon: Rolm Bookbinder, MD;  Location: [place];  Service: General;  Laterality: Left;   DENTAL SURGERY     DILATION AND CURETTAGE OF UTERUS     LAPAROSCOPIC BILATERAL SALPINGO OOPHERECTOMY Bilateral 04/14/2017   Procedure: LAPAROSCOPIC BILATERAL SALPINGO OOPHORECTOMY;  Surgeon: Aloha Gell, MD;  Location: [facility];  Service: Gynecology;  Laterality: Bilateral;   right knee surgery at age of 77      Patient Active Problem List   Diagnosis Date Noted   Vitamin D deficiency 12/23/2022   Other insomnia  12/23/2022   Insulin resistance 12/23/2022   Other fatigue 12/09/2022   SOBOE (shortness of breath on exertion) 12/09/2022   Depression 12/09/2022   Chronic pain of left knee 12/09/2022   Depression screen 12/09/2022   Pre-diabetes 11/09/2022   History of left breast cancer 11/09/2022   Hyperlipidemia 06/10/2021   Class 1 obesity with body mass index (BMI) of 34.0 to 34.9 in adult 06/10/2021   Essential (hemorrhagic) thrombocythemia ([place]) 06/10/2021   Snoring 06/04/2020   Genetic testing 10/04/2016   Breast cancer of upper-outer quadrant of left female breast ([place]) 09/10/2016     REFERRING PROVIDER: Persons, Bevely Palmer, PA   REFERRING DIAG: [phone] (ICD-10-CM) - Left knee pain, unspecified chronicity  THERAPY DIAG:  Left knee pain, unspecified chronicity  Muscle weakness (generalized)  Rationale for Evaluation and Treatment: Rehabilitation  ONSET DATE: MD visit 12/13/2022  SUBJECTIVE:   SUBJECTIVE STATEMENT: Pt reports having L knee pain for 1 year with no specific injury.  Her pain has progressively worsened and has become more constant.   Pt saw MD on 12/13/2022.  PT ordered x rays and MD note indicates some earlier medial compartment arthritis.  MD prescribed meloxicam and pt reports improved pain with meloxicam.  MD ordered PT and PT order indicated L knee arthritis.  Pt was trying  to get an exercise plan with weight management but is limited due to pain.  Pt is a member at [employer].  Pt is limited with ambulation distance due to L knee pain.  Pt states she is unable to ride an outdoor or indoor bike.  Pt has pain with standing up after sitting for awhile.  She is able to kneel, but not for long.  Pt is able to perform stairs but notices pain after a long flight of stairs.  Pt is not walking her dogs.  Pt states her R knee started bothering her a few days ago.    PERTINENT HISTORY: Back pain, depression and anxiety, SOB and has an inhaler, Hx of Breast CA R knee surgery  when she was 18  PAIN:  Are you having pain? Yes NPRS:  1/10 current and best, "close to a 10/10" worst  PRECAUTIONS: Other: SOB and has an inhaler  WEIGHT BEARING RESTRICTIONS: No  FALLS:  Has patient fallen in last 6 months? 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   PLOF: Independent.  Pt has had progressively worsening pain which has limited her functional mobility.   PATIENT GOALS: to improve pain, increased walking distance, walk her dogs, increase tolerance for exercise routine    OBJECTIVE:   DIAGNOSTIC FINDINGS: X rays on 12/13/2022:  She does have some early sclerotic changes especially in the medial compartment. Slight joint space narrowing. Well-maintained alignment no acute osseous injuries noted   PATIENT SURVEYS:  FOTO 50 with a goal of 64 at visit #11  COGNITION: Overall cognitive status: Within functional limits for tasks assessed      PALPATION: TTP:  medial and lateral jt line and patellar tendon in L knee  LOWER EXTREMITY ROM:  Active ROM Right eval Left eval  Hip flexion    Hip extension    Hip abduction    Hip adduction    Hip internal rotation    Hip external rotation    Knee flexion AROM/PROM:  111/119 123  Knee extension 3 deg of hyperextension 3 deg of hyperextension  Ankle dorsiflexion    Ankle plantarflexion    Ankle inversion    Ankle eversion     (Blank rows = not tested)  LOWER EXTREMITY MMT:  MMT Right eval Left eval  Hip flexion 5/5 3+/5  Hip extension    Hip abduction 3+/5 3+/5  Hip adduction    Hip internal rotation    Hip external rotation    Knee flexion 5/5, seated 4-/5, seated   Knee extension 5/5 Unable to tolerate resistance  Ankle dorsiflexion    Ankle plantarflexion    Ankle inversion    Ankle  eversion     (Blank rows = not tested)    GAIT: Assistive device utilized: None Level of assistance: Complete Independence Comments: pt ambulates with a normalized heel to toe gait without limping.  Pt reports having pain when turning.    TODAY'S TREATMENT:  Pt performed quad sets with 5 sec hold x 10 reps, supine SLR x 10 reps, supine heel slides x 10 reps, and supine clams with RTB x10 reps.  Pt received a HEP handout and was educated in correct form and appropriate frequency.  Pt instructed to not perform heel slide into painful range.  Pt instructed she should not have increased pain with HEP.   PATIENT EDUCATION:  Education details: dx, objective findings, HEP, POC, relevant anatomy, and prognosis.   Person educated: Patient Education method: Explanation, Demonstration, Tactile cues, Verbal cues, and Handouts Education comprehension: verbalized understanding, returned demonstration, verbal cues required, tactile cues required, and needs further education  HOME EXERCISE PROGRAM: Access Code: [email or web address] URL: [email or web address] Date: 01/12/2023 Prepared by: Ronny Flurry  Exercises - Supine Quadricep Sets  - 2 x daily - 7 x weekly - 2 sets - 10 reps - 5 seconds hold - Supine Active Straight Leg Raise  - 1 x daily - 5-7 x weekly - 2 sets - 10 reps - Supine Heel Slide  - 2 x daily - 7 x weekly - 1-2 sets - 10 reps - Hooklying Clamshell with Resistance  - 1 x daily - 3-4 x weekly - 2 sets - 10 reps  ASSESSMENT:  CLINICAL IMPRESSION: Patient is a 52 y.o. female with a dx of L knee pain with x ray findings of early OA.  Pt has muscle weakness in L hip and knee and in bilat glute medius.  Pt is limited with ambulation distance and is not walking her dogs due to L knee pain.  Pt has pain with standing up after sitting for awhile.  She is able to  perform stairs but notices pain after a long flight of stairs.  Pt wants to be more active to lose weight though is limited with activity due to knee pain.  Pt should benefit from skilled PT services to address impairments and to improve overall function.   OBJECTIVE IMPAIRMENTS: decreased activity tolerance, decreased endurance, decreased mobility, difficulty walking, decreased ROM, decreased strength, and pain.   ACTIVITY LIMITATIONS: bending, sitting, standing, squatting, stairs, transfers, and locomotion level  PARTICIPATION LIMITATIONS: community activity and walking dog  PERSONAL FACTORS: 1-2 comorbidities: back pain and SOB  are also affecting patient's functional outcome.   REHAB POTENTIAL: Good  CLINICAL DECISION MAKING: Stable/uncomplicated  EVALUATION COMPLEXITY: Low   GOALS:   SHORT TERM GOALS: Target date:  02/01/2023  Pt will be independent and compliant with HEP for improved pain, strength, and function.  Baseline: Goal status: INITIAL  2.  Pt will report at least a 25% improvement in pain and sx's overall.  Baseline:  Goal status: INITIAL    LONG TERM GOALS: Target date: 02/22/2023  Pt will demo improved L hip and knee strength to 5/5 except 4+/5 in abd for improved performance of functional mobility with reduced pain.  Baseline:  Goal status: INITIAL  2.  Pt will be able to walk her dogs without significant pain.  Baseline:  Goal status: INITIAL  3.  Pt will report she is able to perform extended community ambulation without significant pain and difficulty.  Baseline:  Goal status: INITIAL    PLAN:  PT FREQUENCY: 1-2x/week  PT DURATION: 6 weeks  PLANNED INTERVENTIONS: Therapeutic exercises, Therapeutic activity, Neuromuscular re-education, Balance training, Gait training, Patient/Family education, Self Care, Joint mobilization, Stair training, Aquatic Therapy, Dry Needling, Electrical stimulation, Cryotherapy, Moist heat, Taping, Ultrasound, Manual  therapy, and Re-evaluation  PLAN FOR NEXT SESSION: Review and  perform HEP.  Cont with hip and knee strengthening.   Selinda Michaels III PT, DPT 01/12/23 5:06 PM

## 2023-01-11 ENCOUNTER — Ambulatory Visit (HOSPITAL_BASED_OUTPATIENT_CLINIC_OR_DEPARTMENT_OTHER): Payer: 59 | Attending: Physician Assistant | Admitting: Physical Therapy

## 2023-01-11 ENCOUNTER — Other Ambulatory Visit: Payer: Self-pay

## 2023-01-11 DIAGNOSIS — M25562 Pain in left knee: Secondary | ICD-10-CM | POA: Insufficient documentation

## 2023-01-11 DIAGNOSIS — M6281 Muscle weakness (generalized): Secondary | ICD-10-CM | POA: Insufficient documentation

## 2023-01-12 ENCOUNTER — Encounter (HOSPITAL_BASED_OUTPATIENT_CLINIC_OR_DEPARTMENT_OTHER): Payer: Self-pay | Admitting: Physical Therapy

## 2023-01-13 ENCOUNTER — Other Ambulatory Visit: Payer: Self-pay | Admitting: Physician Assistant

## 2023-01-13 ENCOUNTER — Ambulatory Visit (INDEPENDENT_AMBULATORY_CARE_PROVIDER_SITE_OTHER): Payer: 59 | Admitting: Family Medicine

## 2023-01-13 ENCOUNTER — Encounter (INDEPENDENT_AMBULATORY_CARE_PROVIDER_SITE_OTHER): Payer: Self-pay | Admitting: Family Medicine

## 2023-01-13 VITALS — BP 105/73 | HR 82 | Temp 98.2°F | Ht 64.0 in | Wt 199.0 lb

## 2023-01-13 DIAGNOSIS — E559 Vitamin D deficiency, unspecified: Secondary | ICD-10-CM

## 2023-01-13 DIAGNOSIS — Z6834 Body mass index (BMI) 34.0-34.9, adult: Secondary | ICD-10-CM

## 2023-01-13 DIAGNOSIS — F3289 Other specified depressive episodes: Secondary | ICD-10-CM

## 2023-01-13 DIAGNOSIS — K5901 Slow transit constipation: Secondary | ICD-10-CM | POA: Diagnosis not present

## 2023-01-13 DIAGNOSIS — R7303 Prediabetes: Secondary | ICD-10-CM | POA: Diagnosis not present

## 2023-01-13 MED ORDER — BUPROPION HCL ER (SR) 100 MG PO TB12: 100.0000 mg | ORAL_TABLET | Freq: Two times a day (BID) | ORAL | 0 refills | Status: DC

## 2023-01-13 MED ORDER — METFORMIN HCL ER 500 MG PO TB24: 500.0000 mg | ORAL_TABLET | Freq: Every day | ORAL | 0 refills | Status: DC

## 2023-01-13 MED ORDER — VITAMIN D (ERGOCALCIFEROL) 1.25 MG (50000 UNIT) PO CAPS: 50000.0000 [IU] | ORAL_CAPSULE | ORAL | 0 refills | Status: DC

## 2023-01-13 NOTE — Assessment & Plan Note (Signed)
Improving with cognitive behavioral therapy with Dr. Mallie Mussel Improving with use of Wellbutrin SR 100 mg twice daily without adverse side effect  Continue to work on stress reduction, mindful eating and adequate sleep at night

## 2023-01-13 NOTE — Assessment & Plan Note (Addendum)
Lab Results  Component Value Date   HGBA1C 6.0 (H) 12/09/2022    Doing well on metformin 500 mg once daily with food without abdominal pain or diarrhea.  She has reduced her intake of sugar and plans to increase exercise once completed with physical therapy for knee pain  Recheck A1c, BMP and B12 level in the next 2 months

## 2023-01-13 NOTE — Assessment & Plan Note (Signed)
Reports no bowel movement in 3 days Likely worsened by high-protein diet and lack of adequate water intake Encouraged increasing water intake to at least 64 ounces a day, adding in a fresh fruit serving with breakfast and plenty of nonstarchy vegetables with dinner  She may use over-the-counter Dulcolax as needed

## 2023-01-13 NOTE — Progress Notes (Signed)
Office: [phone]  /  Fax: [phone]  WEIGHT SUMMARY AND BIOMETRICS  Medical Weight Loss Height: 5' 4"$  (1.626 m) Weight: 199 lb (90.3 kg) Temp: 98.2 F (36.8 C) Pulse Rate: 82 BP: 105/73 SpO2: 97 % Fasting: no Labs: no Today's Visit #: 3 Weight at Last VIsit: 208lb Weight Lost Since Last Visit: 9lb  Body Fat %: 41.8 % Fat Mass (lbs): 83.2 lbs Muscle Mass (lbs): 110 lbs Total Body Water (lbs): 75.8 lbs Visceral Fat Rating : 11 Starting Date: 12/09/22 Starting Weight: 208lb Total Weight Loss (lbs): 9 lb (4.082 kg)    HPI  Chief Complaint: OBESITY  Gloria Smith is here to discuss her progress with her obesity treatment plan. She is on the the Category 2 Plan and states she is following her eating plan approximately 95 % of the time. She states she is not exercising.    Interval History:  Since last office visit she is down 9 lb This gives her a net total weight loss of 9 lb in the past month Appetite has reduced Good support at home Allows a 'cheat meal' once a week while at [place] night She denies meal skipping Additional lunch options and recipe handouts given She is in PT for her knee and plans to increase exercise after that  Down 7.8 lb of body fat and down 1.8 lb of muscle mass   Pharmacotherapy: metformin, wellbutrin  PHYSICAL EXAM:  Blood pressure 105/73, pulse 82, temperature 98.2 F (36.8 C), height 5' 4"$  (1.626 m), weight 199 lb (90.3 kg), SpO2 97 %. Body mass index is 34.16 kg/m.  General: She is overweight, cooperative, alert, well developed, and in no acute distress. PSYCH: Has normal mood, affect and thought process.   HEENT: EOMI, sclerae are anicteric. Lungs: Normal breathing effort, no conversational dyspnea. Extremities: No edema.  Neurologic: No gross sensory or motor deficits. No tremors or fasciculations noted.    DIAGNOSTIC DATA REVIEWED:  BMET    Component Value Date/Time   NA 139 12/30/2022 0818   NA 141 11/24/2017 0900    K 4.7 12/30/2022 0818   K 4.2 11/24/2017 0900   CL 105 12/30/2022 0818   CO2 28 12/30/2022 0818   CO2 27 11/24/2017 0900   GLUCOSE 107 (H) 12/30/2022 0818   GLUCOSE 82 11/24/2017 0900   BUN 19 12/30/2022 0818   BUN 14.7 11/24/2017 0900   CREATININE 0.97 12/30/2022 0818   CREATININE 0.8 11/24/2017 0900   CALCIUM 9.5 12/30/2022 0818   CALCIUM 9.4 11/24/2017 0900   GFRNONAA >60 12/30/2022 0818   GFRAA >60 08/20/2020 0740   Lab Results  Component Value Date   HGBA1C 6.0 (H) 12/09/2022   HGBA1C 5.9 09/04/2019   Lab Results  Component Value Date   INSULIN 35.6 (H) 12/09/2022   Lab Results  Component Value Date   TSH 1.260 12/09/2022   CBC    Component Value Date/Time   WBC 10.7 (H) 12/30/2022 0818   WBC 9.2 12/08/2021 1038   RBC 4.87 12/30/2022 0818   HGB 13.9 12/30/2022 0818   HGB 14.0 11/24/2017 0900   HCT 42.7 12/30/2022 0818   HCT 42.9 11/24/2017 0900   PLT 456 (H) 12/30/2022 0818   PLT 479 (H) 11/24/2017 0900   MCV 87.7 12/30/2022 0818   MCV 86.6 11/24/2017 0900   MCH 28.5 12/30/2022 0818   MCHC 32.6 12/30/2022 0818   RDW 13.3 12/30/2022 0818   RDW 13.7 11/24/2017 0900   Iron Studies    Component  Value Date/Time   IRON 102 06/02/2021 0853   TIBC 314 06/02/2021 0853   FERRITIN 78 06/02/2021 0853   IRONPCTSAT 32 06/02/2021 0853   Lipid Panel     Component Value Date/Time   CHOL 204 (H) 07/20/2022 1102   TRIG 93.0 07/20/2022 1102   HDL 55.20 07/20/2022 1102   CHOLHDL 4 07/20/2022 1102   VLDL 18.6 07/20/2022 1102   LDLCALC 130 (H) 07/20/2022 1102   Hepatic Function Panel     Component Value Date/Time   PROT 7.1 12/30/2022 0818   PROT 7.0 11/24/2017 0900   ALBUMIN 4.2 12/30/2022 0818   ALBUMIN 3.5 11/24/2017 0900   AST 17 12/30/2022 0818   AST 15 11/24/2017 0900   ALT 20 12/30/2022 0818   ALT 31 11/24/2017 0900   ALKPHOS 151 (H) 12/30/2022 0818   ALKPHOS 163 (H) 11/24/2017 0900   BILITOT 0.3 12/30/2022 0818   BILITOT 0.22 11/24/2017 0900       Component Value Date/Time   TSH 1.260 12/09/2022 0946   Nutritional Lab Results  Component Value Date   VD25OH 25.8 (L) 12/09/2022     ASSESSMENT AND PLAN  TREATMENT PLAN FOR OBESITY:  Recommended Dietary Goals  Gloria Smith is currently in the action stage of change. As such, her goal is to continue weight management plan. She has agreed to the Category 2 Plan.  Behavioral Intervention  We discussed the following Behavioral Modification Strategies today: increasing lean protein intake, increasing vegetables, increase water intake, work on meal planning and easy cooking plans, and think about ways to increase physical activity.  Additional resources provided today: NA  Recommended Physical Activity Goals  Gloria Smith has been advised to work up to 150 minutes of moderate intensity aerobic activity a week and strengthening exercises 2-3 times per week for cardiovascular health, weight loss maintenance and preservation of muscle mass.   She has agreed to Patient also encouraged on scheduling and tracking physical activity.    Pharmacotherapy We discussed various medication options to help Gloria Smith with her weight loss efforts and we both agreed to continuing wellbutrin and metformin.  ASSOCIATED CONDITIONS ADDRESSED TODAY  Pre-diabetes Assessment & Plan: Lab Results  Component Value Date   HGBA1C 6.0 (H) 12/09/2022    Doing well on metformin 500 mg once daily with food without abdominal pain or diarrhea.  She has reduced her intake of sugar and plans to increase exercise once completed with physical therapy for knee pain  Recheck A1c, BMP and B12 level in the next 2 months    Orders: -     metFORMIN HCl ER; Take 1 tablet (500 mg total) by mouth daily with breakfast.  Dispense: 30 tablet; Refill: 0  Other depression with emotional eating Assessment & Plan: Improving with cognitive behavioral therapy with Dr. Mallie Mussel Improving with use of Wellbutrin SR 100 mg twice daily without  adverse side effect  Continue to work on stress reduction, mindful eating and adequate sleep at night  Orders: -     buPROPion HCl ER (SR); Take 1 tablet (100 mg total) by mouth 2 (two) times daily.  Dispense: 60 tablet; Refill: 0  Vitamin D deficiency -     Vitamin D (Ergocalciferol); Take 1 capsule (50,000 Units total) by mouth every 7 (seven) days.  Dispense: 5 capsule; Refill: 0  Obesity,current BMI 34.2  Slow transit constipation Assessment & Plan: Reports no bowel movement in 3 days Likely worsened by high-protein diet and lack of adequate water intake Encouraged increasing water intake  to at least 64 ounces a day, adding in a fresh fruit serving with breakfast and plenty of nonstarchy vegetables with dinner  She may use over-the-counter Dulcolax as needed    Morbid obesity ()      No follow-ups on file.Marland Kitchen She was informed of the importance of frequent follow up visits to maximize her success with intensive lifestyle modifications for her multiple health conditions.   ATTESTASTION STATEMENTS:  Reviewed by clinician on day of visit: allergies, medications, problem list, medical history, surgical history, family history, social history, and previous encounter notes.   I have personally spent 30 minutes total time today in preparation, patient care, nutritional counseling and documentation for this visit, including the following: review of clinical lab tests; review of medical tests/procedures/services.      Gloria Ponto, DO

## 2023-01-14 NOTE — Progress Notes (Signed)
Chief Complaint:   OBESITY Gloria Smith is here to discuss her progress with her obesity treatment plan along with follow-up of her obesity related diagnoses. Gloria Smith is on the Category 2 Plan and states she is following her eating plan approximately 50% of the time. Gloria Smith states she is walking 20 minutes 7 times per week.  Today's visit was #: 2 Starting weight: 208 lbs Starting date: 12/09/2022 Today's weight: 208 lbs Today's date: 12/23/2022 Total lbs lost to date: 0 Total lbs lost since last in-office visit: 0  Interim History: Gloria Smith went to [facility] for a week since her last visit. She stuck to the meal plan the week she was there. Pt doesn't care for bread on her plan. She was using granola in greek yogurt.  Starting PT for left knee pain at [facility].  Subjective:   1. Recurrent major depressive disorder, remission status unspecified (HCC) Daizie started Wellbutrin SR 100 mg- has been taking it once a day instead of twice.  Mood improving. Denies adverse side effects.  2. Vitamin D deficiency New. Discussed labs with patient today. Vitamin D level 25.8. Not taking any supplements. DEXA ordered by Wynelle Link, NP.  3. Insulin resistance New. Discussed labs with patient today. Fasting insulin elevated at 35.6. Denies family history of type 2 diabetes. Lacks regular exercise.  4. Pre-diabetes Discussed labs with patient today. A1c 6.0 and stable. Actively working on diet, exercise, and weight loss.  5. Other insomnia Has chronic insomnia, stable. Feels hungry with nighttime awakenings. Denies eating in the middle of the night.  Assessment/Plan:   1. Recurrent major depressive disorder, remission status unspecified (HCC) Referral to Dr. Dewaine Conger made for CBT. Increase Wellbutrin SR to 100 mg BID (has prescription).  2. Vitamin D deficiency Begin prescription Vitamin D 50,000 IU weekly.  Repeat labs in 3-4 months.  3. Insulin resistance Continue prescribed  diet and reducing sugar intake. Explained the metabolic consequences of insulin resistance.  4. Pre-diabetes Begin Metformin XR 500 mg daily with food. Reviewed potential adverse side effects.  5. Other insomnia Continue to work on sleep hygiene. Drink water if waking at night.  6. Obesity,current BMI 35.8 Meat Alternatives handout given. Check out [email or web address]  Gloria Smith is currently in the action stage of change. As such, her goal is to continue with weight loss efforts. She has agreed to the Category 2 Plan and 500 calories and 20+ grams of protein with dinner.   Exercise goals:  As is  Behavioral modification strategies: increasing lean protein intake, increasing vegetables, increasing water intake, decreasing eating out, no skipping meals, meal planning and cooking strategies, keeping healthy foods in the home, and decreasing junk food.  Gloria Smith has agreed to follow-up with our clinic in 4 weeks. She was informed of the importance of frequent follow-up visits to maximize her success with intensive lifestyle modifications for her multiple health conditions.   Objective:   Blood pressure 117/75, pulse 87, temperature 98.8 F (37.1 C), height 5\' 4"  (1.626 m), weight 208 lb (94.3 kg), SpO2 98 %. Body mass index is 35.7 kg/m.  General: Cooperative, alert, well developed, in no acute distress. HEENT: Conjunctivae and lids unremarkable. Cardiovascular: Regular rhythm.  Lungs: Normal work of breathing. Neurologic: No focal deficits.   Lab Results  Component Value Date   CREATININE 0.97 12/30/2022   BUN 19 12/30/2022   NA 139 12/30/2022   K 4.7 12/30/2022   CL 105 12/30/2022   CO2 28 12/30/2022   Lab Results  Component Value Date   ALT 20 12/30/2022   AST 17 12/30/2022   GGT 31 10/24/2017   ALKPHOS 151 (H) 12/30/2022   BILITOT 0.3 12/30/2022   Lab Results  Component Value Date   HGBA1C 6.0 (H) 12/09/2022   HGBA1C 6.0 07/20/2022   HGBA1C 5.9 06/10/2021   HGBA1C 5.9  09/04/2019   Lab Results  Component Value Date   INSULIN 35.6 (H) 12/09/2022   Lab Results  Component Value Date   TSH 1.260 12/09/2022   Lab Results  Component Value Date   CHOL 204 (H) 07/20/2022   HDL 55.20 07/20/2022   LDLCALC 130 (H) 07/20/2022   TRIG 93.0 07/20/2022   CHOLHDL 4 07/20/2022   Lab Results  Component Value Date   VD25OH 25.8 (L) 12/09/2022   Lab Results  Component Value Date   WBC 10.7 (H) 12/30/2022   HGB 13.9 12/30/2022   HCT 42.7 12/30/2022   MCV 87.7 12/30/2022   PLT 456 (H) 12/30/2022   Lab Results  Component Value Date   IRON 102 06/02/2021   TIBC 314 06/02/2021   FERRITIN 78 06/02/2021   Attestation Statements:   Reviewed by clinician on day of visit: allergies, medications, problem list, medical history, surgical history, family history, social history, and previous encounter notes.  I, Kyung Rudd, BS, CMA, am acting as transcriptionist for Seymour Bars, DO.   I have reviewed the above documentation for accuracy and completeness, and I agree with the above. [name], DO

## 2023-01-17 ENCOUNTER — Telehealth (INDEPENDENT_AMBULATORY_CARE_PROVIDER_SITE_OTHER): Payer: 59 | Admitting: Psychology

## 2023-01-17 DIAGNOSIS — F331 Major depressive disorder, recurrent, moderate: Secondary | ICD-10-CM

## 2023-01-17 DIAGNOSIS — F5089 Other specified eating disorder: Secondary | ICD-10-CM | POA: Diagnosis not present

## 2023-01-17 DIAGNOSIS — F909 Attention-deficit hyperactivity disorder, unspecified type: Secondary | ICD-10-CM | POA: Diagnosis not present

## 2023-01-17 NOTE — Progress Notes (Signed)
  Office: [phone]  /  Fax: [phone]    Date: January 17, 2023    Appointment Start Time: 7:58am Duration: 32 minutes Provider: Glennie Isle, Psy.D. Type of Session: Individual Therapy  Location of Patient: Home (private location) Location of Provider: Provider's Home (private office) Type of Contact: Telepsychological Visit via MyChart Video Visit  Session Content: Gloria Smith is a 52 y.o. female presenting for a follow-up appointment to address the previously established treatment goal of increasing coping skills.Today's appointment was a telepsychological visit. Gloria Smith provided verbal consent for today's telepsychological appointment and she is aware she is responsible for securing confidentiality on her end of the session. Prior to proceeding with today's appointment, Gloria Smith's physical location at the time of this appointment was obtained as well a phone number she could be reached at in the event of technical difficulties. Gloria Smith and this provider participated in today's telepsychological service.   This provider conducted a brief check-in. Gloria Smith stated, "I'm doing great." She discussed losing weight, noting she will "cheat" on trivia night. Further explored and processed. Notably, she discussed engaging in portion control and making better choices when deviations occur. Positive reinforcement was provided. Reviewed triggers for emotional eating behaviors. Gloria Smith was engaged in problem solving to develop a plan to help cope with urges/cravings involving activities to relax, activities to distract, comforting places, people to call and connect with, and activities that help soothe senses. She was observed writing the plan. Overall, Gloria Smith was receptive to today's appointment as evidenced by openness to sharing, responsiveness to feedback, and willingness to implement discussed strategies .  Mental Status Examination:  Appearance: neat Behavior: appropriate to circumstances Mood:  neutral Affect: mood congruent Speech: WNL Eye Contact: appropriate Psychomotor Activity: WNL Gait: unable to assess Thought Process: linear, logical, and goal directed and denies suicidal, homicidal, and self-harm ideation, plan and intent since the last appointment with this provider  Thought Content/Perception: no hallucinations, delusions, bizarre thinking or behavior endorsed or observed Orientation: AAOx4 Memory/Concentration: memory, attention, language, and fund of knowledge intact  Insight: fair Judgment: fair  Interventions:  Conducted a brief chart review Conducted a risk assessment Provided empathic reflections and validation Reviewed content from the previous session Provided positive reinforcement Employed supportive psychotherapy interventions to facilitate reduced distress and to improve coping skills with identified stressors Engaged patient in problem solving  DSM-5 Diagnosis(es):  F50.89 Other Specified Feeding or Eating Disorder, Emotional Eating Behaviors, F33.1 Major Depressive Disorder, Recurrent Episode, Moderate, With Anxious Distress, and F90.9 Unspecified Attention-Deficit/Hyperactivity Disorder   Treatment Goal & Progress: During the initial appointment with this provider, the following treatment goal was established: increase coping skills. Gloria Smith has demonstrated progress in her goal as evidenced by increased awareness of hunger patterns and increased awareness of triggers for emotional eating behaviors. Gloria Smith also continues to demonstrate willingness to engage in learned skill(s).  Plan: The next appointment is scheduled for 01/31/2023 at 8am, which will be via [place] Visit. The next session will focus on working towards the established treatment goal. Additionally, Gloria Smith is scheduled for an initial appointment with [facility] on 01/24/2023.

## 2023-01-18 ENCOUNTER — Other Ambulatory Visit: Payer: Self-pay | Admitting: Physician Assistant

## 2023-01-18 ENCOUNTER — Ambulatory Visit (HOSPITAL_BASED_OUTPATIENT_CLINIC_OR_DEPARTMENT_OTHER): Payer: 59 | Admitting: Physical Therapy

## 2023-01-18 ENCOUNTER — Encounter (HOSPITAL_BASED_OUTPATIENT_CLINIC_OR_DEPARTMENT_OTHER): Payer: Self-pay | Admitting: Physical Therapy

## 2023-01-18 DIAGNOSIS — M25562 Pain in left knee: Secondary | ICD-10-CM

## 2023-01-18 DIAGNOSIS — M6281 Muscle weakness (generalized): Secondary | ICD-10-CM

## 2023-01-18 MED ORDER — MELOXICAM 15 MG PO TABS: 15.0000 mg | ORAL_TABLET | Freq: Every day | ORAL | 0 refills | Status: DC

## 2023-01-18 NOTE — Therapy (Signed)
OUTPATIENT PHYSICAL THERAPY TREATMENT [facility]   Patient Name: Gloria Smith MRN: [id number] DOB:09/13/1971, 52 y.o., female Today's Date: 01/18/2023  END OF SESSION:  PT End of Session - 01/18/23 0945     Visit Number 2    Number of Visits 8    Date for PT Re-Evaluation 02/22/23    Authorization Type [employer]    PT Start Time 0939    PT Stop Time 1031    PT Time Calculation (min) 52 min    Activity Tolerance Patient tolerated treatment well   Pt had increased pain   Behavior During Therapy WFL for tasks assessed/performed             Past Medical History:  Diagnosis Date   Anxiety    Arthritis    Back pain    Bilateral swelling of feet and ankles    Constipation    Depression    Headache    ocular migraines - very rare   Heartburn    Joint pain    Kidney infection    Malignant neoplasm of upper-outer quadrant of left breast in female, estrogen receptor negative ([place]) 09/10/2016   pt states that cancer is on left breast, not right   Pre-diabetes    SOB (shortness of breath)    Urinary frequency    Vitamin D deficiency    Past Surgical History:  Procedure Laterality Date   BREAST LUMPECTOMY     BREAST LUMPECTOMY WITH RADIOACTIVE SEED AND SENTINEL LYMPH NODE BIOPSY Left 10/05/2016   Procedure: LEFT BREAST RADIOACTIVE SEED GUIDED LUMPECTOMY, LEFT AXILLARY RADIOACTIVE SEED GUIDED NODE EXCISION, LEFT AXILLARY SENTINEL LYMPH NODE BIOPSY(LEFT TARGETED AXILLARY DISSECTION);  Surgeon: Rolm Bookbinder, MD;  Location: [facility];  Service: General;  Laterality: Left;   DENTAL SURGERY     DILATION AND CURETTAGE OF UTERUS     LAPAROSCOPIC BILATERAL SALPINGO OOPHERECTOMY Bilateral 04/14/2017   Procedure: LAPAROSCOPIC BILATERAL SALPINGO OOPHORECTOMY;  Surgeon: Aloha Gell, MD;  Location: [facility];  Service: Gynecology;  Laterality: Bilateral;   right knee surgery at age of 66      Patient Active Problem List   Diagnosis Date Noted   Slow transit constipation 01/13/2023    Vitamin D deficiency 12/23/2022   Other insomnia 12/23/2022   Insulin resistance 12/23/2022   Other fatigue 12/09/2022   SOBOE (shortness of breath on exertion) 12/09/2022   Depression 12/09/2022   Chronic pain of left knee 12/09/2022   Depression screen 12/09/2022   Pre-diabetes 11/09/2022   History of left breast cancer 11/09/2022   Hyperlipidemia 06/10/2021   Morbid obesity ([place]) 06/10/2021   Essential (hemorrhagic) thrombocythemia ([place]) 06/10/2021   Snoring 06/04/2020   Genetic testing 10/04/2016   Breast cancer of upper-outer quadrant of left female breast ([place]) 09/10/2016     REFERRING PROVIDER: Persons, Bevely Palmer, [place]   REFERRING DIAG: [phone] (ICD-10-CM) - Left knee pain, unspecified chronicity  THERAPY DIAG:  Left knee pain, unspecified chronicity  Muscle weakness (generalized)  Rationale for Evaluation and Treatment: Rehabilitation  ONSET DATE: MD visit 12/13/2022  SUBJECTIVE:   SUBJECTIVE STATEMENT: Pt reports no adverse effects after prior Rx.  Pt reports compliance with HEP and denies any adverse effects with HEP.  Pt states her knee typically feels better early in the AM.  She has increased pain with increased activity.  Pt reports increased pain as the day progresses.  Pt reports she hasn't used an indoor bike in a long time due to her R knee.  She has pain  with prolonged sitting.    Pt is not walking her dogs.  Pt is limited with ambulation distance due to L knee pain.  PERTINENT HISTORY: Back pain, depression and anxiety, SOB and has an inhaler, Hx of Breast CA R knee surgery when she was 18  PAIN:  Are you having pain? Yes NPRS:  1/10 current and best, "close to a 10/10" worst  PRECAUTIONS: Other: SOB and has an inhaler  WEIGHT BEARING RESTRICTIONS: No  FALLS:  Has patient fallen in last 6 months? 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   PLOF: Independent.  Pt has had progressively worsening pain which has limited her functional mobility.   PATIENT GOALS: to improve pain, increased walking distance, walk her dogs, increase tolerance for exercise routine    OBJECTIVE:   DIAGNOSTIC FINDINGS: X rays on 12/13/2022:  She does have some early sclerotic changes especially in the medial compartment. Slight joint space narrowing. Well-maintained alignment no acute osseous injuries noted     TODAY'S TREATMENT:                                                                                                                                Reviewed response to prior Rx, pain levels, and HEP compliance. Reviewed and performed HEP.  Pt performed: -Nustep at L3 x 5 mins, just LE's -quad sets with 5 sec hold x 10 reps -supine SLR 2 x 10 reps -supine clams with RTB x10 reps -supine heel slides x 10 reps -S/L hip abd x 10 reps bilat -Standing hip abd x 10 reps bilat -Heel raises 2x10 reps -Sidestepping x 2 laps at rail -S/L clams 2x10 L L -LAQ 2x10 -seated HS curl x 10 with RTB  Modalities:  (not billed) Pt received CP to L knee in supine x 10 mins to reduce pain.   PATIENT EDUCATION:  Education details: dx,HEP, exercise form and rationale, POC, relevant anatomy, rationale of ice.   Person educated: Patient Education method: Explanation, Demonstration, Tactile cues, Verbal cues, and Handouts Education comprehension: verbalized understanding, returned demonstration, verbal cues required, tactile cues required, and needs further education  HOME EXERCISE PROGRAM: Access Code: [email or web address] URL: [email or web address] Date: 01/12/2023 Prepared by: Ronny Flurry  Exercises - Supine Quadricep Sets  - 2 x daily - 7 x weekly - 2 sets - 10 reps - 5 seconds hold - Supine Active Straight Leg Raise  - 1 x daily - 5-7 x weekly -  2 sets - 10 reps - Supine Heel Slide  - 2 x daily - 7 x weekly - 1-2 sets - 10 reps - Hooklying Clamshell with Resistance  - 1 x daily - 3-4 x weekly - 2 sets -  10 reps  ASSESSMENT:  CLINICAL IMPRESSION: Patient reports compliance with and tolerance of HEP.  PT reviewed and pt performed HEP.  Pt performed hip and LE strengthening exercises today.  Pt has weakness in bilat glute medius.  Pt didn't have c/o's with any of the exercises until the end of PT.  She reported some pain with seated HS curl and reports increased pain to 6/10 with ambulation after exercises.  Pt thinks it was the seated LAQ and seated HS curl that increased her pain as those were the last 2 exercises.  PT used ice to reduce pain and pt responded well reporting improved pain to 1/10 (her baseline) after icing. Pt should benefit from cont skilled PT services to address impairments and goals and to improve overall function.     OBJECTIVE IMPAIRMENTS: decreased activity tolerance, decreased endurance, decreased mobility, difficulty walking, decreased ROM, decreased strength, and pain.   ACTIVITY LIMITATIONS: bending, sitting, standing, squatting, stairs, transfers, and locomotion level  PARTICIPATION LIMITATIONS: community activity and walking dog  PERSONAL FACTORS: 1-2 comorbidities: back pain and SOB  are also affecting patient's functional outcome.   REHAB POTENTIAL: Good  CLINICAL DECISION MAKING: Stable/uncomplicated  EVALUATION COMPLEXITY: Low   GOALS:   SHORT TERM GOALS: Target date:  02/01/2023  Pt will be independent and compliant with HEP for improved pain, strength, and function.  Baseline: Goal status: INITIAL  2.  Pt will report at least a 25% improvement in pain and sx's overall.  Baseline:  Goal status: INITIAL    LONG TERM GOALS: Target date: 02/22/2023  Pt will demo improved L hip and knee strength to 5/5 except 4+/5 in abd for improved performance of functional mobility with reduced pain.   Baseline:  Goal status: INITIAL  2.  Pt will be able to walk her dogs without significant pain.  Baseline:  Goal status: INITIAL  3.  Pt will report she is able to perform extended community ambulation without significant pain and difficulty.  Baseline:  Goal status: INITIAL    PLAN:  PT FREQUENCY: 1-2x/week  PT DURATION: 6 weeks  PLANNED INTERVENTIONS: Therapeutic exercises, Therapeutic activity, Neuromuscular re-education, Balance training, Gait training, Patient/Family education, Self Care, Joint mobilization, Stair training, Aquatic Therapy, Dry Needling, Electrical stimulation, Cryotherapy, Moist heat, Taping, Ultrasound, Manual therapy, and Re-evaluation  PLAN FOR NEXT SESSION: Review and perform HEP.  Cont with hip and knee strengthening. Ice as needed to reduce soreness and pain.  Selinda Michaels III PT, DPT 01/18/23 3:52 PM

## 2023-01-24 ENCOUNTER — Ambulatory Visit (INDEPENDENT_AMBULATORY_CARE_PROVIDER_SITE_OTHER): Payer: 59 | Admitting: Psychology

## 2023-01-24 DIAGNOSIS — F331 Major depressive disorder, recurrent, moderate: Secondary | ICD-10-CM

## 2023-01-24 NOTE — Progress Notes (Signed)
[facility] Counselor Initial Adult Exam  Name: Gloria Smith Date: 01/24/2023 MRN: [id number] DOB: November 25, 1970 PCP: Martin[place], Betty G, MD  Time spent: 12:00pm-1:12pm  Pt is seen for a virtual video visit via caregility.  Pt joins from her home, reporting privacy, and counselor from her home office.    Guardian/Payee:  self    Paperwork requested: No   Reason for Visit /Presenting Problem: always somehting I've known I shoulddo. Being english don't talk about feelings and don't deal w/ things and suck it down.  Something not quite reight about me.  Is it ADHd, am I on the spectrum.  My mom thought something was wrong w/ me.  I was just a lazy naughty child.  Buried everything my whole life.  I have failed a lot because of some things.  I procrastinate.  I know I need to do soemthing, then I need to forget.  When I do I put in all my effort.  10 years since moved to [facility] and was on my own then.  Trying to decipher myself and what is wrong.  I started notice things.  Monotonous sounds drive me nuts.  I hated my mom vacuum the sound.  The fan on in the kitchen.  Incrased awareness.  This isn't my fault.  Reason I can't keep my mouth shut at work.  I have to tell someone when me doing something wrong.  I have a quick mind.  But I alientate w/ coworkers.  Almost 52 and having to start over again.  My weigh balooned after dx w/ cancer and one drugs effected weight gaine.  When laid off I need to take control of my wieght.  [facility].  Ended up w/ dr. Mallie Mussel.  I agree that there is something wrong and referred to ADHD assessment.  And here I am.    Comforting things that repeat.    Not good relationship w/ my mother and sister being favorite.   Older 3.5 years.  Don't talk To sister on and off since left for [place].  I would reach out.  2019 last trip that did visit.  . Sister doesn't talk to mom.  1x in three year. I fly out 3-4 times a year.  Don't have any family.   Sister lost husband 1 year ago and wouldn't allow them down.  In the past I would have exploded.  My sister used to make be explode.  When no one else around poke at things.  Did reach out when breast cancer and when husband died.  Apr 11, 2023 when [place] again for wedding.   Dad Breat cancer a few years ago.  7 weeks radiation, lymph nodes removed.  Hurts that still sister family.  I still go and visit.  Still get on and pleasant.  Suck it up.   Depression lacey borden, PA  dx.  Paxil prescribed by her.  Wasn't getting as irritated and felt a little better.  Recently wellbutrin and noticing a massive difference.  More productive p not as mych as like.  Bits if things I'm starting. I don't understand why I can't.  Motivation isnt there.  Work didn't miss deadlines.  Doing at 11th hour. Beating up self.  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  Trying ot fight as adult and trying to change for self.  Bieng able to accept that some thing is wrong.  Since high school 52y/o bullied and colelge bullied.  Not included as adult in  coworker.  Husband accepts me.  Anxiety ridden husband.  Self medicated cannabis and alcohol.  And not longer drinks.  For a lot months.  He gets me.  Just having a bad day and accepts.  Nira Conn has OCD and gravitate towards people .  My very best friend in [place] passed at end of December- ruby- own a farm.  Spending a lot of time helping [place] her husband.  Trivia team changed up.  Haven't addressed my own grief.  Flu.   I take care of everyone else so I don't have t otake care of me.   Mental Status Exam: Appearance:   Well Groomed     Behavior:  Appropriate  Motor:  Normal  Speech/Language:   Normal Rate  Affect:  Appropriate  Mood:  anxious and depressed  Thought process:  normal  Thought content:    WNL  Sensory/Perceptual disturbances:    WNL  Orientation:  oriented to person, place, time/date, and situation  Attention:  Good  Concentration:  Good  Memory:  WNL  Fund of knowledge:    Good  Insight:    Good  Judgment:   Good  Impulse Control:  Good   Reported Symptoms:  Pt reports struggles w/ motivation, initiating tasks, procrastinating and then feeling bad about/ mad at self.  Pt reports difficulty w/ acknowledging and processing her own feelings and will "stuff down".  Pt reports dx w/ depression couple years ago following her cancer dx.  Pt reports less irritiable than past w/ medication. Pt reports improved w/ current medication Wellbutrin- noticing easier to intiaite.  Pt scored 19 on PHQ 9.  Pt also endorses sleep disturbance and fatigue, half days depressed moods and loss of interest.  Pt scored 17 on GAD 7 endorsing excessive worry and difficulty relaxing.    Risk Assessment: Danger to Self:  No Self-injurious Behavior: No Danger to Others: No Duty to Warn:no Physical Aggression / Violence:No  Access to Firearms a concern: No  Gang Involvement:No  Patient / guardian was educated about steps to take if suicide or homicide risk level increases between visits: yes While future psychiatric events cannot be accurately predicted, the patient does not currently require acute inpatient psychiatric care and does not currently meet [facility] involuntary commitment criteria.  Substance Abuse History: Current substance abuse: No     Past Psychiatric History:   Previous psychological history is significant for depression Outpatient Providers:Pt has been working w/ Dr. Mallie Mussel at [facility] for support on goals w/ weight loss.   History of Psych Hospitalization: {PSY:21197} Psychological Testing: {PSY:21014032}   Abuse History:  Victim of: {Abuse History:314532}, {Type of abuse:20566}   Report needed: {PSY:314532} Victim of Neglect:{yes no:314532} Perpetrator of {PSY:20566}  Witness / Exposure to Domestic Violence: {PSY:21197}  Protective Services Involvement: {PSY:21197} Witness to [employer] Violence:  {PSY:21197}  Family History:  Family  History  Problem Relation Age of Onset   Thyroid disease Mother    Other Mother        hx precancerous skin finding removed from eyelid at 60-71   Arthritis Mother    Hypertension Father    Breast cancer Sister    Heart attack Maternal Grandmother 77   Early death Maternal Grandmother    Heart disease Maternal Grandmother    Stroke Maternal Grandfather    Cancer Paternal Grandmother    Esophageal cancer Paternal Grandmother    Heart attack Paternal Grandfather 37   Early death Paternal Grandfather    Breast  cancer Cousin        paternal 1st cousin, once-removed; dx. 60s   Other Other 25       hx of precancerous finding on abnormal pap smear   Cancer Other        maternal great aunt (MGM's sister) d. early 60s; spinal cancer, but this may have been a secondary cancer w/ NOS primary   Cancer Other        maternal great grandmother (MGM's mother) d. 58s w/ NOS cancer   Cancer Other        paternal great uncle (PGM's brother) d. 29s w/ NOS cancer   Colon cancer Neg Hx    Rectal cancer Neg Hx    Stomach cancer Neg Hx   Dad I a worrier- worst case scenario.   Back in [place].   1995 at 52y/o and came to [place] .  Summer camp in [place] and made friends.  Flew back to [place] to visit friend stayed w/ mom.  And met husband son's father.  Married for 6 years.  1996 married and fell apart 2002.  Working for [employer] and at times not paid for awhile.  Working long hours- husband is doing Electronics engineer w/ finances and speindg money and doing things I didn't know about.  Reposed and clued into things were really bad and I left him.  Blamed it all on her when talked w/ in laws.   Santiago Glad best friend.  Hermom I smy [place] mom.  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  Son should go w/ you.  W/in 6 months promoted moved to [place] for 13 years.   Coralyn Mark is a good father- flew to visit.  Worked out holidays and summers.    Living situation: the patient {lives:315711::"lives with their family"} lives together since 2018.  6  years.  2 dogs.   Sexual Orientation: {Sexual Orientation:[phone]}  Relationship Status: married 2.5 years.  Husband.   Name of spouse / other:*** If a parent, number of children / ages:***I have children.  Son Legrand Como.  Lives in [place]. Almost 9 years.  Dad lived out their.  Lived in [place] for 8 years, tx for years.  Moved w/ his dad for last year of high school.  Move to [place].  Since 53 y/o.  Lived.  Something unresolved w/ him until Jan 2024.  When he told wasn't against her.  Helped him get settled into new apartment.    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  Working maybe temp work.  Currently not working.  Oct 22 2022.  Paid severance.  Not to look for work until [employer].    Military Service: [id number]  Educational History: Education: {PSY :31912}  Religion/Sprituality/World View: {CHL AMB RELIGION/[phone]}  Any cultural differences that may affect / interfere with treatment:  {Religious/Cultural:200019}  Recreation/Hobbies: {Woc hobbies:30428}  Stressors: {PATIENT STRESSORS:22669}  Strengths: {Patient Coping Strengths:[phone]}  Barriers:  ***   Legal History: Pending legal issue / charges: {PSY:20588} History of legal issue / charges: {Legal Issues:[phone]}  Medical History/Surgical History: {Desc; reviewed/not reviewed:60074} Past Medical History:  Diagnosis Date   Anxiety    Arthritis    Back pain    Bilateral swelling of feet and ankles    Constipation    Depression    Headache    ocular migraines - very rare   Heartburn    Joint pain    Kidney infection  Malignant neoplasm of upper-outer quadrant of left breast in female, estrogen receptor negative ([place]) 09/10/2016   pt states that cancer is on left breast, not right   Pre-diabetes    SOB (shortness of breath)    Urinary frequency     Vitamin D deficiency     Past Surgical History:  Procedure Laterality Date   BREAST LUMPECTOMY     BREAST LUMPECTOMY WITH RADIOACTIVE SEED AND SENTINEL LYMPH NODE BIOPSY Left 10/05/2016   Procedure: LEFT BREAST RADIOACTIVE SEED GUIDED LUMPECTOMY, LEFT AXILLARY RADIOACTIVE SEED GUIDED NODE EXCISION, LEFT AXILLARY SENTINEL LYMPH NODE BIOPSY(LEFT TARGETED AXILLARY DISSECTION);  Surgeon: Rolm Bookbinder, MD;  Location: [facility];  Service: General;  Laterality: Left;   DENTAL SURGERY     DILATION AND CURETTAGE OF UTERUS     LAPAROSCOPIC BILATERAL SALPINGO OOPHERECTOMY Bilateral 04/14/2017   Procedure: LAPAROSCOPIC BILATERAL SALPINGO OOPHORECTOMY;  Surgeon: Aloha Gell, MD;  Location: [facility];  Service: Gynecology;  Laterality: Bilateral;   right knee surgery at age of 20       Medications: Current Outpatient Medications  Medication Sig Dispense Refill   aspirin EC 81 MG tablet Take 81 mg by mouth daily. Swallow whole.     benzonatate (TESSALON PERLES) 100 MG capsule Take 1 capsule (100 mg total) by mouth 3 (three) times daily as needed. 20 capsule 0   buPROPion ER (WELLBUTRIN SR) 100 MG 12 hr tablet Take 1 tablet (100 mg total) by mouth 2 (two) times daily. 60 tablet 0   exemestane (AROMASIN) 25 MG tablet TAKE 1 TABLET(25 MG) BY MOUTH DAILY AFTER BREAKFAST 90 tablet 3   Ipratropium-Albuterol (COMBIVENT RESPIMAT) 20-100 MCG/ACT AERS respimat Inhale 1 puff into the lungs every 6 (six) hours as needed for wheezing. 4 g 2   meloxicam (MOBIC) 15 MG tablet Take 1 tablet (15 mg total) by mouth daily. 30 tablet 0   metFORMIN (GLUCOPHAGE-XR) 500 MG 24 hr tablet Take 1 tablet (500 mg total) by mouth daily with breakfast. 30 tablet 0   PARoxetine (PAXIL) 20 MG tablet TAKE 1 TABLET(20 MG) BY MOUTH DAILY 90 tablet 2   Vitamin D, Ergocalciferol, (DRISDOL) 1.25 MG (50000 UNIT) CAPS capsule Take 1 capsule (50,000 Units total) by mouth every 7 (seven) days. 5 capsule 0   No current facility-administered  medications for this visit.    No Known Allergies  Diagnoses:  No diagnosis found.  Plan of Care: Jan Fireman, [facility]

## 2023-01-24 NOTE — Progress Notes (Unsigned)
                Baleria Wyman, LCMHC 

## 2023-01-25 ENCOUNTER — Encounter (HOSPITAL_BASED_OUTPATIENT_CLINIC_OR_DEPARTMENT_OTHER): Payer: Self-pay | Admitting: Physical Therapy

## 2023-01-25 ENCOUNTER — Ambulatory Visit (HOSPITAL_BASED_OUTPATIENT_CLINIC_OR_DEPARTMENT_OTHER): Payer: 59 | Attending: Physician Assistant | Admitting: Physical Therapy

## 2023-01-25 DIAGNOSIS — M6281 Muscle weakness (generalized): Secondary | ICD-10-CM | POA: Insufficient documentation

## 2023-01-25 DIAGNOSIS — M25562 Pain in left knee: Secondary | ICD-10-CM | POA: Diagnosis present

## 2023-01-25 NOTE — Therapy (Signed)
OUTPATIENT PHYSICAL THERAPY TREATMENT NOE   Patient Name: Gloria Smith MRN: [id number] DOB:1971-06-03, 52 y.o., female Today's Date: 01/25/2023  END OF SESSION:  PT End of Session - 01/25/23 0804     Visit Number 3    Number of Visits 8    Date for PT Re-Evaluation 02/22/23    Authorization Type UHC    PT Start Time 0803    PT Stop Time 0845    PT Time Calculation (min) 42 min    Activity Tolerance Patient tolerated treatment well    Behavior During Therapy [facility] for tasks assessed/performed             Past Medical History:  Diagnosis Date   Anxiety    Arthritis    Back pain    Bilateral swelling of feet and ankles    Constipation    Depression    Headache    ocular migraines - very rare   Heartburn    Joint pain    Kidney infection    Malignant neoplasm of upper-outer quadrant of left breast in female, estrogen receptor negative ([place]) 09/10/2016   pt states that cancer is on left breast, not right   Pre-diabetes    SOB (shortness of breath)    Urinary frequency    Vitamin D deficiency    Past Surgical History:  Procedure Laterality Date   BREAST LUMPECTOMY     BREAST LUMPECTOMY WITH RADIOACTIVE SEED AND SENTINEL LYMPH NODE BIOPSY Left 10/05/2016   Procedure: LEFT BREAST RADIOACTIVE SEED GUIDED LUMPECTOMY, LEFT AXILLARY RADIOACTIVE SEED GUIDED NODE EXCISION, LEFT AXILLARY SENTINEL LYMPH NODE BIOPSY(LEFT TARGETED AXILLARY DISSECTION);  Surgeon: Rolm Bookbinder, MD;  Location: [facility];  Service: General;  Laterality: Left;   DENTAL SURGERY     DILATION AND CURETTAGE OF UTERUS     LAPAROSCOPIC BILATERAL SALPINGO OOPHERECTOMY Bilateral 04/14/2017   Procedure: LAPAROSCOPIC BILATERAL SALPINGO OOPHORECTOMY;  Surgeon: Aloha Gell, MD;  Location: [facility];  Service: Gynecology;  Laterality: Bilateral;   right knee surgery at age of 36      Patient Active Problem List   Diagnosis Date Noted   Slow transit constipation 01/13/2023   Vitamin D deficiency  12/23/2022   Other insomnia 12/23/2022   Insulin resistance 12/23/2022   Other fatigue 12/09/2022   SOBOE (shortness of breath on exertion) 12/09/2022   Depression 12/09/2022   Chronic pain of left knee 12/09/2022   Depression screen 12/09/2022   Pre-diabetes 11/09/2022   History of left breast cancer 11/09/2022   Hyperlipidemia 06/10/2021   Morbid obesity ([place]) 06/10/2021   Essential (hemorrhagic) thrombocythemia ([place]) 06/10/2021   Snoring 06/04/2020   Genetic testing 10/04/2016   Breast cancer of upper-outer quadrant of left female breast ([place]) 09/10/2016     REFERRING PROVIDER: Persons, Bevely Palmer, [place]   REFERRING DIAG: [phone] (ICD-10-CM) - Left knee pain, unspecified chronicity  THERAPY DIAG:  Left knee pain, unspecified chronicity  Muscle weakness (generalized)  Rationale for Evaluation and Treatment: Rehabilitation  ONSET DATE: MD visit 12/13/2022  SUBJECTIVE:   SUBJECTIVE STATEMENT: Pt states she felt great after prior Rx.  Pt denies pain currently.  Pt states she was able to walk the dogs on Saturday and had no significant pain, probably a 1/10.  Pt reports compliance with HEP.    PERTINENT HISTORY: Back pain, depression and anxiety, SOB and has an inhaler, Hx of Breast CA R knee surgery when she was 18  PAIN:  Are you having pain? Yes NPRS:  0/10 current  and best, "close to a 10/10" worst  PRECAUTIONS: Other: SOB and has an inhaler  WEIGHT BEARING RESTRICTIONS: No  FALLS:  Has patient fallen in last 6 months? 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   PLOF: Independent.  Pt has had progressively worsening pain which has limited her functional mobility.   PATIENT GOALS: to improve pain, increased walking distance, walk her dogs, increase tolerance  for exercise routine    OBJECTIVE:   DIAGNOSTIC FINDINGS: X rays on 12/13/2022:  She does have some early sclerotic changes especially in the medial compartment. Slight joint space narrowing. Well-maintained alignment no acute osseous injuries noted     TODAY'S TREATMENT:                                                                                                                                Reviewed response to prior Rx, pain levels, and HEP compliance.  Pt performed: -Nustep at L3 x 5 mins, just LE's -LAQ x 12 with 0#, 1.5# 2x10 -supine SLR 2 x 10 reps -supine clams with RTB x10 reps -supine heel slides x 10 reps -S/L hip abd 2 x 10 reps bilat -Standing hip abd x 10 reps bilat -Heel raises 2x10 reps -Sidestepping x 1 lap at rail and with RTB around thighs x 1 lap at rail -S/L clams 2x10 L L -marching on airex 2x10 without UE support  -Pt received L knee flexion and extension PROM in supine.    PATIENT EDUCATION:  Education details: dx,HEP, exercise form and rationale, POC, relevant anatomy, rationale of ice.   Person educated: Patient Education method: Explanation, Demonstration, Tactile cues, Verbal cues, and Handouts Education comprehension: verbalized understanding, returned demonstration, verbal cues required, tactile cues required, and needs further education  HOME EXERCISE PROGRAM: Access Code: [email or web address] URL: [email or web address] Date: 01/12/2023 Prepared by: Ronny Flurry  Exercises - Supine Quadricep Sets  - 2 x daily - 7 x weekly - 2 sets - 10 reps - 5 seconds hold - Supine Active Straight Leg Raise  - 1 x daily - 5-7 x weekly - 2 sets - 10 reps - Supine Heel Slide  - 2 x daily - 7 x weekly - 1-2 sets - 10 reps - Hooklying Clamshell with Resistance  - 1 x daily - 3-4 x weekly - 2 sets - 10 reps  ASSESSMENT:  CLINICAL IMPRESSION: Patient is improving with function as evidenced by subjective reports of walking her dogs.  Pt performed LAQ  without significant pain.  PT did not perform seated HS curls due to increased pain at prior Rx.  Pt performed exercises well with cuing and instruction in correct form.  Pt has weakness in bilat glute medius.  Pt responded well to Rx reporting increased pain to 1/10 after Rx.  She declined ice. Pt should benefit from cont skilled PT services to address impairments and goals and to improve overall function.    OBJECTIVE IMPAIRMENTS: decreased activity tolerance, decreased endurance, decreased mobility, difficulty walking, decreased ROM, decreased strength, and pain.   ACTIVITY LIMITATIONS: bending, sitting, standing, squatting, stairs, transfers, and locomotion level  PARTICIPATION LIMITATIONS: community activity and walking dog  PERSONAL FACTORS: 1-2 comorbidities: back pain and SOB  are also affecting patient's functional outcome.   REHAB POTENTIAL: Good  CLINICAL DECISION MAKING: Stable/uncomplicated  EVALUATION COMPLEXITY: Low   GOALS:   SHORT TERM GOALS: Target date:  02/01/2023  Pt will be independent and compliant with HEP for improved pain, strength, and function.  Baseline: Goal status: INITIAL  2.  Pt will report at least a 25% improvement in pain and sx's overall.  Baseline:  Goal status: INITIAL    LONG TERM GOALS: Target date: 02/22/2023  Pt will demo improved L hip and knee strength to 5/5 except 4+/5 in abd for improved performance of functional mobility with reduced pain.  Baseline:  Goal status: INITIAL  2.  Pt will be able to walk her dogs without significant pain.  Baseline:  Goal status: INITIAL  3.  Pt will report she is able to perform extended community ambulation without significant pain and difficulty.  Baseline:  Goal status: INITIAL    PLAN:  PT FREQUENCY: 1-2x/week  PT DURATION: 6 weeks  PLANNED INTERVENTIONS: Therapeutic exercises, Therapeutic activity, Neuromuscular re-education, Balance training, Gait training, Patient/Family  education, Self Care, Joint mobilization, Stair training, Aquatic Therapy, Dry Needling, Electrical stimulation, Cryotherapy, Moist heat, Taping, Ultrasound, Manual therapy, and Re-evaluation  PLAN FOR NEXT SESSION:  Cont with hip and knee strengthening. Ice as needed to reduce soreness and pain.  Selinda Michaels III PT, DPT 01/25/23 5:20 PM

## 2023-01-26 ENCOUNTER — Encounter (INDEPENDENT_AMBULATORY_CARE_PROVIDER_SITE_OTHER): Payer: Self-pay | Admitting: Family Medicine

## 2023-01-26 ENCOUNTER — Ambulatory Visit (INDEPENDENT_AMBULATORY_CARE_PROVIDER_SITE_OTHER): Payer: 59 | Admitting: Family Medicine

## 2023-01-26 VITALS — BP 108/75 | HR 81 | Temp 98.0°F | Ht 64.0 in | Wt 196.0 lb

## 2023-01-26 DIAGNOSIS — R7303 Prediabetes: Secondary | ICD-10-CM | POA: Diagnosis not present

## 2023-01-26 DIAGNOSIS — F3289 Other specified depressive episodes: Secondary | ICD-10-CM

## 2023-01-26 DIAGNOSIS — E559 Vitamin D deficiency, unspecified: Secondary | ICD-10-CM

## 2023-01-26 DIAGNOSIS — Z6833 Body mass index (BMI) 33.0-33.9, adult: Secondary | ICD-10-CM

## 2023-01-26 MED ORDER — VITAMIN D (ERGOCALCIFEROL) 1.25 MG (50000 UNIT) PO CAPS: 50000.0000 [IU] | ORAL_CAPSULE | ORAL | 0 refills | Status: DC

## 2023-01-26 MED ORDER — METFORMIN HCL ER 500 MG PO TB24: 500.0000 mg | ORAL_TABLET | Freq: Every day | ORAL | 0 refills | Status: DC

## 2023-01-26 MED ORDER — BUPROPION HCL ER (SR) 100 MG PO TB12: 100.0000 mg | ORAL_TABLET | Freq: Two times a day (BID) | ORAL | 0 refills | Status: DC

## 2023-01-26 NOTE — Assessment & Plan Note (Signed)
Improving Net weight loss 12 lb in 6 weeks of medically supervised weight management. Benefiting from CBT with Dr Mallie Mussel, prescribed meal plan and PT.

## 2023-01-26 NOTE — Assessment & Plan Note (Addendum)
Lab Results  Component Value Date   HGBA1C 6.0 (H) 12/09/2022   Doing well on metformin 500 mg once daily with food.  Denies abdominal pain or diarrhea.  Doing well with prescribed meal plan, sugar reduction and weight loss.    For further treatment, plan to increase walking time to 30 min 3 x a week in the next month as knee pain improves.

## 2023-01-26 NOTE — Assessment & Plan Note (Signed)
Started outside counseling and has been focused on improving her mental health.  Support system is good at home.  Practicing mindful eating.  Doing well on Buproprion SR 100 mg bid, refilled.

## 2023-01-26 NOTE — Assessment & Plan Note (Signed)
Last vitamin D Lab Results  Component Value Date   VD25OH 25.8 (L) 12/09/2022   Doing well on RX vitamin D 50,000 IU weekly.  Energy level improving.  Denies abdominal pain, nausea or vomiting.  Recheck level in 2 mos.

## 2023-01-26 NOTE — Progress Notes (Signed)
Office: [phone]  /  Fax: [phone]  WEIGHT SUMMARY AND BIOMETRICS  Vitals Temp: 49 F (36.7 C) BP: 108/75 Pulse Rate: 81 SpO2: 96 %   Anthropometric Measurements Height: '5\' 4"'$  (1.626 m) Weight: 196 lb (88.9 kg) BMI (Calculated): 33.63 Weight at Last Visit: 199lb Weight Lost Since Last Visit: 3lb Starting Weight: 208lb Total Weight Loss (lbs): 12 lb (5.443 kg) Waist Measurement : 44 inches   Body Composition  Body Fat %: 41.2 % Fat Mass (lbs): 80.8 lbs Muscle Mass (lbs): 109.4 lbs Total Body Water (lbs): 75.2 lbs Visceral Fat Rating : 10   Other Clinical Data RMR: 1670 Fasting: no Labs: no Today's Visit #: 4 Starting Date: 12/09/22     HPI  Chief Complaint: OBESITY  Gloria Smith is here to discuss her progress with her obesity treatment plan. She is on the the Category 2 Plan and states she is following her eating plan approximately 99 % of the time. She states she is exercising 20-30 minutes 5 times per week.   Interval History:  Since last office visit she is down 3 lb She is seeing Dr Mallie Mussel for CBT She allows a 'treat night' once a week but keep this in check She is in PT for her knee- has 3 more weeks and plans to work out at [employer] after that. She is getting all the food in on her plan Husband has been supportive Plans to walk the dog more   Pharmacotherapy: Wellbutrin SR 100 mg bid, Metformin 500 mg   PHYSICAL EXAM:  Blood pressure 108/75, pulse 81, temperature 98 F (36.7 C), height '5\' 4"'$  (1.626 m), weight 196 lb (88.9 kg), SpO2 96 %. Body mass index is 33.64 kg/m.  General: She is overweight, cooperative, alert, well developed, and in no acute distress. PSYCH: Has normal mood, affect and thought process.   Lungs: Normal breathing effort, no conversational dyspnea.  DIAGNOSTIC DATA REVIEWED:  BMET    Component Value Date/Time   NA 139 12/30/2022 0818   NA 141 11/24/2017 0900   K 4.7 12/30/2022 0818   K 4.2  11/24/2017 0900   CL 105 12/30/2022 0818   CO2 28 12/30/2022 0818   CO2 27 11/24/2017 0900   GLUCOSE 107 (H) 12/30/2022 0818   GLUCOSE 82 11/24/2017 0900   BUN 19 12/30/2022 0818   BUN 14.7 11/24/2017 0900   CREATININE 0.97 12/30/2022 0818   CREATININE 0.8 11/24/2017 0900   CALCIUM 9.5 12/30/2022 0818   CALCIUM 9.4 11/24/2017 0900   GFRNONAA >60 12/30/2022 0818   GFRAA >60 08/20/2020 0740   Lab Results  Component Value Date   HGBA1C 6.0 (H) 12/09/2022   HGBA1C 5.9 09/04/2019   Lab Results  Component Value Date   INSULIN 35.6 (H) 12/09/2022   Lab Results  Component Value Date   TSH 1.260 12/09/2022   CBC    Component Value Date/Time   WBC 10.7 (H) 12/30/2022 0818   WBC 9.2 12/08/2021 1038   RBC 4.87 12/30/2022 0818   HGB 13.9 12/30/2022 0818   HGB 14.0 11/24/2017 0900   HCT 42.7 12/30/2022 0818   HCT 42.9 11/24/2017 0900   PLT 456 (H) 12/30/2022 0818   PLT 479 (H) 11/24/2017 0900   MCV 87.7 12/30/2022 0818   MCV 86.6 11/24/2017 0900   MCH 28.5 12/30/2022 0818   MCHC 32.6 12/30/2022 0818   RDW 13.3 12/30/2022 0818   RDW 13.7 11/24/2017 0900   Iron Studies    Component  Value Date/Time   IRON 102 06/02/2021 0853   TIBC 314 06/02/2021 0853   FERRITIN 78 06/02/2021 0853   IRONPCTSAT 32 06/02/2021 0853   Lipid Panel     Component Value Date/Time   CHOL 204 (H) 07/20/2022 1102   TRIG 93.0 07/20/2022 1102   HDL 55.20 07/20/2022 1102   CHOLHDL 4 07/20/2022 1102   VLDL 18.6 07/20/2022 1102   LDLCALC 130 (H) 07/20/2022 1102   Hepatic Function Panel     Component Value Date/Time   PROT 7.1 12/30/2022 0818   PROT 7.0 11/24/2017 0900   ALBUMIN 4.2 12/30/2022 0818   ALBUMIN 3.5 11/24/2017 0900   AST 17 12/30/2022 0818   AST 15 11/24/2017 0900   ALT 20 12/30/2022 0818   ALT 31 11/24/2017 0900   ALKPHOS 151 (H) 12/30/2022 0818   ALKPHOS 163 (H) 11/24/2017 0900   BILITOT 0.3 12/30/2022 0818   BILITOT 0.22 11/24/2017 0900      Component Value Date/Time    TSH 1.260 12/09/2022 0946   Nutritional Lab Results  Component Value Date   VD25OH 25.8 (L) 12/09/2022     ASSESSMENT AND PLAN  TREATMENT PLAN FOR OBESITY:  Recommended Dietary Goals  Gloria Smith is currently in the action stage of change. As such, her goal is to continue weight management plan. She has agreed to the Category 2 Plan.  Behavioral Intervention  We discussed the following Behavioral Modification Strategies today: increasing lean protein intake, increasing vegetables, increasing water intake, work on meal planning and easy cooking plans, and work on managing stress, creating time for self-care and relaxation measures.  Additional resources provided today: NA  Recommended Physical Activity Goals  Gloria Smith has been advised to work up to 150 minutes of moderate intensity aerobic activity a week and strengthening exercises 2-3 times per week for cardiovascular health, weight loss maintenance and preservation of muscle mass.   She has agreed to Will begin regular aerobic exercise 30 minutes, 3 times per week. Chosen activity walking.   Pharmacotherapy We discussed various medication options to help [name] with her weight loss efforts and we both agreed to none.  ASSOCIATED CONDITIONS ADDRESSED TODAY  Pre-diabetes Assessment & Plan: Lab Results  Component Value Date   HGBA1C 6.0 (H) 12/09/2022   Doing well on metformin 500 mg once daily with food.  Denies abdominal pain or diarrhea.  Doing well with prescribed meal plan, sugar reduction and weight loss.    For further treatment, plan to increase walking time to 30 min 3 x a week in the next month as knee pain improves.  Orders: -     metFORMIN HCl ER; Take 1 tablet (500 mg total) by mouth daily with breakfast.  Dispense: 30 tablet; Refill: 0  Other depression with emotional eating Assessment & Plan: Started outside counseling and has been focused on improving her mental health.  Support system is good at home.   Practicing mindful eating.  Doing well on Buproprion SR 100 mg bid, refilled.  Orders: -     buPROPion HCl ER (SR); Take 1 tablet (100 mg total) by mouth 2 (two) times daily.  Dispense: 60 tablet; Refill: 0  Vitamin D deficiency Assessment & Plan: Last vitamin D Lab Results  Component Value Date   VD25OH 25.8 (L) 12/09/2022   Doing well on RX vitamin D 50,000 IU weekly.  Energy level improving.  Denies abdominal pain, nausea or vomiting.  Recheck level in 2 mos.  Orders: -     Vitamin  D (Ergocalciferol); Take 1 capsule (50,000 Units total) by mouth every 7 (seven) days.  Dispense: 5 capsule; Refill: 0  Morbid obesity (HCC) Assessment & Plan: Improving Net weight loss 12 lb in 6 weeks of medically supervised weight management. Benefiting from CBT with Dr Mallie Mussel, prescribed meal plan and PT.   BMI 33.0-33.9,adult      No follow-ups on file.Marland Kitchen She was informed of the importance of frequent follow up visits to maximize her success with intensive lifestyle modifications for her multiple health conditions.   ATTESTASTION STATEMENTS:  Reviewed by clinician on day of visit: allergies, medications, problem list, medical history, surgical history, family history, social history, and previous encounter notes pertinent to obesity diagnosis.   I have personally spent 30 minutes total time today in preparation, patient care, nutritional counseling and documentation for this visit, including the following: review of clinical lab tests; review of medical tests/procedures/services.      Dell Ponto, DO

## 2023-01-31 ENCOUNTER — Telehealth (INDEPENDENT_AMBULATORY_CARE_PROVIDER_SITE_OTHER): Payer: 59 | Admitting: Psychology

## 2023-01-31 DIAGNOSIS — F909 Attention-deficit hyperactivity disorder, unspecified type: Secondary | ICD-10-CM

## 2023-01-31 DIAGNOSIS — F5089 Other specified eating disorder: Secondary | ICD-10-CM

## 2023-01-31 DIAGNOSIS — F331 Major depressive disorder, recurrent, moderate: Secondary | ICD-10-CM | POA: Diagnosis not present

## 2023-01-31 NOTE — Progress Notes (Signed)
  Office: [phone]  /  Fax: [phone]    Date: January 31, 2023    Appointment Start Time: 8:04am Duration: 27 minutes Provider: Glennie Isle, Psy.D. Type of Session: Individual Therapy  Location of Patient: Home (private location) Location of Provider: Provider's Home (private office) Type of Contact: Telepsychological Visit via MyChart Video Visit  Session Content: This provider called Gloria Smith at 8:03am as she did not present for today's appointment. A HIPAA compliant voicemail was left requesting a call back. She was observed joining shortly after. As such, today's appointment was initiated 4 minutes late. Gloria Smith is a 52 y.o. female presenting for a follow-up appointment to address the previously established treatment goal of increasing coping skills.Today's appointment was a telepsychological visit. Gloria Smith provided verbal consent for today's telepsychological appointment and she is aware she is responsible for securing confidentiality on her end of the session. Prior to proceeding with today's appointment, Gloria Smith's physical location at the time of this appointment was obtained as well a phone number she could be reached at in the event of technical difficulties. Gloria Smith and this provider participated in today's telepsychological service.   This provider conducted a brief check-in. Gloria Smith reported things have been "great." She noted she continues to "stay on plan" and "lose weight," adding she is also implementing learned skills to reduce emotional eating behaviors. 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 Overall, Gloria Smith was receptive to today's appointment as evidenced by openness to sharing, responsiveness to feedback, and willingness to implement discussed strategies .  Mental Status Examination:  Appearance: neat Behavior: appropriate to  circumstances Mood: neutral Affect: mood congruent Speech: WNL Eye Contact: appropriate Psychomotor Activity: WNL Gait: unable to assess Thought Process: linear, logical, and goal directed and denies suicidal, homicidal, and self-harm ideation, plan and intent  Thought Content/Perception: no hallucinations, delusions, bizarre thinking or behavior endorsed or observed Orientation: AAOx4 Memory/Concentration: memory, attention, language, and fund of knowledge intact  Insight: good Judgment: good  Interventions:  Conducted a brief chart review Provided empathic reflections and validation Provided positive reinforcement Employed supportive psychotherapy interventions to facilitate reduced distress and to improve coping skills with identified stressors Engaged patient in problem solving  DSM-5 Diagnosis(es):  F50.89 Other Specified Feeding or Eating Disorder, Emotional Eating Behaviors, F33.1 Major Depressive Disorder, Recurrent Episode, Moderate, With Anxious Distress, and F90.9 Unspecified Attention-Deficit/Hyperactivity Disorder   Treatment Goal & Progress: During the initial appointment with this provider, the following treatment goal was established: increase coping skills. Gloria Smith has demonstrated progress in her goal as evidenced by increased awareness of hunger patterns, increased awareness of triggers for emotional eating behaviors, and reduction in emotional eating behaviors . Gloria Smith also continues to demonstrate willingness to engage in learned skill(s).  Plan: The next appointment is scheduled for 02/14/2023 at 8:30am, which will be via MyChart Video Visit. The next session will focus on working towards the established treatment goal. Gloria Smith will continue with her primary therapist.

## 2023-02-01 ENCOUNTER — Ambulatory Visit (INDEPENDENT_AMBULATORY_CARE_PROVIDER_SITE_OTHER): Payer: 59 | Admitting: Psychology

## 2023-02-01 ENCOUNTER — Ambulatory Visit (HOSPITAL_BASED_OUTPATIENT_CLINIC_OR_DEPARTMENT_OTHER): Payer: 59 | Admitting: Physical Therapy

## 2023-02-01 DIAGNOSIS — F331 Major depressive disorder, recurrent, moderate: Secondary | ICD-10-CM | POA: Diagnosis not present

## 2023-02-01 NOTE — Progress Notes (Signed)
[facility] Counselor/Therapist Progress Note  Patient ID: Gloria Smith, MRN: [id number],    Date: 02/01/2023  Time Spent: 9:04am-9:49am   Treatment Type: Individual Therapy  pt is seen for a virtual video visit via caregility.  Pt joins from her home, reporting privacy, and counselor from her home office.    Reported Symptoms: feeling more encouraged w/ steps she has taken  Mental Status Exam: Appearance:  Well Groomed     Behavior: Appropriate  Motor: Normal  Speech/Language:  Normal Rate  Affect: Appropriate  Mood: depressed  Thought process: normal  Thought content:   WNL  Sensory/Perceptual disturbances:   WNL  Orientation: oriented to person, place, time/date, and situation  Attention: Good  Concentration: Good  Memory: WNL  Fund of knowledge:  Good  Insight:   Good  Judgment:  Good  Impulse Control: Good   Risk Assessment: Danger to Self:  No Self-injurious Behavior: No Danger to Others: No Duty to Warn:no Physical Aggression / Violence:No  Access to Firearms a concern: No  Gang Involvement:No   Subjective: Counselor assessed pt current functioning per pt report.  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  Discussed pattern of thoughts and negative self talk and how developed.  Explored ways of challenging and reframing.  Pt affect wnl.  Pt reported that feels fatigued- maybe w/ time change.  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  Pt disucssed some interactions that had that were positive. Pt disucssed pattern of negative self talk and being critical and awareness of early interactions in family and w/ peers.  Pt reported that she able to challenge some w/ positive interactions, welcoming and compliments received recent.  Pt disucssed feeling encouraged.  Pt reported struggle w/ how to follow through on things.     Interventions: Cognitive Behavioral Therapy and  supportie  Diagnosis:Major depressive disorder, recurrent episode, moderate (HCC)  Plan: Pt to f/u in 1 week for counseling.  Pt to continue medication management w/ PCP.    Individualized Treatment Plan Strengths: advocating for self, work ethic, focused on self care.  Enjoys trivia team  Supports: husband and close friends   Goal/Needs for Treatment:  In order of importance to patient 1) increased awareness of self, barriers and what to navigate 2) increase coping w/ feelings 3) increase initiating and follow through on tasks   Client Statement of Needs: "just buried her feelings my whole life and now I'm trying to decipher myself and what is wrong.  take care of myself- good at taking care of everyone else so I don't have to take care of myself.  Struggles w/ being hard on self- self critical. Work on [employer] I have learned.  Be able to follow through on things instead of putting off/forgetting."   Treatment Level:outpatient counseling  Symptoms:depressed moods, negative self talk/worth, difficult getting motivated and initiating/following through.  Client Treatment Preferences: counseling weekly to biweekly. Continue medication management w/ her PCP.    Healthcare consumer's goal for treatment:  Counselor, Jan Fireman, [facility] will support the patient's ability to achieve the goals identified. Cognitive Behavioral Therapy, Assertive Communication/Conflict Resolution Training, Relaxation Training, ACT, Humanistic and other evidenced-based practices will be used to promote progress towards healthy functioning.   Healthcare consumer will: Actively participate in therapy, working towards healthy functioning.    *Justification for Continuation/Discontinuation of Goal: R=Revised, O=Ongoing, A=Achieved, D=Discontinued  Goal 1) Pt to increase insight into self- thoughts, behaviors, patterns and contributing factors/impacts  AEB pt identifying and verbalizing in session and therapist  observation.   Baseline date 02/01/23: Progress towards goal 0; How Often - Daily Target Date Goal Was reviewed Status Code Progress towards goal/Likert rating  01/31/24                Goal 2) Pt to increase effective coping w/ depression/anxiety/emotions through verbalizing feelings, related thoughts, reframing distortions and daily self care AEB pt report and therapist observation.  Baseline date 02/01/23: Progress towards goal 0; How Often - Daily Target Date Goal Was reviewed Status Code Progress towards goal  02/01/24                Goal 3) Pt to identify barriers to initiating/completion of tasks and utilize strategies w/ behavioral activation, time management, problem solving skills to improve AEB pt report and counselor observation.   Baseline date 02/01/23: Progress towards goal 0 How Often - Daily Target Date Goal Was reviewed Status Code Progress towards goal  02/01/24                This plan has been reviewed and created by the following participants:  This plan will be reviewed at least every 12 months. Date Behavioral Health Clinician Date Guardian/Patient   3.12.24 [facility]in [facility] 02/01/23 Verbal Consent Provided                    Jan Fireman [facility]

## 2023-02-01 NOTE — Progress Notes (Signed)
? ? ? ? ? ? ? ? ? ? ? ? ? ? ?  [place], LCMHC ?

## 2023-02-07 ENCOUNTER — Ambulatory Visit (INDEPENDENT_AMBULATORY_CARE_PROVIDER_SITE_OTHER): Payer: 59 | Admitting: Psychology

## 2023-02-07 DIAGNOSIS — F331 Major depressive disorder, recurrent, moderate: Secondary | ICD-10-CM | POA: Diagnosis not present

## 2023-02-07 NOTE — Progress Notes (Signed)
[facility] Counselor/Therapist Progress Note  Patient ID: Gloria Smith, MRN: [id number],    Date: 02/07/2023  Time Spent: 9:00am-9:59am   Treatment Type: Individual Therapy  pt is seen for a virtual video visit via caregility.  Pt joins from her home, reporting privacy, and counselor from her home office.    Reported Symptoms: struggling w/ initiating tasks  Mental Status Exam: Appearance:  Well Groomed     Behavior: Appropriate  Motor: Normal  Speech/Language:  Normal Rate  Affect: Appropriate  Mood: depressed  Thought process: normal  Thought content:   WNL  Sensory/Perceptual disturbances:   WNL  Orientation: oriented to person, place, time/date, and situation  Attention: Good  Concentration: Good  Memory: WNL  Fund of knowledge:  Good  Insight:   Good  Judgment:  Good  Impulse Control: Good   Risk Assessment: Danger to Self:  No Self-injurious Behavior: No Danger to Others: No Duty to Warn:no Physical Aggression / Violence:No  Access to Firearms a concern: No  Gang Involvement:No   Subjective: Counselor assessed pt current functioning per pt report.  Processed w/pt mood and initiating tasks activities.  Explored w/ pt her progress and not minimizing.  Discussed pattern of thoughts and how discourages from initiating.  Encouraged focus on what can do or start in small steps.  Explored interactions w/ husband- worries and positives. Discussed effective communication. Pt affect wnl.  Pt reported that she has noticed continued improvement w/ less procrastinating and attributes to medication.  Pt however feels that still a lot to work on and old patterns of procrastination.  Pt discussed list making, trying ot encourage self to start, feeling overwhelmed and then putting off to later. Pt was able to recognize things she did get done and felt good about.  Pt also receptive to what she can start in "bite size pieces".  Pt discussed going out to trivia and  to see a band w/ husband and friend this week.  Pt reported first husband has joined since stopped drinking.  Pt reported was nervous about but that husband was intentional about his choices and stopped at 2 like stated.  Pt expressed how his drinking was impacting things for relationship.   Pt receptive to effective communication.    Interventions: Cognitive Behavioral Therapy, Solution-Oriented/Positive Psychology, and supportie  Diagnosis:Major depressive disorder, recurrent episode, moderate ([place])  Plan: Pt to f/u in 1 week for counseling.  Pt to continue medication management w/ PCP.    Individualized Treatment Plan Strengths: advocating for self, work ethic, focused on self care.  Enjoys trivia team  Supports: husband and close friends   Goal/Needs for Treatment:  In order of importance to patient 1) increased awareness of self, barriers and what to navigate 2) increase coping w/ feelings 3) increase initiating and follow through on tasks   Client Statement of Needs: "just buried her feelings my whole life and now I'm trying to decipher myself and what is wrong.  take care of myself- good at taking care of everyone else so I don't have to take care of myself.  Struggles w/ being hard on self- self critical. Work on [employer] I have learned.  Be able to follow through on things instead of putting off/forgetting."   Treatment Level:outpatient counseling  Symptoms:depressed moods, negative self talk/worth, difficult getting motivated and initiating/following through.  Client Treatment Preferences: counseling weekly to biweekly. Continue medication management w/ her PCP.    Healthcare consumer's goal for treatment:  Counselor, Jan Fireman,  [facility] will support the patient's ability to achieve the goals identified. Cognitive Behavioral Therapy, Assertive Communication/Conflict Resolution Training, Relaxation Training, ACT, Humanistic and other evidenced-based practices will be used  to promote progress towards healthy functioning.   Healthcare consumer will: Actively participate in therapy, working towards healthy functioning.    *Justification for Continuation/Discontinuation of Goal: R=Revised, O=Ongoing, A=Achieved, D=Discontinued  Goal 1) Pt to increase insight into self- thoughts, behaviors, patterns and contributing factors/impacts AEB pt identifying and verbalizing in session and therapist observation.   Baseline date 02/01/23: Progress towards goal 0; How Often - Daily Target Date Goal Was reviewed Status Code Progress towards goal/Likert rating  01/31/24                Goal 2) Pt to increase effective coping w/ depression/anxiety/emotions through verbalizing feelings, related thoughts, reframing distortions and daily self care AEB pt report and therapist observation.  Baseline date 02/01/23: Progress towards goal 0; How Often - Daily Target Date Goal Was reviewed Status Code Progress towards goal  02/01/24                Goal 3) Pt to identify barriers to initiating/completion of tasks and utilize strategies w/ behavioral activation, time management, problem solving skills to improve AEB pt report and counselor observation.   Baseline date 02/01/23: Progress towards goal 0 How Often - Daily Target Date Goal Was reviewed Status Code Progress towards goal  02/01/24                This plan has been reviewed and created by the following participants:  This plan will be reviewed at least every 12 months. Date Behavioral Health Clinician Date Guardian/Patient   3.12.24 [facility] 02/01/23 Verbal Consent Provided                       Jan Fireman [facility]

## 2023-02-08 ENCOUNTER — Encounter (HOSPITAL_BASED_OUTPATIENT_CLINIC_OR_DEPARTMENT_OTHER): Payer: 59

## 2023-02-08 ENCOUNTER — Encounter (INDEPENDENT_AMBULATORY_CARE_PROVIDER_SITE_OTHER): Payer: Self-pay

## 2023-02-08 ENCOUNTER — Ambulatory Visit (INDEPENDENT_AMBULATORY_CARE_PROVIDER_SITE_OTHER): Payer: 59 | Admitting: Family Medicine

## 2023-02-14 ENCOUNTER — Encounter (INDEPENDENT_AMBULATORY_CARE_PROVIDER_SITE_OTHER): Payer: Self-pay

## 2023-02-14 ENCOUNTER — Telehealth (INDEPENDENT_AMBULATORY_CARE_PROVIDER_SITE_OTHER): Payer: 59 | Admitting: Psychology

## 2023-02-14 ENCOUNTER — Telehealth (INDEPENDENT_AMBULATORY_CARE_PROVIDER_SITE_OTHER): Payer: Self-pay | Admitting: Family Medicine

## 2023-02-14 DIAGNOSIS — F5089 Other specified eating disorder: Secondary | ICD-10-CM

## 2023-02-14 DIAGNOSIS — F331 Major depressive disorder, recurrent, moderate: Secondary | ICD-10-CM

## 2023-02-14 DIAGNOSIS — F909 Attention-deficit hyperactivity disorder, unspecified type: Secondary | ICD-10-CM | POA: Diagnosis not present

## 2023-02-14 NOTE — Telephone Encounter (Deleted)
Please disregard message

## 2023-02-14 NOTE — Progress Notes (Signed)
  Office: [phone]  /  Fax: [phone]    Date: February 14, 2023    Appointment Start Time: 8:31am Duration: 28 minutes Provider: Glennie Isle, Psy.D. Type of Session: Individual Therapy  Location of Patient: Home (private location) Location of Provider: Provider's Home (private office) Type of Contact: Telepsychological Visit via MyChart Video Visit  Session Content: Katniss is a 51 y.o. female presenting for a follow-up appointment to address the previously established treatment goal of increasing coping skills.Today's appointment was a telepsychological visit. Rusty provided verbal consent for today's telepsychological appointment and she is aware she is responsible for securing confidentiality on her end of the session. Prior to proceeding with today's appointment, Joyous's physical location at the time of this appointment was obtained as well a phone number she could be reached at in the event of technical difficulties. Vale and this provider participated in today's telepsychological service.   This provider conducted a brief check-in. Timika shared she is "doing pretty good," but acknowledged deviations. Further explored and processed. She discussed deviating during work events. Psychoeducation provided regarding all or nothing thinking. This provider also discussed various strategies to help Tanisia pace herself when eating/drinking at events (e.g., eat with your nondominant hand). Furthermore, Persephanie did not endorse experiencing suicidal, self-injurious, and homicidal ideation, plan, or intent since the last appointment with this provider and described future plans that include ongoing efforts to improve her well-being and eating habits. Overall, Noemi was receptive to today's appointment as evidenced by openness to sharing, responsiveness to feedback, and willingness to implement discussed strategies .  Mental Status Examination:  Appearance: neat Behavior: appropriate to  circumstances Mood: neutral Affect: mood congruent Speech: WNL Eye Contact: appropriate Psychomotor Activity: WNL Gait: unable to assess Thought Process: linear, logical, and goal directed and no evidence or endorsement of suicidal, homicidal, and self-harm ideation, plan and intent  Thought Content/Perception: no hallucinations, delusions, bizarre thinking or behavior endorsed or observed Orientation: AAOx4 Memory/Concentration: memory, attention, language, and fund of knowledge intact  Insight: fair Judgment: fair  Interventions:  Conducted a brief chart review Provided empathic reflections and validation Reviewed content from the previous session Provided positive reinforcement Employed supportive psychotherapy interventions to facilitate reduced distress and to improve coping skills with identified stressors Engaged patient in problem solving  DSM-5 Diagnosis(es):  F50.89 Other Specified Feeding or Eating Disorder, Emotional Eating Behaviors, F33.1 Major Depressive Disorder, Recurrent Episode, Moderate, With Anxious Distress, and F90.9 Unspecified Attention-Deficit/Hyperactivity Disorder   Treatment Goal & Progress: During the initial appointment with this provider, the following treatment goal was established: increase coping skills. Kendrix has demonstrated progress in her goal as evidenced by increased awareness of hunger patterns, increased awareness of triggers for emotional eating behaviors, and reduction in emotional eating behaviors . Amandah also continues to demonstrate willingness to engage in learned skill(s).  Plan: The next appointment is scheduled for 02/28/2023 at 8:30am, which will be via MyChart Video Visit. The next session will focus on working towards the established treatment goal. Analuz will continue with her primary therapist.

## 2023-02-14 NOTE — Telephone Encounter (Deleted)
Patient sent a Mychart message to cancel appointment on 3/19. It was marked as a no show b

## 2023-02-15 ENCOUNTER — Encounter (HOSPITAL_BASED_OUTPATIENT_CLINIC_OR_DEPARTMENT_OTHER): Payer: Self-pay

## 2023-02-15 ENCOUNTER — Ambulatory Visit (HOSPITAL_BASED_OUTPATIENT_CLINIC_OR_DEPARTMENT_OTHER): Payer: 59

## 2023-02-15 DIAGNOSIS — M25562 Pain in left knee: Secondary | ICD-10-CM

## 2023-02-15 DIAGNOSIS — M6281 Muscle weakness (generalized): Secondary | ICD-10-CM

## 2023-02-15 NOTE — Therapy (Signed)
OUTPATIENT PHYSICAL THERAPY TREATMENT NOE   Patient Name: Gloria Smith MRN: [id number] DOB:06-18-1971, 52 y.o., female Today's Date: 02/15/2023  END OF SESSION:  PT End of Session - 02/15/23 0757     Visit Number 4    Number of Visits 8    Date for PT Re-Evaluation 02/22/23    Authorization Type UHC    PT Start Time 0802    PT Stop Time 0843    PT Time Calculation (min) 41 min    Activity Tolerance Patient tolerated treatment well    Behavior During Therapy [facility] for tasks assessed/performed              Past Medical History:  Diagnosis Date   Anxiety    Arthritis    Back pain    Bilateral swelling of feet and ankles    Constipation    Depression    Headache    ocular migraines - very rare   Heartburn    Joint pain    Kidney infection    Malignant neoplasm of upper-outer quadrant of left breast in female, estrogen receptor negative ([place]) 09/10/2016   pt states that cancer is on left breast, not right   Pre-diabetes    SOB (shortness of breath)    Urinary frequency    Vitamin D deficiency    Past Surgical History:  Procedure Laterality Date   BREAST LUMPECTOMY     BREAST LUMPECTOMY WITH RADIOACTIVE SEED AND SENTINEL LYMPH NODE BIOPSY Left 10/05/2016   Procedure: LEFT BREAST RADIOACTIVE SEED GUIDED LUMPECTOMY, LEFT AXILLARY RADIOACTIVE SEED GUIDED NODE EXCISION, LEFT AXILLARY SENTINEL LYMPH NODE BIOPSY(LEFT TARGETED AXILLARY DISSECTION);  Surgeon: Rolm Bookbinder, MD;  Location: [place];  Service: General;  Laterality: Left;   DENTAL SURGERY     DILATION AND CURETTAGE OF UTERUS     LAPAROSCOPIC BILATERAL SALPINGO OOPHERECTOMY Bilateral 04/14/2017   Procedure: LAPAROSCOPIC BILATERAL SALPINGO OOPHORECTOMY;  Surgeon: Aloha Gell, MD;  Location: [facility];  Service: Gynecology;  Laterality: Bilateral;   right knee surgery at age of 57      Patient Active Problem List   Diagnosis Date Noted   Slow transit constipation 01/13/2023   Vitamin D deficiency  12/23/2022   Other insomnia 12/23/2022   Insulin resistance 12/23/2022   Other fatigue 12/09/2022   SOBOE (shortness of breath on exertion) 12/09/2022   Depression 12/09/2022   Chronic pain of left knee 12/09/2022   Depression screen 12/09/2022   Pre-diabetes 11/09/2022   History of left breast cancer 11/09/2022   Hyperlipidemia 06/10/2021   Morbid obesity ([place]) 06/10/2021   Essential (hemorrhagic) thrombocythemia ([place]) 06/10/2021   Snoring 06/04/2020   Genetic testing 10/04/2016   Breast cancer of upper-outer quadrant of left female breast ([place]) 09/10/2016     REFERRING PROVIDER: Persons, Bevely Palmer, [place]   REFERRING DIAG: [phone] (ICD-10-CM) - Left knee pain, unspecified chronicity  THERAPY DIAG:  Left knee pain, unspecified chronicity  Muscle weakness (generalized)  Rationale for Evaluation and Treatment: Rehabilitation  ONSET DATE: MD visit 12/13/2022  SUBJECTIVE:   SUBJECTIVE STATEMENT: Pt denies pain at entry. She reports her knee has not been bothering her recently.    PERTINENT HISTORY: Back pain, depression and anxiety, SOB and has an inhaler, Hx of Breast CA R knee surgery when she was 18  PAIN:  Are you having pain? Yes NPRS:  0/10 current and best, "close to a 10/10" worst  PRECAUTIONS: Other: SOB and has an inhaler  WEIGHT BEARING RESTRICTIONS: No  FALLS:  Has patient fallen in last 6 months? 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   PLOF: Independent.  Pt has had progressively worsening pain which has limited her functional mobility.   PATIENT GOALS: to improve pain, increased walking distance, walk her dogs, increase tolerance for exercise routine    OBJECTIVE:   DIAGNOSTIC FINDINGS: X rays on 12/13/2022:  She does have some early  sclerotic changes especially in the medial compartment. Slight joint space narrowing. Well-maintained alignment no acute osseous injuries noted     TODAY'S TREATMENT:                                                                                                                                Pt performed: -Nustep at L5x 5 mins, just LE's -LAQ 1.5# 2x10 -supine SLR 2 x 10 reps -supine heel slides x 15 reps -S/L hip abd 2 x 10 reps bilat -Standing hip abd x 10 reps bilat -Heel raises 2x10 reps -partial squats at rail 2x10 -step ups 4" step 2x10L -Sidestepping with RTB around thighs x 3 laps at rail -S/L clams 2x10 L 5sec hold -marching on airex 2x10 without UE support  -Pt received L knee flexion and extension PROM in supine. -manual L HSS 30secx3    PATIENT EDUCATION:  Education details: dx,HEP, exercise form and rationale, POC, relevant anatomy, rationale of ice.   Person educated: Patient Education method: Explanation, Demonstration, Tactile cues, Verbal cues, and Handouts Education comprehension: verbalized understanding, returned demonstration, verbal cues required, tactile cues required, and needs further education  HOME EXERCISE PROGRAM: Access Code: [email or web address] URL: [email or web address] Date: 01/12/2023 Prepared by: Ronny Flurry  Exercises - Supine Quadricep Sets  - 2 x daily - 7 x weekly - 2 sets - 10 reps - 5 seconds hold - Supine Active Straight Leg Raise  - 1 x daily - 5-7 x weekly - 2 sets - 10 reps - Supine Heel Slide  - 2 x daily - 7 x weekly - 1-2 sets - 10 reps - Hooklying Clamshell with Resistance  - 1 x daily - 3-4 x weekly - 2 sets - 10 reps  ASSESSMENT:  CLINICAL IMPRESSION: Patient experienced very mild discomfort in L knee during first 3 minutes of nu-step, and then reported resolution of this. Requires verbal cues with supine sLR for full quad engagement. No pain with PROM, however, crepitus is noted with extension from flexed  position. Tenderness present in distal HS, so added manual HSS to address tightness here. Also showed pt seated HSS to perform at home. Trialed progression of CKC with pt citing discomfort initially with step ups, but this resolved after a few repetitions. Will monitor for soreness.    OBJECTIVE  IMPAIRMENTS: decreased activity tolerance, decreased endurance, decreased mobility, difficulty walking, decreased ROM, decreased strength, and pain.   ACTIVITY LIMITATIONS: bending, sitting, standing, squatting, stairs, transfers, and locomotion level  PARTICIPATION LIMITATIONS: community activity and walking dog  PERSONAL FACTORS: 1-2 comorbidities: back pain and SOB  are also affecting patient's functional outcome.   REHAB POTENTIAL: Good  CLINICAL DECISION MAKING: Stable/uncomplicated  EVALUATION COMPLEXITY: Low   GOALS:   SHORT TERM GOALS: Target date:  02/01/2023  Pt will be independent and compliant with HEP for improved pain, strength, and function.  Baseline: Goal status: INITIAL  2.  Pt will report at least a 25% improvement in pain and sx's overall.  Baseline:  Goal status: INITIAL    LONG TERM GOALS: Target date: 02/22/2023  Pt will demo improved L hip and knee strength to 5/5 except 4+/5 in abd for improved performance of functional mobility with reduced pain.  Baseline:  Goal status: INITIAL  2.  Pt will be able to walk her dogs without significant pain.  Baseline:  Goal status: INITIAL  3.  Pt will report she is able to perform extended community ambulation without significant pain and difficulty.  Baseline:  Goal status: INITIAL    PLAN:  PT FREQUENCY: 1-2x/week  PT DURATION: 6 weeks  PLANNED INTERVENTIONS: Therapeutic exercises, Therapeutic activity, Neuromuscular re-education, Balance training, Gait training, Patient/Family education, Self Care, Joint mobilization, Stair training, Aquatic Therapy, Dry Needling, Electrical stimulation, Cryotherapy, Moist  heat, Taping, Ultrasound, Manual therapy, and Re-evaluation  PLAN FOR NEXT SESSION:  Cont with hip and knee strengthening. Ice as needed to reduce soreness and pain.  Sherlynn Carbon, PTA  02/15/23 8:53 AM

## 2023-02-22 ENCOUNTER — Ambulatory Visit (HOSPITAL_BASED_OUTPATIENT_CLINIC_OR_DEPARTMENT_OTHER): Payer: 59 | Attending: Physician Assistant | Admitting: Physical Therapy

## 2023-02-22 ENCOUNTER — Ambulatory Visit: Payer: 59 | Admitting: Psychology

## 2023-02-22 ENCOUNTER — Encounter (HOSPITAL_BASED_OUTPATIENT_CLINIC_OR_DEPARTMENT_OTHER): Payer: Self-pay | Admitting: Physical Therapy

## 2023-02-22 DIAGNOSIS — M6281 Muscle weakness (generalized): Secondary | ICD-10-CM | POA: Diagnosis present

## 2023-02-22 DIAGNOSIS — M25562 Pain in left knee: Secondary | ICD-10-CM | POA: Diagnosis not present

## 2023-02-22 NOTE — Therapy (Signed)
OUTPATIENT PHYSICAL THERAPY TREATMENT NOE   Patient Name: Gloria Smith MRN: [id number] DOB:[birth year], 52 y.o., female Today's Date: 02/23/2023  END OF SESSION:  PT End of Session - 02/22/23 0809     Visit Number 5    Number of Visits 5    Date for PT Re-Evaluation 02/22/23    Authorization Type UHC    PT Start Time 0805    PT Stop Time 0854    PT Time Calculation (min) 49 min    Activity Tolerance Patient tolerated treatment well    Behavior During Therapy [facility] for tasks assessed/performed              Past Medical History:  Diagnosis Date   Anxiety    Arthritis    Back pain    Bilateral swelling of feet and ankles    Constipation    Depression    Headache    ocular migraines - very rare   Heartburn    Joint pain    Kidney infection    Malignant neoplasm of upper-outer quadrant of left breast in female, estrogen receptor negative 09/10/2016   pt states that cancer is on left breast, not right   Pre-diabetes    SOB (shortness of breath)    Urinary frequency    Vitamin D deficiency    Past Surgical History:  Procedure Laterality Date   BREAST LUMPECTOMY     BREAST LUMPECTOMY WITH RADIOACTIVE SEED AND SENTINEL LYMPH NODE BIOPSY Left 10/05/2016   Procedure: LEFT BREAST RADIOACTIVE SEED GUIDED LUMPECTOMY, LEFT AXILLARY RADIOACTIVE SEED GUIDED NODE EXCISION, LEFT AXILLARY SENTINEL LYMPH NODE BIOPSY(LEFT TARGETED AXILLARY DISSECTION);  Surgeon: Rolm Bookbinder, MD;  Location: [place];  Service: General;  Laterality: Left;   DENTAL SURGERY     DILATION AND CURETTAGE OF UTERUS     LAPAROSCOPIC BILATERAL SALPINGO OOPHERECTOMY Bilateral 04/14/2017   Procedure: LAPAROSCOPIC BILATERAL SALPINGO OOPHORECTOMY;  Surgeon: Aloha Gell, MD;  Location: [facility];  Service: Gynecology;  Laterality: Bilateral;   right knee surgery at age of 4      Patient Active Problem List   Diagnosis Date Noted   Slow transit constipation 01/13/2023   Vitamin D deficiency 12/23/2022    Other insomnia 12/23/2022   Insulin resistance 12/23/2022   Other fatigue 12/09/2022   SOBOE (shortness of breath on exertion) 12/09/2022   Depression 12/09/2022   Chronic pain of left knee 12/09/2022   Depression screen 12/09/2022   Pre-diabetes 11/09/2022   History of left breast cancer 11/09/2022   Hyperlipidemia 06/10/2021   Morbid obesity 06/10/2021   Essential (hemorrhagic) thrombocythemia 06/10/2021   Snoring 06/04/2020   Genetic testing 10/04/2016   Breast cancer of upper-outer quadrant of left female breast 09/10/2016     REFERRING PROVIDER: Persons, Bevely Palmer, [place]   REFERRING DIAG: [phone] (ICD-10-CM) - Left knee pain, unspecified chronicity  THERAPY DIAG:  Left knee pain, unspecified chronicity  Muscle weakness (generalized)  Rationale for Evaluation and Treatment: Rehabilitation  ONSET DATE: MD visit 12/13/2022  SUBJECTIVE:   SUBJECTIVE STATEMENT: Pt denies pain currently.  Pt reports compliance with HEP.  Pt states she felt great after prior Rx.    FUNCTIONAL IMPROVEMENTS:  Pt states she can walk a lot further.  Pt can ambulate 1.5 miles before having pain.  Pt reports 100% in pain and sx's overall.  Pt able to stand up after sitting awhile without pain.  FUNCTIONAL LIMITATIONS:   Pt hasn't tried a long flight of stairs recently.    PERTINENT  HISTORY: Back pain, depression and anxiety, SOB and has an inhaler, Hx of Breast CA R knee surgery when she was 18  PAIN:  Are you having pain? Yes NPRS:  0/10 current and best, 1-2/10 worst  PRECAUTIONS: Other: SOB and has an inhaler  WEIGHT BEARING RESTRICTIONS: No  FALLS:  Has patient fallen in last 6 months? 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   PLOF: Independent.  Pt has had progressively  worsening pain which has limited her functional mobility.   PATIENT GOALS: to improve pain, increased walking distance, walk her dogs, increase tolerance for exercise routine    OBJECTIVE:   DIAGNOSTIC FINDINGS: X rays on 12/13/2022:  She does have some early sclerotic changes especially in the medial compartment. Slight joint space narrowing. Well-maintained alignment no acute osseous injuries noted     TODAY'S TREATMENT:                                                                                                                                 Knee AROM:  132 deg in bilat  LOWER EXTREMITY MMT:   MMT Right eval Left eval Right 4/2 Left 4/2  Hip flexion 5/5 3+/5  5/5  Hip extension        Hip abduction 3+/5 3+/5 5/5 4/5  Hip adduction        Hip internal rotation        Hip external rotation        Knee flexion 5/5, seated 4-/5, seated   4+/5, seated   Knee extension 5/5 Unable to tolerate resistance  5/5  Ankle dorsiflexion        Ankle plantarflexion        Ankle inversion        Ankle eversion         (Blank rows = not tested)   -FOTO:  INITIAL/CURRENT:  50 / 85.  Goal of 64  Pt performed: -Nustep at L5x 5 mins, just LE's -LAQ 2# x 10 reps and 3# 2x10 -S/L hip abd 2 x 10 reps L LE -Standing hip abd x 10 reps bilat -Heel raises 2x10 reps -partial squats at rail x10 -Sidestepping with RTB around thighs x 3 laps at rail -S/L clams 2x10   PT reviewed HEP and updated HEP.  Pt received a HEP handout and was educated in correct form and appropriate frequency.  Pt instructed she should not have pain with HEP.     See below for pt education    PATIENT EDUCATION:  Education details:  Pt had questions concerning gym exercises and PT answered questions.  PT took pt in gym and showed her the leg press and instructed her  in aquatic exercises.  PT educated pt in correct squatting form.  Objective findings and progress.  Goal progress.  POC/discharge planning.  HEP.    Person educated: Patient Education method: Explanation, Demonstration, Tactile cues, Verbal cues, and Handouts Education comprehension: verbalized understanding, returned demonstration, verbal cues required, tactile cues required, and needs further education  HOME EXERCISE PROGRAM: Access Code: [email or web address] URL: [email or web address] Date: 01/12/2023 Prepared by: Ronny Flurry  Exercises - Supine Quadricep Sets  - 2 x daily - 7 x weekly - 2 sets - 10 reps - 5 seconds hold - Supine Active Straight Leg Raise  - 1 x daily - 5-7 x weekly - 2 sets - 10 reps - Supine Heel Slide  - 2 x daily - 7 x weekly - 1-2 sets - 10 reps - Hooklying Clamshell with Resistance  - 1 x daily - 3-4 x weekly - 2 sets - 10 reps  ASSESSMENT:  CLINICAL IMPRESSION: Patient has made excellent progress in PT.  She denies pain currently and her worst pain is a 1-2/10.  Pt reports 100% in pain and sx's overall.  Pt has improved pain with ambulation and standing up after sitting awhile.  Pt demonstrates a good improvement in knee flexion ROM bilat.  She demonstrates improved strength t/o L LE with still some weakness in hip abd and mild weakness in HS though improved overall.  Pt has met all goals except partially meeting strength goal and LTG #2 being discontinued.  PT reviewed HEP and updated HEP.  PT spent time establishing pt's long term HEP.  Pt demonstrates good understanding and is independent with advanced HEP.  Pt demonstrates improved self perceived disability with FOTO improving from 50 to 84.  Pt met FOTO goal.  Pt is ready for discharge.        OBJECTIVE IMPAIRMENTS: decreased activity tolerance, decreased endurance, decreased mobility, difficulty walking, decreased ROM, decreased strength, and pain.   ACTIVITY LIMITATIONS: bending, sitting, standing, squatting, stairs, transfers, and locomotion level  PARTICIPATION LIMITATIONS: community activity and walking dog  PERSONAL FACTORS: 1-2  comorbidities: back pain and SOB  are also affecting patient's functional outcome.   REHAB POTENTIAL: Good  CLINICAL DECISION MAKING: Stable/uncomplicated  EVALUATION COMPLEXITY: Low   GOALS:   SHORT TERM GOALS: Target date:  02/01/2023  Pt will be independent and compliant with HEP for improved pain, strength, and function.  Baseline: Goal status: GOAL MET  2.  Pt will report at least a 25% improvement in pain and sx's overall.  Baseline:  Goal status: GOAL MET    LONG TERM GOALS: Target date: 02/22/2023  Pt will demo improved L hip and knee strength to 5/5 except 4+/5 in abd for improved performance of functional mobility with reduced pain.  Baseline:  Goal status: PARTIALLY MET  2.  Pt will be able to walk her dogs without significant pain.  Baseline:  Goal status: Discontinue due to her dogs passing away.   3.  Pt will report she is able to perform extended community ambulation without significant pain and difficulty.  Baseline: GOAL MET    PLAN:   PLANNED INTERVENTIONS: Therapeutic exercises, Therapeutic activity, Neuromuscular re-education, Balance training, Gait training, Patient/Family education, Self Care, Joint mobilization, Stair training, Aquatic Therapy, Dry Needling, Electrical stimulation, Cryotherapy, Moist heat, Taping, Ultrasound, Manual therapy, and Re-evaluation  PLAN FOR NEXT SESSION:  Pt to be discharged from skilled PT services due to meeting the majority of goals.  Pt is agreeable with discharge.  She is independent with  HEP and will cont with HEP.   PHYSICAL THERAPY DISCHARGE SUMMARY  Visits from Start of Care: 5  Current functional level related to goals / functional outcomes: See above   Remaining deficits: See above   Education / Equipment: See above     Selinda Michaels III PT, DPT 02/23/23 8:03 AM

## 2023-02-24 NOTE — Telephone Encounter (Signed)
4/4 error

## 2023-02-28 ENCOUNTER — Telehealth (INDEPENDENT_AMBULATORY_CARE_PROVIDER_SITE_OTHER): Payer: 59 | Admitting: Psychology

## 2023-02-28 DIAGNOSIS — F331 Major depressive disorder, recurrent, moderate: Secondary | ICD-10-CM

## 2023-02-28 DIAGNOSIS — F5089 Other specified eating disorder: Secondary | ICD-10-CM | POA: Diagnosis not present

## 2023-02-28 DIAGNOSIS — F909 Attention-deficit hyperactivity disorder, unspecified type: Secondary | ICD-10-CM | POA: Diagnosis not present

## 2023-02-28 NOTE — Progress Notes (Signed)
  Office: [phone]  /  Fax: [phone]    Date: February 28, 2023    Appointment Start Time: 8:30am Duration: 33 minutes Provider: Lawerance Cruel, Psy.D. Type of Session: Individual Therapy  Location of Patient: Home (private location) Location of Provider: Provider's Home (private office) Type of Contact: Telepsychological Visit via MyChart Video Visit  Session Content: Gloria Smith is a 53 y.o. female presenting for a follow-up appointment to address the previously established treatment goal of increasing coping skills.Today's appointment was a telepsychological visit. [name] provided verbal consent for today's telepsychological appointment and she is aware she is responsible for securing confidentiality on her end of the session. Prior to proceeding with today's appointment, [name]'s physical location at the time of this appointment was obtained as well a phone number she could be reached at in the event of technical difficulties. [place] and this provider participated in today's telepsychological service.   This provider conducted a brief check-in. [name] shared things have been "good and bad." Further explored and processed. She explained her eating habits have improved overall, but she lost both her dogs within 48 hours. Leon acknowledged since her dogs' passing, her birthday, and Easter, she deviated from her goals. However, she indicated she is back on track and described making better choices/engaging in portion control while deviating. Psychoeducation provided regarding self-compassion. Darlean was engaged in a self-compassion exercise to help with eating-related challenges and other ongoing stressors. She was encouraged to regularly ask herself, "What do I need right now?" and "How can I comfort and care for myself in this moment?" Overall, [name] was receptive to today's appointment as evidenced by openness to sharing, responsiveness to feedback, and willingness to work toward increasing  self-compassion.  Mental Status Examination:  Appearance: neat Behavior: appropriate to circumstances Mood: sad Affect: mood congruent Speech: WNL Eye Contact: appropriate Psychomotor Activity: WNL Gait: unable to assess Thought Process: linear, logical, and goal directed and denies suicidal, homicidal, and self-harm ideation, plan and intent  Thought Content/Perception: no hallucinations, delusions, bizarre thinking or behavior endorsed or observed Orientation: AAOx4 Memory/Concentration: memory, attention, language, and fund of knowledge intact  Insight: good Judgment: good  Interventions:  Conducted a brief chart review Provided empathic reflections and validation Provided positive reinforcement Employed supportive psychotherapy interventions to facilitate reduced distress and to improve coping skills with identified stressors Psychoeducation provided regarding self-compassion Engaged pt in a self-compassion exercise  DSM-5 Diagnosis(es):  F50.89 Other Specified Feeding or Eating Disorder, Emotional Eating Behaviors, F33.1 Major Depressive Disorder, Recurrent Episode, Moderate, With Anxious Distress, and F90.9 Unspecified Attention-Deficit/Hyperactivity Disorder   Treatment Goal & Progress: During the initial appointment with this provider, the following treatment goal was established: increase coping skills. [name] has demonstrated progress in her goal as evidenced by increased awareness of hunger patterns, increased awareness of triggers for emotional eating behaviors, and reduction in emotional eating behaviors . [name] also continues to demonstrate willingness to engage in learned skill(s).  Plan: The next appointment is scheduled for 03/21/2023 at 8:30am, which will be via MyChart Video Visit. The next session will focus on working towards the established treatment goal and termination planning. [name] will continue with her primary therapist.

## 2023-03-03 ENCOUNTER — Encounter (INDEPENDENT_AMBULATORY_CARE_PROVIDER_SITE_OTHER): Payer: Self-pay | Admitting: Family Medicine

## 2023-03-03 ENCOUNTER — Ambulatory Visit (INDEPENDENT_AMBULATORY_CARE_PROVIDER_SITE_OTHER): Payer: 59 | Admitting: Family Medicine

## 2023-03-03 VITALS — BP 95/68 | HR 80 | Temp 98.4°F | Ht 64.0 in | Wt 189.0 lb

## 2023-03-03 DIAGNOSIS — F3289 Other specified depressive episodes: Secondary | ICD-10-CM | POA: Diagnosis not present

## 2023-03-03 DIAGNOSIS — E559 Vitamin D deficiency, unspecified: Secondary | ICD-10-CM

## 2023-03-03 DIAGNOSIS — Z6832 Body mass index (BMI) 32.0-32.9, adult: Secondary | ICD-10-CM

## 2023-03-03 DIAGNOSIS — R7303 Prediabetes: Secondary | ICD-10-CM | POA: Diagnosis not present

## 2023-03-03 MED ORDER — BUPROPION HCL ER (XL) 150 MG PO TB24: 150.0000 mg | ORAL_TABLET | Freq: Every day | ORAL | 0 refills | Status: DC

## 2023-03-03 MED ORDER — METFORMIN HCL ER 500 MG PO TB24: 500.0000 mg | ORAL_TABLET | Freq: Every day | ORAL | 0 refills | Status: DC

## 2023-03-03 MED ORDER — VITAMIN D (ERGOCALCIFEROL) 1.25 MG (50000 UNIT) PO CAPS: 50000.0000 [IU] | ORAL_CAPSULE | ORAL | 0 refills | Status: DC

## 2023-03-03 NOTE — Assessment & Plan Note (Signed)
Reviewed patient's bioimpedance results.  She does continue to lose skeletal muscle mass.  She has been less consistent with reaching her daily protein targets and has not been consistent with exercise.  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  Encouraged her to track her daily steps using her smart watch.  She plans to begin resistance training at the gym 2 to 3 days a week.

## 2023-03-03 NOTE — Progress Notes (Signed)
Office: [phone]  /  Fax: [phone]  WEIGHT SUMMARY AND BIOMETRICS  Starting Date: 12/09/22  Starting Weight: 208lb   Weight Lost Since Last Visit: 7lb   Vitals Temp: 98.4 F (36.9 C) BP: 95/68 Pulse Rate: 80 SpO2: 96 %   Body Composition  Body Fat %: 40.2 % Fat Mass (lbs): 76 lbs Muscle Mass (lbs): 107.4 lbs Total Body Water (lbs): 73.8 lbs Visceral Fat Rating : 10     HPI  Chief Complaint: OBESITY  Gloria Smith is here to discuss her progress with her obesity treatment plan. She is on the the Category 2 Plan and states she is following her eating plan approximately 90 % of the time. She states she is exercising 0 minutes 0 times per week.  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 Body mass index is 32.44 kg/m.  General: She is overweight, cooperative, alert, well developed, and in no acute distress. PSYCH: Has normal mood, affect and thought process.   Lungs: Normal breathing effort, no conversational dyspnea.   ASSESSMENT AND PLAN  TREATMENT PLAN FOR OBESITY:  Recommended Dietary Goals  Gloria Smith is currently in the action stage of change. As such, her goal is to continue weight management plan. She has agreed to the Category 2 Plan.  Behavioral Intervention  We discussed the following Behavioral Modification Strategies today: increasing lean protein intake, increasing vegetables, increasing lower glycemic  fruits, increasing fiber rich foods, avoiding skipping meals, increasing water intake, work on meal planning and preparation, reading food labels , and keeping healthy foods at home.  Additional resources provided today: NA  Recommended Physical Activity Goals  Gloria Smith has been advised to work up to 150 minutes of moderate intensity aerobic activity a week and strengthening exercises 2-3 times per week for cardiovascular health, weight loss maintenance and preservation of muscle mass.   She has agreed to [employer] strengthening exercises with a goal of 2-3 sessions a week  and track daily steps  Pharmacotherapy changes for the treatment of obesity:   ASSOCIATED CONDITIONS ADDRESSED TODAY  Pre-diabetes Assessment & Plan: Lab Results  Component Value Date   HGBA1C 6.0 (H) 12/09/2022   Doing well on metformin 500 mg once daily with food.  Denies GI side effects.  She has actively been working on reducing her intake of added sugar and refined carbohydrates.  She has seen 19 pounds of weight loss in the past 3 months.  Plan: Continue metformin 500 mg once daily with food.  Recheck CMP, B12 and A1c next visit. Increase walking time, tracking daily steps with a target goal of 10,000 steps daily.  Orders: -     metFORMIN HCl ER; Take 1 tablet (500 mg total) by mouth daily with breakfast.  Dispense: 30 tablet; Refill: 0  Other depression with emotional eating Assessment & Plan: Emotional eating has worsened since her last visit due  to the passing of her 2 dogs.  She has a good support system at home and has been working more on meal planning.  She has seen improvements on Wellbutrin SR 100 mg twice daily but is often forgetting to take her evening dose.  Plan: Continue working on stress reduction.  Schedule follow-up with Dr. Dewaine Conger for CBT as needed.  Will change her Wellbutrin to Wellbutrin XL 150 mg once daily in the morning for ease of dosing.  Orders: -     buPROPion HCl ER (XL); Take 1  tablet (150 mg total) by mouth daily.  Dispense: 30 tablet; Refill: 0  Vitamin D deficiency Assessment & Plan: Last vitamin D Lab Results  Component Value Date   VD25OH 25.8 (L) 12/09/2022   Doing well on prescription vitamin D 50,000 IU once weekly.  Energy level is starting to improve.  She denies adverse side effects.  Plan: Continue vitamin D 50,000 IU once weekly.  Recheck vitamin D level next visit.  Orders: -     Vitamin D (Ergocalciferol); Take 1 capsule (50,000 Units total) by mouth every 7 (seven) days.  Dispense: 5 capsule; Refill: 0  Morbid obesity Assessment & Plan: Reviewed patient's bioimpedance results.  She does continue to lose skeletal muscle mass.  She has been less consistent with reaching her daily protein targets and has not been consistent with exercise.  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  Encouraged her to track her daily steps using her smart watch.  She plans to begin resistance training at the gym 2 to 3 days a week.   BMI 32.0-32.9,adult      She was informed of the importance of frequent follow up visits to maximize her success with intensive lifestyle modifications for her multiple health conditions.   ATTESTASTION STATEMENTS:  Reviewed by clinician on day of visit: allergies, medications, problem list, medical history, surgical history, family history, social history, and previous encounter notes pertinent to obesity diagnosis.   I have personally spent 30 minutes total time today in preparation, patient care, nutritional counseling and documentation for this visit, including the following: review of clinical lab tests; review of medical tests/procedures/services.      [name], DO DABFM, DABOM [facility] [address] [place], [place] [address] [phone]

## 2023-03-03 NOTE — Assessment & Plan Note (Signed)
Emotional eating has worsened since her last visit due to the passing of her 2 dogs.  She has a good support system at home and has been working more on meal planning.  She has seen improvements on Wellbutrin SR 100 mg twice daily but is often forgetting to take her evening dose.  Plan: Continue working on stress reduction.  Schedule follow-up with Dr. Dewaine Conger for CBT as needed.  Will change her Wellbutrin to Wellbutrin XL 150 mg once daily in the morning for ease of dosing.

## 2023-03-03 NOTE — Assessment & Plan Note (Addendum)
Lab Results  Component Value Date   HGBA1C 6.0 (H) 12/09/2022   Doing well on metformin 500 mg once daily with food.  Denies GI side effects.  She has actively been working on reducing her intake of added sugar and refined carbohydrates.  She has seen 19 pounds of weight loss in the past 3 months.  Plan: Continue metformin 500 mg once daily with food.  Recheck CMP, B12 and A1c next visit. Increase walking time, tracking daily steps with a target goal of 10,000 steps daily.

## 2023-03-03 NOTE — Assessment & Plan Note (Signed)
Last vitamin D Lab Results  Component Value Date   VD25OH 25.8 (L) 12/09/2022   Doing well on prescription vitamin D 50,000 IU once weekly.  Energy level is starting to improve.  She denies adverse side effects.  Plan: Continue vitamin D 50,000 IU once weekly.  Recheck vitamin D level next visit.

## 2023-03-20 ENCOUNTER — Other Ambulatory Visit (INDEPENDENT_AMBULATORY_CARE_PROVIDER_SITE_OTHER): Payer: Self-pay | Admitting: Family Medicine

## 2023-03-20 DIAGNOSIS — F3289 Other specified depressive episodes: Secondary | ICD-10-CM

## 2023-03-21 ENCOUNTER — Telehealth (INDEPENDENT_AMBULATORY_CARE_PROVIDER_SITE_OTHER): Payer: 59 | Admitting: Psychology

## 2023-03-21 DIAGNOSIS — F909 Attention-deficit hyperactivity disorder, unspecified type: Secondary | ICD-10-CM | POA: Diagnosis not present

## 2023-03-21 DIAGNOSIS — F5089 Other specified eating disorder: Secondary | ICD-10-CM

## 2023-03-21 DIAGNOSIS — F331 Major depressive disorder, recurrent, moderate: Secondary | ICD-10-CM

## 2023-03-21 NOTE — Progress Notes (Signed)
  Office: [phone]  /  Fax: [phone]    Date: March 21, 2023    Appointment Start Time: 8:31am Duration: 25 minutes Provider: Lawerance Cruel, Psy.D. Type of Session: Individual Therapy  Location of Patient: Home (private location) Location of Provider: Provider's Home (private office) Type of Contact: Telepsychological Visit via MyChart Video Visit  Session Content: Gloria Smith is a 52 y.o. female presenting for a follow-up appointment to address the previously established treatment goal of increasing coping skills.Today's appointment was a telepsychological visit. Gloria Smith provided verbal consent for today's telepsychological appointment and she is aware she is responsible for securing confidentiality on her end of the session. Prior to proceeding with today's appointment, Gloria Smith's physical location at the time of this appointment was obtained as well a phone number she could be reached at in the event of technical difficulties. Gloria Smith and this provider participated in today's telepsychological service.   This provider conducted a brief check-in. Gloria Smith shared she is doing "well." She described occasional deviations, noting, "I didn't beat myself up over it." Notably, she identified work and family health-related stressors. Further explored and processed. Psychoeducation regarding the importance of self-care utilizing the oxygen mask metaphor was provided. Psychoeducation regarding pleasurable activities, including its impact on emotional eating and overall well-being was also provided. Gloria Smith was provided with a handout with various options of pleasurable activities, and was encouraged to engage in one activity a day and additional activities as needed when triggered to emotionally eat. Gloria Smith agreed. Gloria Smith provided verbal consent during today's appointment for this provider to send a handout with pleasurable activities via e-mail. Overall, Gloria Smith was receptive to today's appointment as evidenced by  openness to sharing, responsiveness to feedback, and willingness to engage in pleasurable activities to assist with coping.  Mental Status Examination:  Appearance: neat Behavior: appropriate to circumstances Mood: neutral Affect: mood congruent Speech: WNL Eye Contact: appropriate Psychomotor Activity: WNL Gait: unable to assess Thought Process: linear, logical, and goal directed and no evidence or endorsement of suicidal, homicidal, and self-harm ideation, plan and intent  Thought Content/Perception: no hallucinations, delusions, bizarre thinking or behavior endorsed or observed Orientation: AAOx4 Memory/Concentration: memory, attention, language, and fund of knowledge intact  Insight: good Judgment: good  Interventions:  Conducted a brief chart review Provided empathic reflections and validation Provided positive reinforcement Employed supportive psychotherapy interventions to facilitate reduced distress and to improve coping skills with identified stressors Psychoeducation provided regarding pleasurable activities Psychoeducation provided regarding self-care  DSM-5 Diagnosis(es):  F50.89 Other Specified Feeding or Eating Disorder, Emotional Eating Behaviors, F33.1 Major Depressive Disorder, Recurrent Episode, Moderate, With Anxious Distress, and F90.9 Unspecified Attention-Deficit/Hyperactivity Disorder   Treatment Goal & Progress: During the initial appointment with this provider, the following treatment goal was established: increase coping skills. Gloria Smith has demonstrated progress in her goal as evidenced by increased awareness of hunger patterns, increased awareness of triggers for emotional eating behaviors, and reduction in emotional eating behaviors . Gloria Smith also continues to demonstrate willingness to engage in learned skill(s).  Plan: The next appointment is scheduled for 04/04/2023 at 2:30pm, which will be via MyChart Video Visit. The next session will focus on working towards  the established treatment goal. Gloria Smith will continue with her primary therapist.

## 2023-03-27 ENCOUNTER — Other Ambulatory Visit (INDEPENDENT_AMBULATORY_CARE_PROVIDER_SITE_OTHER): Payer: Self-pay | Admitting: Family Medicine

## 2023-03-27 DIAGNOSIS — F3289 Other specified depressive episodes: Secondary | ICD-10-CM

## 2023-03-31 ENCOUNTER — Ambulatory Visit (INDEPENDENT_AMBULATORY_CARE_PROVIDER_SITE_OTHER): Payer: 59 | Admitting: Family Medicine

## 2023-03-31 ENCOUNTER — Encounter (INDEPENDENT_AMBULATORY_CARE_PROVIDER_SITE_OTHER): Payer: Self-pay | Admitting: Family Medicine

## 2023-03-31 VITALS — BP 110/70 | HR 90 | Temp 98.1°F | Ht 64.0 in | Wt 187.0 lb

## 2023-03-31 DIAGNOSIS — F3289 Other specified depressive episodes: Secondary | ICD-10-CM

## 2023-03-31 DIAGNOSIS — Z6832 Body mass index (BMI) 32.0-32.9, adult: Secondary | ICD-10-CM

## 2023-03-31 DIAGNOSIS — E559 Vitamin D deficiency, unspecified: Secondary | ICD-10-CM

## 2023-03-31 DIAGNOSIS — R7303 Prediabetes: Secondary | ICD-10-CM

## 2023-03-31 MED ORDER — METFORMIN HCL ER 500 MG PO TB24: 500.0000 mg | ORAL_TABLET | Freq: Every day | ORAL | 0 refills | Status: DC

## 2023-03-31 MED ORDER — VITAMIN D (ERGOCALCIFEROL) 1.25 MG (50000 UNIT) PO CAPS: 50000.0000 [IU] | ORAL_CAPSULE | ORAL | 0 refills | Status: DC

## 2023-03-31 MED ORDER — BUPROPION HCL ER (XL) 150 MG PO TB24: 150.0000 mg | ORAL_TABLET | Freq: Every day | ORAL | 0 refills | Status: DC

## 2023-03-31 NOTE — Assessment & Plan Note (Addendum)
Lab Results  Component Value Date   HGBA1C 6.0 (H) 12/09/2022   She is doing well on metformin 500 mg once daily with food.  She denies adverse side effects.  She is working on weight reduction, a low sugar/low-carb diet and regular exercise.  Recheck A1c, fasting insulin, B12 and chemistry panel today. Continue prescribed dietary plan

## 2023-03-31 NOTE — Progress Notes (Signed)
Office: [phone]  /  Fax: [phone]  WEIGHT SUMMARY AND BIOMETRICS  Starting Date: 12/09/22  Starting Weight: 208lb   Weight Lost Since Last Visit: 2lb   Vitals Temp: 98.1 F (36.7 C) BP: 110/70 Pulse Rate: 90 SpO2: 96 %   Body Composition  Body Fat %: 39.2 % Fat Mass (lbs): 73.4 lbs Muscle Mass (lbs): 107.8 lbs Total Body Water (lbs): 74.2 lbs Visceral Fat Rating : 10     HPI  Chief Complaint: OBESITY  Gloria Smith is here to discuss her progress with her obesity treatment plan. She is on the the Category 2 Plan and states she is following her eating plan approximately 95 % of the time. She states she is exercising 20-30 minutes 7 times per week.   Interval History:  Since last office visit she is down 2 lb She is having greek yogurt + fruit for breakfast, a protein shake and fruit for lunch and a FACTOR meal for dinner.  She has a Nature conservation officer after dinner, a sweet snack in the afternoon Appetite is well controlled She is lacking veggies She is up to 6,000 steps / day on her smartwatch She is going to [place] next week for her niece's wedding.  She will be there for a week   Pharmacotherapy: Wellbutrin XL 150 mg daily and metformin 500 mg daily  PHYSICAL EXAM:  Blood pressure 110/70, pulse 90, temperature 98.1 F (36.7 C), height 5\' 4"  (1.626 m), weight 187 lb (84.8 kg), SpO2 96 %. Body mass index is 32.1 kg/m.  General: She is overweight, cooperative, alert, well developed, and in no acute distress. PSYCH: Has normal mood, affect and thought process.   Lungs: Normal breathing effort, no conversational dyspnea.   ASSESSMENT AND PLAN  TREATMENT PLAN FOR OBESITY:  Recommended Dietary Goals  [name] is currently in the action stage of change. As such, her goal is to continue weight management plan. She has agreed to the Category 2 Plan.  Behavioral Intervention  We discussed the following Behavioral Modification Strategies today: increasing lean  protein intake, decreasing simple carbohydrates , increasing vegetables, increasing lower glycemic fruits, increasing fiber rich foods, increasing water intake, continue to work on implementation of reduced calorie nutritional plan, continue to practice mindfulness when eating, and planning for success. -We discussed adding in more raw cut up vegetables as a snack in place of sweet snacks  Additional resources provided today: NA  Recommended Physical Activity Goals  [name] has been advised to work up to 150 minutes of moderate intensity aerobic activity a week and strengthening exercises 2-3 times per week for cardiovascular health, weight loss maintenance and preservation of muscle mass.   She has agreed to Increase the intensity, frequency or duration of aerobic exercises   Track daily steps with a target goal of over 7000/day  Pharmacotherapy changes for the treatment of obesity: None  ASSOCIATED CONDITIONS ADDRESSED TODAY  Pre-diabetes Assessment & Plan: Lab Results  Component Value Date   HGBA1C 6.0 (H) 12/09/2022   She is doing well on metformin 500 mg once daily with food.  She denies adverse side effects.  She is working on weight reduction, a low sugar/low-carb diet and regular exercise.  Recheck A1c, fasting insulin, B12 and chemistry panel today. Continue prescribed dietary plan  Orders: -     metFORMIN HCl ER; Take 1 tablet (500 mg total) by mouth daily with breakfast.  Dispense: 30 tablet; Refill: 0 -     Comprehensive metabolic panel -  Vitamin B12 -     Hemoglobin A1c  Other depression with emotional eating -     buPROPion HCl ER (XL); Take 1 tablet (150 mg total) by mouth daily.  Dispense: 30 tablet; Refill: 0  Vitamin D deficiency Assessment & Plan: Last vitamin D Lab Results  Component Value Date   VD25OH 25.8 (L) 12/09/2022   She is doing well on prescription vitamin D 50,000 IU once weekly.  Energy level is improving.  She denies adverse side  effects.  Recheck vitamin D level today with a target level 50-70.  Orders: -     Vitamin D (Ergocalciferol); Take 1 capsule (50,000 Units total) by mouth every 7 (seven) days.  Dispense: 5 capsule; Refill: 0 -     VITAMIN D 25 Hydroxy (Vit-D Deficiency, Fractures)  Morbid obesity (HCC) with starting BMI 35  BMI 32.0-32.9,adult      She was informed of the importance of frequent follow up visits to maximize her success with intensive lifestyle modifications for her multiple health conditions.   ATTESTASTION STATEMENTS:  Reviewed by clinician on day of visit: allergies, medications, problem list, medical history, surgical history, family history, social history, and previous encounter notes pertinent to obesity diagnosis.   I have personally spent 30 minutes total time today in preparation, patient care, nutritional counseling and documentation for this visit, including the following: review of clinical lab tests; review of medical tests/procedures/services.      [name], DO DABFM, DABOM [facility] [address] [place], [place] [address] [phone]

## 2023-03-31 NOTE — Assessment & Plan Note (Signed)
Last vitamin D Lab Results  Component Value Date   VD25OH 25.8 (L) 12/09/2022   She is doing well on prescription vitamin D 50,000 IU once weekly.  Energy level is improving.  She denies adverse side effects.  Recheck vitamin D level today with a target level 50-70.

## 2023-04-01 LAB — VITAMIN B12: Vitamin B-12: 1089 pg/mL (ref 232–1245)

## 2023-04-01 LAB — COMPREHENSIVE METABOLIC PANEL
ALT: 21 IU/L (ref 0–32)
AST: 18 IU/L (ref 0–40)
Albumin/Globulin Ratio: 1.8 (ref 1.2–2.2)
Albumin: 4.4 g/dL (ref 3.8–4.9)
Alkaline Phosphatase: 209 IU/L — ABNORMAL HIGH (ref 44–121)
BUN/Creatinine Ratio: 22 (ref 9–23)
BUN: 20 mg/dL (ref 6–24)
Bilirubin Total: 0.2 mg/dL (ref 0.0–1.2)
CO2: 22 mmol/L (ref 20–29)
Calcium: 9.9 mg/dL (ref 8.7–10.2)
Chloride: 102 mmol/L (ref 96–106)
Creatinine, Ser: 0.9 mg/dL (ref 0.57–1.00)
Globulin, Total: 2.5 g/dL (ref 1.5–4.5)
Glucose: 77 mg/dL (ref 70–99)
Potassium: 4.9 mmol/L (ref 3.5–5.2)
Sodium: 142 mmol/L (ref 134–144)
Total Protein: 6.9 g/dL (ref 6.0–8.5)
eGFR: 76 mL/min/{1.73_m2} (ref >59)

## 2023-04-01 LAB — VITAMIN D 25 HYDROXY (VIT D DEFICIENCY, FRACTURES): Vit D, 25-Hydroxy: 74.1 ng/mL (ref 30.0–100.0)

## 2023-04-01 LAB — HEMOGLOBIN A1C
Est. average glucose Bld gHb Est-mCnc: 114 mg/dL
Hgb A1c MFr Bld: 5.6 % (ref 4.8–5.6)

## 2023-04-04 ENCOUNTER — Telehealth (INDEPENDENT_AMBULATORY_CARE_PROVIDER_SITE_OTHER): Payer: 59 | Admitting: Psychology

## 2023-04-04 DIAGNOSIS — F331 Major depressive disorder, recurrent, moderate: Secondary | ICD-10-CM | POA: Diagnosis not present

## 2023-04-04 DIAGNOSIS — F909 Attention-deficit hyperactivity disorder, unspecified type: Secondary | ICD-10-CM

## 2023-04-04 DIAGNOSIS — F5089 Other specified eating disorder: Secondary | ICD-10-CM

## 2023-04-04 NOTE — Progress Notes (Signed)
  Office: [phone]  /  Fax: [phone]    Date: Apr 04, 2023    Appointment Start Time: 2:34pm Duration: 18 minutes Provider: Lawerance Cruel, Psy.D. Type of Session: Individual Therapy  Location of Patient: Home (private location) Location of Provider: Provider's Home (private office) Type of Contact: Telepsychological Visit via MyChart Video Visit  Session Content: Gloria Smith is a 52 y.o. female presenting for a follow-up appointment to address the previously established treatment goal of increasing coping skills.Today's appointment was a telepsychological visit. Gloria Smith provided verbal consent for today's telepsychological appointment and she is aware she is responsible for securing confidentiality on her end of the session. Prior to proceeding with today's appointment, Gloria Smith's physical location at the time of this appointment was obtained as well a phone number she could be reached at in the event of technical difficulties. Gloria Smith and this provider participated in today's telepsychological service.   This provider conducted a brief check-in. Gloria Smith shared things "have been great." She stated she is traveling to the [place] in a couple days for one week. She shared eating continues to go well and she is losing weight. She also continues to report a reduction in emotional eating behaviors. Psychoeducation regarding making better choices and engaging in portion control during the holidays/celebrations/vacations was provided. More specifically, this provider discussed the following strategies: coming to meals hungry, but not starving; avoid filling up on appetizers; managing portion sizes; not completely depriving yourself; making the plate colorful (e.g., vegetables); pacing yourself (e.g., waiting 10 minutes before going back for seconds); taking advantage of the nutritious foods; practicing mindfulness; staying hydrated; and avoid bringing home leftovers. Furthermore, termination planning was discussed.  Gloria Smith was receptive to a follow-up appointment in 3-4 weeks and an additional follow-up/termination appointment in 3-4 weeks after that. Overall, Gloria Smith was receptive to today's appointment as evidenced by openness to sharing, responsiveness to feedback, and willingness to implement discussed strategies .  Mental Status Examination:  Appearance: neat Behavior: appropriate to circumstances Mood: neutral Affect: mood congruent Speech: WNL Eye Contact: appropriate Psychomotor Activity: WNL Gait: unable to assess Thought Process: linear, logical, and goal directed and no evidence or endorsement of suicidal, homicidal, and self-harm ideation, plan and intent  Thought Content/Perception: no hallucinations, delusions, bizarre thinking or behavior endorsed or observed Orientation: AAOx4 Memory/Concentration: memory, attention, language, and fund of knowledge intact  Insight: good Judgment: good  Interventions:  Conducted a brief chart review Provided empathic reflections and validation Provided positive reinforcement Employed supportive psychotherapy interventions to facilitate reduced distress and to improve coping skills with identified stressors Discussed termination planning Psychoeducation provided regarding strategies for celebrations/holidays/vacations  DSM-5 Diagnosis(es):  F50.89 Other Specified Feeding or Eating Disorder, Emotional Eating Behaviors, F33.1 Major Depressive Disorder, Recurrent Episode, Moderate, With Anxious Distress, and F90.9 Unspecified Attention-Deficit/Hyperactivity Disorder   Treatment Goal & Progress: During the initial appointment with this provider, the following treatment goal was established: increase coping skills. Gloria Smith has demonstrated progress in her goal as evidenced by increased awareness of hunger patterns, increased awareness of triggers for emotional eating behaviors, and reduction in emotional eating behaviors . Gloria Smith also continues to demonstrate  willingness to engage in learned skill(s).  Plan: The next appointment is scheduled for 05/02/2023 at 2:30pm, which will be via MyChart Video Visit. The next session will focus on working towards the established treatment goal. Gloria Smith will continue with her primary therapist.

## 2023-04-10 ENCOUNTER — Other Ambulatory Visit: Payer: Self-pay | Admitting: Nurse Practitioner

## 2023-04-10 DIAGNOSIS — C50412 Malignant neoplasm of upper-outer quadrant of left female breast: Secondary | ICD-10-CM

## 2023-04-11 ENCOUNTER — Encounter: Payer: Self-pay | Admitting: Hematology

## 2023-04-26 ENCOUNTER — Other Ambulatory Visit (INDEPENDENT_AMBULATORY_CARE_PROVIDER_SITE_OTHER): Payer: Self-pay | Admitting: Family Medicine

## 2023-04-26 DIAGNOSIS — F3289 Other specified depressive episodes: Secondary | ICD-10-CM

## 2023-05-02 ENCOUNTER — Telehealth (INDEPENDENT_AMBULATORY_CARE_PROVIDER_SITE_OTHER): Payer: 59 | Admitting: Psychology

## 2023-05-02 DIAGNOSIS — F5089 Other specified eating disorder: Secondary | ICD-10-CM

## 2023-05-02 DIAGNOSIS — F331 Major depressive disorder, recurrent, moderate: Secondary | ICD-10-CM | POA: Diagnosis not present

## 2023-05-02 DIAGNOSIS — F909 Attention-deficit hyperactivity disorder, unspecified type: Secondary | ICD-10-CM | POA: Diagnosis not present

## 2023-05-02 NOTE — Progress Notes (Signed)
  Office: [phone]  /  Fax: [phone]    Date: May 02, 2023    Appointment Start Time: 2:30pm Duration: 18 minutes Provider: Lawerance Cruel, Psy.D. Type of Session: Individual Therapy  Location of Patient: Work (private location) Location of Provider: Provider's Home (private office) Type of Contact: Telepsychological Visit via MyChart Video Visit  Session Content: Gloria Smith is a 52 y.o. female presenting for a follow-up appointment to address the previously established treatment goal of increasing coping skills.Today's appointment was a telepsychological visit. [name] provided verbal consent for today's telepsychological appointment and she is aware she is responsible for securing confidentiality on her end of the session. Prior to proceeding with today's appointment, [name]'s physical location at the time of this appointment was obtained as well a phone number she could be reached at in the event of technical difficulties. [name] and this provider participated in today's telepsychological service.   This provider conducted a brief check-in. [name] shared about her recent trip to the [place]. Discussed what went well and what she could do differently during future vacations. Reviewed progress to date. [name] was receptive to today's appointment as evidenced by openness to sharing, responsiveness to feedback, and willingness to continue engaging in learned skills.  Mental Status Examination:  Appearance: neat Behavior: appropriate to circumstances Mood: neutral Affect: mood congruent Speech: WNL Eye Contact: appropriate Psychomotor Activity: WNL Gait: unable to assess Thought Process: linear, logical, and goal directed and no evidence or endorsement of suicidal, homicidal, and self-harm ideation, plan and intent  Thought Content/Perception: no hallucinations, delusions, bizarre thinking or behavior endorsed or observed Orientation: AAOx4 Memory/Concentration: intact Insight: good Judgment:  good  Interventions:  Conducted a brief chart review Provided empathic reflections and validation Reviewed content from the previous session Provided positive reinforcement Employed supportive psychotherapy interventions to facilitate reduced distress and to improve coping skills with identified stressors  DSM-5 Diagnosis(es):  F50.89 Other Specified Feeding or Eating Disorder, Emotional Eating Behaviors, F33.1 Major Depressive Disorder, Recurrent Episode, Moderate, With Anxious Distress, and F90.9 Unspecified Attention-Deficit/Hyperactivity Disorder   Treatment Goal & Progress: During the initial appointment with this provider, the following treatment goal was established: increase coping skills. [name] has demonstrated progress in her goal as evidenced by increased awareness of hunger patterns, increased awareness of triggers for emotional eating behaviors, and reduction in emotional eating behaviors . [name] also continues to demonstrate willingness to engage in learned skill(s).  Plan: The next appointment is scheduled for 06/06/2023 at 4pm, which will be via MyChart Video Visit. The next session will focus on working towards the established treatment goal and termination.

## 2023-05-05 ENCOUNTER — Encounter (INDEPENDENT_AMBULATORY_CARE_PROVIDER_SITE_OTHER): Payer: Self-pay | Admitting: Family Medicine

## 2023-05-05 ENCOUNTER — Ambulatory Visit (INDEPENDENT_AMBULATORY_CARE_PROVIDER_SITE_OTHER): Payer: 59 | Admitting: Family Medicine

## 2023-05-05 VITALS — BP 97/53 | HR 82 | Temp 97.9°F | Ht 64.0 in | Wt 186.0 lb

## 2023-05-05 DIAGNOSIS — R7303 Prediabetes: Secondary | ICD-10-CM | POA: Diagnosis not present

## 2023-05-05 DIAGNOSIS — E559 Vitamin D deficiency, unspecified: Secondary | ICD-10-CM

## 2023-05-05 DIAGNOSIS — F3289 Other specified depressive episodes: Secondary | ICD-10-CM | POA: Diagnosis not present

## 2023-05-05 DIAGNOSIS — Z6831 Body mass index (BMI) 31.0-31.9, adult: Secondary | ICD-10-CM

## 2023-05-05 MED ORDER — BUPROPION HCL ER (XL) 300 MG PO TB24
300.0000 mg | ORAL_TABLET | Freq: Every day | ORAL | 0 refills | Status: DC
Start: 2023-05-05 — End: 2023-06-07

## 2023-05-05 MED ORDER — METFORMIN HCL ER 500 MG PO TB24: 500.0000 mg | ORAL_TABLET | Freq: Every day | ORAL | 0 refills | Status: DC

## 2023-05-05 NOTE — Progress Notes (Signed)
Office: [phone]  /  Fax: [phone]  WEIGHT SUMMARY AND BIOMETRICS  Starting Date: 12/09/22  Starting Weight: 208 lb   Weight Lost Since Last Visit: 1 lb   Vitals Temp: 97.9 F (36.6 C) BP: (!) 97/53 Pulse Rate: 82 SpO2: 97 %   Body Composition  Body Fat %: 40.5 % Fat Mass (lbs): 75.4 lbs Muscle Mass (lbs): 105 lbs Total Body Water (lbs): 76.2 lbs Visceral Fat Rating : 10     HPI  Chief Complaint: OBESITY  Gloria Smith is here to discuss her progress with her obesity treatment plan. She is on the the Category 2 Plan and states she is following her eating plan approximately 90 % of the time. She states she is exercising walking 6,000 steps 6 times per week.  Interval History:  Since last office visit she is down 1 lb She is feeling a She has a gym membership to [facility] but has not been doing  She is tracking her steps and occasionally does swimming She is getting her protein on her meal plan She went to [place] for a week and did a lot of walking and was mindful of her choices  RMR 1670 Feeling hungry on Cat 2  Plans to increase walking time and get to the gym 2 days/ wk Move to cat 3 meal plan-- will change  Pharmacotherapy: metformin 500 mg daily with food for prediabetes and wellbutrin XL 150 mg daily  PHYSICAL EXAM:  Blood pressure (!) 97/53, pulse 82, temperature 97.9 F (36.6 C), height 5\' 4"  (1.626 m), weight 186 lb (84.4 kg), SpO2 97 %. Body mass index is 31.93 kg/m.  General: She is overweight, cooperative, alert, well developed, and in no acute distress. PSYCH: Has normal mood, affect and thought process.   Lungs: Normal breathing effort, no conversational dyspnea.   ASSESSMENT AND PLAN  TREATMENT PLAN FOR OBESITY:  Recommended Dietary Goals  [name] is currently in the action stage of change. As such, her goal is to continue weight management plan. She has agreed to the Category 3 Plan.  Behavioral Intervention  We discussed the  following Behavioral Modification Strategies today: increasing lean protein intake, decreasing simple carbohydrates , increasing vegetables, increasing lower glycemic fruits, increasing water intake, work on meal planning and preparation, keeping healthy foods at home, avoiding temptations and identifying enticing environmental cues, continue to practice mindfulness when eating, and planning for success.  Additional resources provided today: NA  Recommended Physical Activity Goals  [name] has been advised to work up to 150 minutes of moderate intensity aerobic activity a week and strengthening exercises 2-3 times per week for cardiovascular health, weight loss maintenance and preservation of muscle mass.   She has agreed to [employer] strengthening exercises with a goal of 2-3 sessions a week  and Increase the intensity, frequency or duration of aerobic exercises    Pharmacotherapy changes for the treatment of obesity: increased Wellbutrin XL to 300 mg daily  ASSOCIATED CONDITIONS ADDRESSED TODAY  Pre-diabetes Assessment & Plan: Lab Results  Component Value Date   HGBA1C 5.6 03/31/2023   Reviewed lab from last visit A1c has improved from 6--> 5.6 with 22 lb of weight loss in the past 5 mos She has lost > 10% TBW loss with our program She is doing well on metformin 500 mg once daily with food  Continue to work on a low sugar/ low starch diet, increasing steps to 8,000 per day and continue metformin 500 mg once daily. Recheck A1c with  fasting insulin in 4 mos  Orders: -     metFORMIN HCl ER; Take 1 tablet (500 mg total) by mouth daily with breakfast.  Dispense: 30 tablet; Refill: 0  Other depression with emotional eating Assessment & Plan: Seeing Dr Dewaine Conger for CBT and has done well with emotional eating and mood on Wellbutrin XL 150 mg daily but lately has felt more depressed.  She has a good support system at home.  She is working to improve sleeping at night and frequency of regular  exercise.  Increase Wellbutrin XL to 300 mg once daily  Orders: -     buPROPion HCl ER (XL); Take 1 tablet (300 mg total) by mouth daily.  Dispense: 30 tablet; Refill: 0  Morbid obesity (HCC) with starting BMI 35 Assessment & Plan: Reviewed her bioimpedence results and her overall progress, down 22 lb in 5 mos of medically supervised weight management which is a >10% TBW loss.  Her weight has been fairly stable and on recall, she is under eating.  Explained the importance of getting in enough food and moved her to cat 3 meal plan given her stall, last RMR and plan to ramp up workouts.    Track steps, increasing goal to >8,000 daily and add in resistance training workouts at the gym 2 x a week. Reviewed realistic weight goals now that her BMI is 31. Her goal was 140 lb but it appears her weight prior to cancer treatment in 2017 was 163 (not 140). Will reset her goal weight to 165 lb.    BMI 31.0-31.9,adult  Vitamin D deficiency Assessment & Plan: Last vitamin D Lab Results  Component Value Date   VD25OH 74.1 03/31/2023   Reviewed lab from last visit Vitamin D level improved on RX vitamin D 50,000 IU weekly Energy level has improved  Discontinue RX vitamin D.  Change to OTC vitamin D 2,000 IU daily for maintenance Recheck level in 4 mos       She was informed of the importance of frequent follow up visits to maximize her success with intensive lifestyle modifications for her multiple health conditions.   ATTESTASTION STATEMENTS:  Reviewed by clinician on day of visit: allergies, medications, problem list, medical history, surgical history, family history, social history, and previous encounter notes pertinent to obesity diagnosis.   I have personally spent 30 minutes total time today in preparation, patient care, nutritional counseling and documentation for this visit, including the following: review of clinical lab tests; review of medical tests/procedures/services.       [name], DO DABFM, DABOM [facility] [address] [place], [place] [address] [phone]

## 2023-05-05 NOTE — Assessment & Plan Note (Signed)
Last vitamin D Lab Results  Component Value Date   VD25OH 74.1 03/31/2023   Reviewed lab from last visit Vitamin D level improved on RX vitamin D 50,000 IU weekly Energy level has improved  Discontinue RX vitamin D.  Change to OTC vitamin D 2,000 IU daily for maintenance Recheck level in 4 mos

## 2023-05-05 NOTE — Assessment & Plan Note (Signed)
Lab Results  Component Value Date   HGBA1C 5.6 03/31/2023   Reviewed lab from last visit A1c has improved from 6--> 5.6 with 22 lb of weight loss in the past 5 mos She has lost > 10% TBW loss with our program She is doing well on metformin 500 mg once daily with food  Continue to work on a low sugar/ low starch diet, increasing steps to 8,000 per day and continue metformin 500 mg once daily. Recheck A1c with fasting insulin in 4 mos

## 2023-05-05 NOTE — Assessment & Plan Note (Signed)
Seeing Dr Dewaine Conger for CBT and has done well with emotional eating and mood on Wellbutrin XL 150 mg daily but lately has felt more depressed.  She has a good support system at home.  She is working to improve sleeping at night and frequency of regular exercise.  Increase Wellbutrin XL to 300 mg once daily

## 2023-05-05 NOTE — Assessment & Plan Note (Addendum)
Reviewed her bioimpedence results and her overall progress, down 22 lb in 5 mos of medically supervised weight management which is a >10% TBW loss.  Her weight has been fairly stable and on recall, she is under eating.  Explained the importance of getting in enough food and moved her to cat 3 meal plan given her stall, last RMR and plan to ramp up workouts.    Track steps, increasing goal to >8,000 daily and add in resistance training workouts at the gym 2 x a week. Reviewed realistic weight goals now that her BMI is 31. Her goal was 140 lb but it appears her weight prior to cancer treatment in 2017 was 163 (not 140). Will reset her goal weight to 165 lb.

## 2023-05-06 ENCOUNTER — Other Ambulatory Visit (INDEPENDENT_AMBULATORY_CARE_PROVIDER_SITE_OTHER): Payer: Self-pay | Admitting: Family Medicine

## 2023-05-06 DIAGNOSIS — F3289 Other specified depressive episodes: Secondary | ICD-10-CM

## 2023-05-09 ENCOUNTER — Ambulatory Visit (INDEPENDENT_AMBULATORY_CARE_PROVIDER_SITE_OTHER): Payer: 59 | Admitting: Psychology

## 2023-05-09 ENCOUNTER — Encounter: Payer: Self-pay | Admitting: Psychology

## 2023-05-09 DIAGNOSIS — F411 Generalized anxiety disorder: Secondary | ICD-10-CM

## 2023-05-09 NOTE — Progress Notes (Signed)
                Jamelyn Bovard, PhD 
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
## 2023-05-10 ENCOUNTER — Other Ambulatory Visit (INDEPENDENT_AMBULATORY_CARE_PROVIDER_SITE_OTHER): Payer: Self-pay | Admitting: Family Medicine

## 2023-05-10 DIAGNOSIS — F3289 Other specified depressive episodes: Secondary | ICD-10-CM

## 2023-05-11 ENCOUNTER — Other Ambulatory Visit (INDEPENDENT_AMBULATORY_CARE_PROVIDER_SITE_OTHER): Payer: Self-pay | Admitting: Family Medicine

## 2023-05-11 DIAGNOSIS — E559 Vitamin D deficiency, unspecified: Secondary | ICD-10-CM

## 2023-05-18 ENCOUNTER — Other Ambulatory Visit: Payer: Self-pay | Admitting: Nurse Practitioner

## 2023-05-18 DIAGNOSIS — Z170 Estrogen receptor positive status [ER+]: Secondary | ICD-10-CM

## 2023-05-24 ENCOUNTER — Ambulatory Visit: Payer: Self-pay | Admitting: Psychology

## 2023-05-24 ENCOUNTER — Encounter: Payer: Self-pay | Admitting: Psychology

## 2023-05-24 DIAGNOSIS — F411 Generalized anxiety disorder: Secondary | ICD-10-CM

## 2023-05-24 DIAGNOSIS — F331 Major depressive disorder, recurrent, moderate: Secondary | ICD-10-CM

## 2023-05-24 NOTE — Progress Notes (Signed)
                Gloria Manders, PhD 

## 2023-05-24 NOTE — Progress Notes (Signed)
[facility] Counselor/Therapist Progress Note  Patient ID: Gloria Smith, MRN: [id number],    Date: 05/24/2023  Time Spent: 8:30 - 11:30 am   Treatment Type: Testing  Met with patient for testing session.  Patient was at the clinic and session was conducted from therapist's office in person.    Reported Symptoms/Reason for Referral: Patient presents with a history of chronic worry with some depressed mood, which patient attributes to not being able to function adequately as an adult. Difficulty with regulating her speech and behavior were reported along with trouble paying attention, distractibility, poor organization, frequent losing or forgetting, physical restlessness, and impulsivity. Patient indicated having these difficulties since childhood but they were attributed to laziness and misbehavior by adults at the time. Testing recommended to evaluate for ADHD along with other conditions that may be contributing to attention and behavior.   Mental Status Exam: Appearance:  Casual and Neatly Groomed     Behavior: Appropriate  Motor: Normal  Speech/Language:  Clear and Coherent and Normal Rate  Affect: Appropriate  Mood: euthymic  Thought process: Mostly normal - some word retrieval difficulty  Thought content:   WNL  Sensory/Perceptual disturbances:   WNL  Orientation: oriented to person, place, time/date, and situation  Attention: Good  Concentration: Fair  Memory: WNL  Fund of knowledge:  Good  Insight:   Good  Judgment:  Good  Impulse Control: Fair   Risk Assessment: Danger to Self:  No Self-injurious Behavior: No Danger to Others: No Duty to Warn:no Physical Aggression / Violence:No   Subjective: Testing included the WAIS-IV (2.0 hrs. for testing and scoring) along with the CNS Vital signs (1.0 hrs.).    Patient was cooperative and displayed good effort. Attention and concentration were adequate overall, although patient exhibited several instances  of self-correction, asking questions to be repeated, and solving problems correctly after the standardized time limit.  Her performance was inconsistent, missing several relatively easy problems.  She exhibited word retrieval difficulty and took a long time answering questions, resulting in a longer than expected completion time.  Mood was euthymic with appropriate affect.  The results appear representative of current functioning.    Diagnosis:Generalized anxiety disorder  Major depressive disorder, recurrent episode, moderate (HCC)  Plan: Testing to continue next session with the BRIEF-A, CAARS-2, and PAI followed by report writing and interactive feedback.    Bryson Dames, PhD

## 2023-05-25 ENCOUNTER — Ambulatory Visit: Payer: 59 | Admitting: Psychology

## 2023-05-25 ENCOUNTER — Encounter: Payer: Self-pay | Admitting: Psychology

## 2023-05-25 DIAGNOSIS — F331 Major depressive disorder, recurrent, moderate: Secondary | ICD-10-CM

## 2023-05-25 DIAGNOSIS — F411 Generalized anxiety disorder: Secondary | ICD-10-CM

## 2023-05-25 NOTE — Progress Notes (Signed)
[place] [facility] Counselor/Therapist Progress Note  Patient ID: Gloria Smith, MRN: [id number],    Date: 05/25/2023  Time Spent: 12:30 - 1:30 pm   Treatment Type: Testing  Met with patient for testing session.  Patient was at the clinic and session was conducted from therapist's office in person.    Reported Symptoms/Reason for Referral: Patient presents with a history of chronic worry with some depressed mood, which patient attributes to not being able to function adequately as an adult. Difficulty with regulating her speech and behavior were reported along with trouble paying attention, distractibility, poor organization, frequent losing or forgetting, physical restlessness, and impulsivity. Patient indicated having these difficulties since childhood but they were attributed to laziness and misbehavior by adults at the time. Testing recommended to evaluate for ADHD along with other conditions that may be contributing to attention and behavior.   Mental Status Exam: Appearance:  Casual and Neatly Groomed     Behavior: Appropriate  Motor: Normal  Speech/Language:  Clear and Coherent and Normal Rate  Affect: Appropriate  Mood: euthymic  Thought process: Mostly normal - some word retrieval difficulty  Thought content:   WNL  Sensory/Perceptual disturbances:   WNL  Orientation: oriented to person, place, time/date, and situation  Attention: Good  Concentration: Fair  Memory: WNL  Fund of knowledge:  Good  Insight:   Good  Judgment:  Good  Impulse Control: Fair   Risk Assessment: Danger to Self:  No Self-injurious Behavior: No Danger to Others: No Duty to Warn:no Physical Aggression / Violence:No   Subjective: Testing included the BRIEF-A (0.25 hrs. for testing and scoring) along with the CAARS-2 (0.25 hrs.) and PAI (0.5 hrs.).    Patient was cooperative and displayed good effort. Attention and concentration were adequate overall, although patient exhibited  several instances of self-correction, asking questions to be repeated, and solving problems correctly after the standardized time limit.  Her performance was inconsistent, missing several relatively easy problems.  She exhibited word retrieval difficulty and took a long time answering questions, resulting in a longer than expected completion time.  Mood was euthymic with appropriate affect.  The results appear representative of current functioning.    Diagnosis:Generalized anxiety disorder  Major depressive disorder, recurrent episode, moderate (HCC)  Plan: Testing complete. Report writing to be conducted followed by interactive feedback next session.     Bryson Dames, PhD                 Bryson Dames, PhD

## 2023-05-30 ENCOUNTER — Encounter: Payer: Self-pay | Admitting: Psychology

## 2023-05-30 NOTE — Progress Notes (Signed)
Gloria Smith is a 52 y.o. female patient Report writing competed ( 3 hrs.).  Interactive feedback to be conducted next session. Report to be attached to the feedback progress note.  Patient/Guardian was advised Release of Information must be obtained prior to any record release in order to collaborate their care with an outside provider. Patient/Guardian was advised if they have not already done so to contact the registration department to sign all necessary forms in order for [place] to release information regarding their care.   Consent: Patient/Guardian gives verbal consent for treatment and assignment of benefits for services provided during this visit. Patient/Guardian expressed understanding and agreed to proceed.    Bryson Dames, PhD

## 2023-06-03 ENCOUNTER — Encounter: Payer: Self-pay | Admitting: Psychology

## 2023-06-03 ENCOUNTER — Ambulatory Visit: Payer: 59 | Admitting: Psychology

## 2023-06-03 DIAGNOSIS — F331 Major depressive disorder, recurrent, moderate: Secondary | ICD-10-CM

## 2023-06-03 DIAGNOSIS — F902 Attention-deficit hyperactivity disorder, combined type: Secondary | ICD-10-CM | POA: Diagnosis not present

## 2023-06-03 DIAGNOSIS — F4310 Post-traumatic stress disorder, unspecified: Secondary | ICD-10-CM | POA: Diagnosis not present

## 2023-06-03 NOTE — Progress Notes (Signed)
[facility] Counselor/Therapist Progress Note  Patient ID: Gloria Smith, MRN: [id number],    Date: 06/03/2023  Time Spent: 8:30 - 9:15 am   Treatment Type: Testing - Feedback Session  Met with patient to review results of testing.  Patient was at home and session was conducted from therapist's office via video conferencing. Patient understood the limitations of video appointments and verbally consented to telehealth.       Reported Symptoms: Patient presents with a history of chronic worry with some depressed mood, which patient attributes to not being able to function adequately as an adult. Difficulty with regulating her speech and behavior were reported along with trouble paying attention, distractibility, poor organization, frequent losing or forgetting, physical restlessness, and impulsivity. Patient indicated having these difficulties since childhood but they were attributed to laziness and misbehavior by adults at the time. Testing recommended to evaluate for ADHD along with other conditions that may be contributing to attention and behavior.    Subjective: Interactive feedback was conducted (1 hr.).  It was discussed how patient met the criterion for ADHD, depression, and PTSD along with how these conditions affect her ability to learn and regulate her attention, behavior, and emotion.  Recommendations included discussing results with PCP, developing a visual organization system, and continuing individual counseling to work on awareness, organization, and emotion regulation.  Patient expressed agreement with the results and recommendations.     Total Time of Testing: 8 hrs. Testing and Scoring: 4 hrs. Interactive Feedback:1 hr. Report Writing: 3 hrs.   Diagnosis: Attention Deficit Hyperactivity Disorder - Combined Presentation Major Depressive Disorder - Recurrent - Moderate Post Traumatic Stress Disorder  Plan: Report to be sent to patient and referring  provider.     Bryson Dames, PhD

## 2023-06-03 NOTE — Progress Notes (Signed)
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  Consult with a physician regarding any changes to physical regimen.   Support for ADHD Adults Websites: Children and Adults with Attention-Deficit/Hyperactivity Disorder (CHADD): chadd.org.  Attention Deficit Disorder Association (ADDA) HotterNames.de ADD Resources: addresources.org ADD Warehouse: addwarehouse.com World Federation of ADHD: adhd-federation.org ADD Consults: FightListings.se. Compilation of ADHD resources: https://www.harrell.com/  Fact Sheets about ADHD: HavanaWatch.co.nz https://keller.info/ https://www.nhs.uk/conditions/attention-deficit-hyperactivity-disorder-adhd/treatment/ Employment Accommodations and Compliance: ForumChats.com.au.cfm?  Videos: "Failing at Normal: An ADHD Success Story" by Olena Leatherwood Barkley's YouTube Channel: http://www.mitchell-reyes.biz/  Books: "Taking Charge of Adult ADHD Second  Edition" by Dr. Janese Banks "The ADHD Effect on Marriage" by Annamarie Dawley "The Couples Guide to Thriving with ADHD" by Annamarie Dawley "Mindfulness Meditations for ADHD" by Aura Camps        Executive System Intervention Overview Executive dysfunction can significantly impact an individual's ability to function at home, at school, at work, or in the community. Several different approaches to executive function intervention have been developed by neuropsychologists, rehabilitation specialists, and others that are aimed at helping individuals cope with executive dysfunction. One type of intervention involves the application of cognitive remediation techniques that typically emphasize repeated practice with tasks, such as memory and attention tasks, that are intended to improve the deficient skill.   Another type of intervention involves teaching compensatory strategies. These strategies are designed to circumvent rather than directly improve deficits and also have demonstrated effectiveness in  a number of patient populations. Still others emphasize the interaction of the individual within the environment and how antecedent environmental modifications or accommodations can facilitate executive functions. It should be noted that these approaches to dealing with executive dysfunction need not be mutually exclusive and many intervention programs are characterized by a hybrid approach.   Compensatory strategies themselves can take several forms including using external aides (e.g., use of a notebook), learning cognitive strategies (e.g., verbalization), and making environmental modifications (e.g., keeping workspace clutter-free). Research has demonstrated that both healthy adults as well as individuals who have executive deficits commonly rely on external aids for executive and other cognitive processes. The probability of success with compensatory strategies can be enhanced by building on  an individual's existing strategies, systematically training the new strategies, and tailoring the compensatory strategies to the individual's unique needs and environmental contexts. More frequent use of aides or strategies and the use of a greater variety of aides is helpful when it comes to memory, and this also may hold true for executive dysfunction.   Adherence to routines and resistance to change may reflect Ms. Orvis Brill' need for predictability in her environment. An essential tenet of intervention is to facilitate feelings of security by maintaining a set of basic routines, then adjusting routines slightly in a stepwise fashion. Larger steps may provoke resistance and distress.  An individual who has difficulties shifting set can often adjust to changes in schedule or routine with the use of visual organizers such as pictures, schedules, planners, and calendar boards. This will let Ms. Edmonson know the order of activities for the day and can alert her to variations in the usual sequence of events before they occur.  Displaying a daily schedule and reviewing it at the outset of the day can help an adult like Ms. Edmonson anticipate the sequence of events and can serve as a useful reminder of any changes in her daily routine. Any changes in scheduled activities, persons, or events can be placed on Ms. Edmonson's schedule and called to her attention with as much advance notice as possible. This provides more time for her to adjust to the change.    An individual who has difficulties shifting attention often needs to focus on only one task at a time. Presenting one task at a time and limiting choices to only one or two may be helpful.  Ms. Orvis Brill might benefit from practicing her mental flexibility. Working with two or three familiar tasks and rotating them at regular intervals may help develop greater flexibility and help her become more accustomed to shifting.  Ms. Orvis Brill might benefit from external  prompting to shift attention, behavior, or cognitive set from one activity or focus to the next. This may include, for example, the use of a timer on a watch or other device.  Some individuals can benefit from set time limits for each task before a shift to the next task is required.  Ms. Orvis Brill might work on one activity or assignment for a set period then an alternative activity for the next period. Use of a timer can facilitate Ms. Edmonson's adjustment to change in activity.  Having regularly scheduled times during the day in which different tasks are carried out, separated by rest breaks or leisure activities, can facilitate shifting. For example, Ms. Edmonson might work on task A each morning and task B, each afternoon.  Sometimes working in small groups or being paired with another person can help individuals shift their focus or cognitive set.  Others can model that it is time to change, cuing Ms. Edmonson by their behavior.  Individuals such as Ms. Edmonson have difficulty monitoring the impact of their behavior on others. Having a supportive partner/therapist/teacher provide her with subtle cues to help recognize the effects of her behavior may be helpful. Similarly, providing opportunities for self-monitoring in contexts where she can receive constructive feedback, such as group therapy or social skills training, may also prove beneficial.  Ms. Orvis Brill may not be able to consider the impact of her behavior in the immediate situation. It may be helpful or necessary to discuss behavior removed from the situation.  It may be helpful to videotape Ms. Edmonson interacting socially and then review it together. This allows her to see herself from another's perspective. Discussion of the videotape with someone, such as a counselor or therapist, will be important. This method should be considered carefully and approached collaboratively with her. Although videotaping can be a powerful tool, there also is  potential for emotional consequences and negative effects on self-esteem.  A social skills group may be a helpful venue to increase Ms. Edmonson's awareness of the impact her behavior has on others. This can provide not only direct skill training but also an opportunity for helpful feedback from a counselor or peers in a safe setting. Some individuals like Ms. Edmonson may benefit from receiving reading material related to the nature and causes of their cognitive and behavioral problems. The provision of articles or "fact sheets" can help improve awareness, increase responsiveness to more direct interventions, and facilitate understanding between individuals with cognitive deficits and their caregivers, family members, and supervisors.    Salvatore Decent Avaree Gilberti, Ph.D. Licensed Psychologist - [facility] [place] Licensed psychologist [phone]               Bryson Dames, PhD

## 2023-06-03 NOTE — Progress Notes (Signed)
                Antionetta Ator, PhD 

## 2023-06-04 ENCOUNTER — Other Ambulatory Visit (INDEPENDENT_AMBULATORY_CARE_PROVIDER_SITE_OTHER): Payer: Self-pay | Admitting: Family Medicine

## 2023-06-04 DIAGNOSIS — F3289 Other specified depressive episodes: Secondary | ICD-10-CM

## 2023-06-06 ENCOUNTER — Telehealth (INDEPENDENT_AMBULATORY_CARE_PROVIDER_SITE_OTHER): Payer: 59 | Admitting: Psychology

## 2023-06-06 DIAGNOSIS — F331 Major depressive disorder, recurrent, moderate: Secondary | ICD-10-CM | POA: Diagnosis not present

## 2023-06-06 DIAGNOSIS — F5089 Other specified eating disorder: Secondary | ICD-10-CM

## 2023-06-06 DIAGNOSIS — F909 Attention-deficit hyperactivity disorder, unspecified type: Secondary | ICD-10-CM | POA: Diagnosis not present

## 2023-06-06 NOTE — Progress Notes (Signed)
Office: [phone]  /  Fax: [phone]    Date: June 06, 2023  Appointment Start Time: 4:02pm Duration: 19 minutes Provider: Lawerance Cruel, Psy.D. Type of Session: Individual Therapy  Location of Patient: Home (private location) Location of Provider: Provider's Home (private office) Type of Contact: Telepsychological Visit via MyChart Video Visit  Session Content: Gloria Smith is a 52 y.o. female presenting for a follow-up appointment to address the previously established treatment goal of increasing coping skills.Today's appointment was a telepsychological visit. Isabell provided verbal consent for today's telepsychological appointment and she is aware she is responsible for securing confidentiality on her end of the session. Prior to proceeding with today's appointment, Anahlia's physical location at the time of this appointment was obtained as well a phone number she could be reached at in the event of technical difficulties. Alnisa and this provider participated in today's telepsychological service.   This provider conducted a brief check-in. Glanda reported a friend's son passed away. She also stated she met with Dr. Reggy Eye, noting she was diagnosed with ADHD. She explained she was not surprised, but acknowledged she feels "angry" at herself for not pursuing the evaluation sooner. Associated thoughts and feelings were briefly processed. She was receptive to a referral for psychiatric/therapeutic services. Regarding eating habits, she noted,"Things are going well" and she continues to experience weight loss. While there have been "a couple moments" of engagement in emotional eating behaviors, she noted a continued reduction. A plan was developed to help Loucille cope with emotional eating in the future using learned skills. She wrote down the following plan: use non-dominant hand when eating to reduce overeating; focus on self-compassion; focus on hydration; be prepared with snacks congruent to the meal  plan; pause to ask questions when triggered to eat (e.g., Am I really hungry?, Is there something bothering me?, and Will I feel better if I eat?); and engage in discussed coping strategies after going through the aforementioned questions. Overall, Nealie was receptive to today's appointment as evidenced by openness to sharing, responsiveness to feedback, and willingness to continue engaging in learned skills.  Mental Status Examination:  Appearance: neat Behavior: appropriate to circumstances Mood: sad Affect: mood congruent Speech: WNL Eye Contact: appropriate Psychomotor Activity: WNL Gait: unable to assess Thought Process: linear, logical, and goal directed and no evidence or endorsement of suicidal, homicidal, and self-harm ideation, plan and intent  Thought Content/Perception: no hallucinations, delusions, bizarre thinking or behavior endorsed or observed Orientation: AAOx4 Memory/Concentration: intact Insight: good Judgment: good  Interventions:  Conducted a brief chart review Provided empathic reflections and validation Provided positive reinforcement Employed supportive psychotherapy interventions to facilitate reduced distress and to improve coping skills with identified stressors Reviewed learned skills  DSM-5 Diagnosis(es):  F50.89 Other Specified Feeding or Eating Disorder, Emotional Eating Behaviors, F33.1 Major Depressive Disorder, Recurrent Episode, Moderate, With Anxious Distress, and F90.9 Unspecified Attention-Deficit/Hyperactivity Disorder   Treatment Goal & Progress: During the initial appointment with this provider, the following treatment goal was established: increase coping skills. Vonnie demonstrated progress in her goal as evidenced by increased awareness of hunger patterns, increased awareness of triggers for emotional eating behaviors, and reduction in emotional eating behaviors . Lorina also continues to demonstrate willingness to engage in learned  skill(s).  Plan: As previously planned, today was Anyjah's last appointment with this provider. She acknowledged understanding that she may request a follow-up appointment with this provider in the future as long as she is still established with the clinic. No further follow-up planned by this provider.

## 2023-06-07 ENCOUNTER — Encounter (INDEPENDENT_AMBULATORY_CARE_PROVIDER_SITE_OTHER): Payer: Self-pay | Admitting: Family Medicine

## 2023-06-07 ENCOUNTER — Ambulatory Visit (INDEPENDENT_AMBULATORY_CARE_PROVIDER_SITE_OTHER): Payer: 59 | Admitting: Family Medicine

## 2023-06-07 VITALS — BP 103/69 | HR 98 | Temp 98.4°F | Ht 64.0 in | Wt 181.0 lb

## 2023-06-07 DIAGNOSIS — Z6831 Body mass index (BMI) 31.0-31.9, adult: Secondary | ICD-10-CM

## 2023-06-07 DIAGNOSIS — F3289 Other specified depressive episodes: Secondary | ICD-10-CM | POA: Diagnosis not present

## 2023-06-07 DIAGNOSIS — E88819 Insulin resistance, unspecified: Secondary | ICD-10-CM

## 2023-06-07 DIAGNOSIS — R7303 Prediabetes: Secondary | ICD-10-CM

## 2023-06-07 MED ORDER — BUPROPION HCL ER (XL) 300 MG PO TB24
300.0000 mg | ORAL_TABLET | Freq: Every day | ORAL | 0 refills | Status: DC
Start: 2023-06-07 — End: 2023-09-02

## 2023-06-07 MED ORDER — METFORMIN HCL ER 500 MG PO TB24
500.0000 mg | ORAL_TABLET | Freq: Every day | ORAL | 0 refills | Status: DC
Start: 2023-06-07 — End: 2023-09-28

## 2023-06-07 NOTE — Progress Notes (Signed)
Office: [phone]  /  Fax: [phone]  WEIGHT SUMMARY AND BIOMETRICS  Starting Date: 12/09/22  Starting Weight: 208lb   Weight Lost Since Last Visit: 5lb   Vitals Temp: 98.4 F (36.9 C) BP: 103/69 Pulse Rate: 98 SpO2: 96 %   Body Composition  Body Fat %: 40.5 % Fat Mass (lbs): 73.4 lbs Muscle Mass (lbs): 102.4 lbs Total Body Water (lbs): 75.4 lbs Visceral Fat Rating : 10     HPI  Chief Complaint: OBESITY  Gloria Smith is here to discuss her progress with her obesity treatment plan. She is on the the Category 3 Plan and states she is following her eating plan approximately 75 % of the time. She states she is moving more and getting more steps in each day.    Interval History:  Since last office visit she is down 5 lb  She has had to eat out more but is still making good choices She has a net weight loss of 27 lb in the past 6 mos This is a 12.9% total body weight loss without use of antiobesity medications She has been working 2 jobs making it harder to plan meals and snacks Her husband has been helping with cooking dinner She likes a Restaurant manager, fast food bar at night She is getting in 2 fruit servings per day Knee pain has improved She has been more active but is not formally exercising but plans to go back to [employer] with a co- worker She has done CBT with Dr Dewaine Conger  Pharmacotherapy: Wellbutrin XL 300 mg daily and metformin XR 500 mg daily  PHYSICAL EXAM:  Blood pressure 103/69, pulse 98, temperature 98.4 F (36.9 C), height 5\' 4"  (1.626 m), weight 181 lb (82.1 kg), SpO2 96%. Body mass index is 31.07 kg/m.  General: She is overweight, cooperative, alert, well developed, and in no acute distress. PSYCH: Has normal mood, affect and thought process.   Lungs: Normal breathing effort, no conversational dyspnea.   ASSESSMENT AND PLAN  TREATMENT PLAN FOR OBESITY:  Recommended Dietary Goals  Mitchell is currently in the action stage of change. As such, her goal is to  continue weight management plan. She has agreed to the Category 3 Plan.  Behavioral Intervention  We discussed the following Behavioral Modification Strategies today: increasing lean protein intake, decreasing simple carbohydrates , increasing vegetables, increasing lower glycemic fruits, increasing water intake, keeping healthy foods at home, continue to practice mindfulness when eating, planning for success, and better snacking choices.  Additional resources provided today: NA  Recommended Physical Activity Goals  Gloria Smith has been advised to work up to 150 minutes of moderate intensity aerobic activity a week and strengthening exercises 2-3 times per week for cardiovascular health, weight loss maintenance and preservation of muscle mass.   She has agreed to Start aerobic activity with a goal of 150 minutes a week at moderate intensity.   Pharmacotherapy changes for the treatment of obesity: None  ASSOCIATED CONDITIONS ADDRESSED TODAY  Pre-diabetes Assessment & Plan: Lab Results  Component Value Date   HGBA1C 5.6 03/31/2023   A1c has normalized with healthy dietary changes, regular exercise and weight loss.  She has lost 12.9% total body weight in the past 6 months of medically supervised weight management.  She is doing well on metformin XR 500 mg once daily with food.  She denies adverse side effects.  Continue current medications and healthy lifestyle changes.  Increase gym workouts to 3 times a week.  Orders: -     metFORMIN  HCl ER; Take 1 tablet (500 mg total) by mouth daily with breakfast.  Dispense: 90 tablet; Refill: 0  Other depression with emotional eating Assessment & Plan: She has completed cognitive behavioral therapy with Dr. Dewaine Conger and recently went through ADHD testing with psychology.  She does plan to establish care with psychiatry at [facility].  She reports high stress levels.  Continue to work on stress reduction, adequate sleep at night and packing healthy snacks  including protein shakes, protein bars and fresh fruit while working 2 jobs.  Orders: -     buPROPion HCl ER (XL); Take 1 tablet (300 mg total) by mouth daily.  Dispense: 90 tablet; Refill: 0  Morbid obesity (HCC) with starting BMI 35  BMI 31.0-31.9,adult  Insulin resistance Assessment & Plan: Fasting insulin elevated at 35.27 November 2022.  She is doing well on metformin XR 500 mg once daily with food.  She is doing well with weight reduction, increased regular physical activity and a low sugar/lower starch diet.  She denies adverse side effects from metformin.  Recheck fasting insulin at follow-up visit in 6 weeks.  Continue metformin XR 500 mg once daily with food.  Increase gym workouts to 3 times a week.       She was informed of the importance of frequent follow up visits to maximize her success with intensive lifestyle modifications for her multiple health conditions.   ATTESTASTION STATEMENTS:  Reviewed by clinician on day of visit: allergies, medications, problem list, medical history, surgical history, family history, social history, and previous encounter notes pertinent to obesity diagnosis.   I have personally spent 30 minutes total time today in preparation, patient care, nutritional counseling and documentation for this visit, including the following: review of clinical lab tests; review of medical tests/procedures/services.      [name], DO DABFM, DABOM [facility] [address] [place], [place] [address] [phone]

## 2023-06-07 NOTE — Assessment & Plan Note (Signed)
She has completed cognitive behavioral therapy with Dr. Dewaine Conger and recently went through ADHD testing with psychology.  She does plan to establish care with psychiatry at [facility].  She reports high stress levels.  Continue to work on stress reduction, adequate sleep at night and packing healthy snacks including protein shakes, protein bars and fresh fruit while working 2 jobs.

## 2023-06-07 NOTE — Assessment & Plan Note (Addendum)
Fasting insulin elevated at 35.27 November 2022.  She is doing well on metformin XR 500 mg once daily with food.  She is doing well with weight reduction, increased regular physical activity and a low sugar/lower starch diet.  She denies adverse side effects from metformin.  Recheck fasting insulin at follow-up visit in 6 weeks.  Continue metformin XR 500 mg once daily with food.  Increase gym workouts to 3 times a week.

## 2023-06-07 NOTE — Assessment & Plan Note (Signed)
Lab Results  Component Value Date   HGBA1C 5.6 03/31/2023   A1c has normalized with healthy dietary changes, regular exercise and weight loss.  She has lost 12.9% total body weight in the past 6 months of medically supervised weight management.  She is doing well on metformin XR 500 mg once daily with food.  She denies adverse side effects.  Continue current medications and healthy lifestyle changes.  Increase gym workouts to 3 times a week.

## 2023-08-15 ENCOUNTER — Other Ambulatory Visit (INDEPENDENT_AMBULATORY_CARE_PROVIDER_SITE_OTHER): Payer: Self-pay | Admitting: Family Medicine

## 2023-08-15 DIAGNOSIS — F3289 Other specified depressive episodes: Secondary | ICD-10-CM

## 2023-08-31 NOTE — Progress Notes (Signed)
ACUTE VISIT Chief Complaint  Patient presents with   Foot Pain    X a month, unsure if she injured it, left foot.    Annual Exam    Colonoscopy referral   HPI: Gloria Smith is a 52 y.o. female with a PMHx significant for left breast cancer, HLD, HTN, and depression, among others, who is here today complaining of foot pain.  She was last seen on 07/20/2022.  Foot pain:  She complains of right foot pain around the first metatarsophalangeal joint for about a month. She believes she hit it on a rock while tubing, but does not specifically remember the incident.  She describes the pain as a constant soreness that worsens when walking. She rates it as a 7/10 but most of the time it is milder. She says the pain has not improved or worsened much since it began. She denies any wound or scratch on the skin, swelling, redness, or unusual numbness or tingling.  She is not taking anything OTC.   She mentions she has been to physical therapy for arthritis in her knees.  -She reports she has not been going to the weight loss clinic as much because she has lost significant weight since Adderall was added.  She is planning on arranging a follow-up appointment.  -She mentions she has had some recent constipation. She attributes this to not eating sufficient vegetables.  Last bowel movement 2 days ago. She denies abdominal pain, blood in the stool. Last colonoscopy in 08/2020, 3 years follow-up was recommended, she has not received appointment information.  Hx of elevated alkaline phosphatase, she denies nausea, vomiting, jaundice. According to patient, this problem has been followed by her oncologist and etiology has not been identified. Lab Results  Component Value Date   ALT 21 03/31/2023   AST 18 03/31/2023   GGT 31 10/24/2017   ALKPHOS 209 (H) 03/31/2023   BILITOT 0.2 03/31/2023   Abdominal MRI done in 09/2017: 1. 8 by 4 mm lesion with signal and enhancement characteristics most  typical for a small hemangioma in segment IVb of the liver. No MRI findings of suspicion for hepatic metastatic disease. 2. Small left adrenal adenoma. 3. Scattered descending colon diverticula.  DEXA in 07/2022: Normal.  -Since her last visit she has follow-up with her oncology regularly, has one more appointment in 11/2023.  She has also been following with psychiatrist, diagnosed with ADHD and currently on Adderall.  She mentioned that she is no longer on Wellbutrin XL or paroxetine.  -Shortness of breath on exertion has improved.  She says she is not needing to use her albuterol inhaler very frequently for SOB,cough, and wheezing.  Albuterol inhaler helps with symptoms when needed.  No associated CP or palpitations.  Hyperlipidemia: She is on nonpharmacologic treatment. Lab Results  Component Value Date   CHOL 204 (H) 07/20/2022   HDL 55.20 07/20/2022   LDLCALC 130 (H) 07/20/2022   TRIG 93.0 07/20/2022   CHOLHDL 4 07/20/2022   Review of Systems  Constitutional:  Negative for activity change, appetite change and fever.  HENT:  Negative for mouth sores and sore throat.   Cardiovascular:  Negative for leg swelling.  Skin:  Negative for rash.  Neurological:  Negative for speech difficulty and headaches.  See other pertinent positives and negatives in HPI.  Current Outpatient Medications on File Prior to Visit  Medication Sig Dispense Refill   amphetamine-dextroamphetamine (ADDERALL XR) 30 MG 24 hr capsule Take 30 mg by mouth daily.  aspirin EC 81 MG tablet Take 81 mg by mouth daily. Swallow whole.     exemestane (AROMASIN) 25 MG tablet Take 1 tablet by mouth daily after breakfast. 90 tablet 0   Ipratropium-Albuterol (COMBIVENT RESPIMAT) 20-100 MCG/ACT AERS respimat Inhale 1 puff into the lungs every 6 (six) hours as needed for wheezing. 4 g 2   metFORMIN (GLUCOPHAGE-XR) 500 MG 24 hr tablet Take 1 tablet (500 mg total) by mouth daily with breakfast. 90 tablet 0   No current  facility-administered medications on file prior to visit.   Past Medical History:  Diagnosis Date   Anxiety    Arthritis    Back pain    Bilateral swelling of feet and ankles    Constipation    Depression    Headache    ocular migraines - very rare   Heartburn    Joint pain    Kidney infection    Malignant neoplasm of upper-outer quadrant of left breast in female, estrogen receptor negative (HCC) 09/10/2016   pt states that cancer is on left breast, not right   Pre-diabetes    SOB (shortness of breath)    Urinary frequency    Vitamin D deficiency    No Known Allergies  Social History   Socioeconomic History   Marital status: Married    Spouse name: Not on file   Number of children: 1   Years of education: Not on file   Highest education level: Bachelor's degree (e.g., BA, AB, BS)  Occupational History   Not on file  Tobacco Use   Smoking status: Former    Current packs/day: 0.00    Average packs/day: 0.5 packs/day for 30.0 years (15.0 ttl pk-yrs)    Types: Cigarettes    Start date: 08/22/1986    Quit date: 08/22/2016    Years since quitting: 7.0   Smokeless tobacco: Never  Vaping Use   Vaping status: Never Used  Substance and Sexual Activity   Alcohol use: Yes    Alcohol/week: 1.0 standard drink of alcohol    Types: 1 Glasses of wine per week    Comment: 6 drinks wine or beer a week    Drug use: No   Sexual activity: Yes    Birth control/protection: Surgical  Other Topics Concern   Not on file  Social History Narrative   Not on file   Social Determinants of Health   Financial Resource Strain: Low Risk  (09/01/2023)   Overall Financial Resource Strain (CARDIA)    Difficulty of Paying Living Expenses: Not hard at all  Food Insecurity: No Food Insecurity (09/01/2023)   Hunger Vital Sign    Worried About Running Out of Food in the Last Year: Never true    Ran Out of Food in the Last Year: Never true  Transportation Needs: No Transportation Needs  (09/01/2023)   PRAPARE - Administrator, Civil Service (Medical): No    Lack of Transportation (Non-Medical): No  Physical Activity: Insufficiently Active (09/01/2023)   Exercise Vital Sign    Days of Exercise per Week: 3 days    Minutes of Exercise per Session: 30 min  Stress: Stress Concern Present (09/01/2023)   [employer] of Occupational Health - Occupational Stress Questionnaire    Feeling of Stress : Very much  Social Connections: Moderately Isolated (09/01/2023)   Social Connection and Isolation Panel [NHANES]    Frequency of Communication with Friends and Family: More than three times a week    Frequency  of Social Gatherings with Friends and Family: Three times a week    Attends Religious Services: Never    Active Member of Clubs or Organizations: Yes    Attends Banker Meetings: 1 to 4 times per year    Marital Status: Separated   Vitals:   09/02/23 0729  BP: 120/78  Pulse: 85  Resp: 16  Temp: 98 F (36.7 C)  SpO2: 98%   Body mass index is 29.91 kg/m.  Physical Exam Vitals and nursing note reviewed.  Constitutional:      General: She is not in acute distress.    Appearance: She is well-developed.  HENT:     Head: Normocephalic and atraumatic.  Eyes:     Conjunctiva/sclera: Conjunctivae normal.  Cardiovascular:     Rate and Rhythm: Normal rate and regular rhythm.     Pulses:          Dorsalis pedis pulses are 2+ on the right side and 2+ on the left side.     Heart sounds: No murmur heard. Pulmonary:     Effort: Pulmonary effort is normal. No respiratory distress.     Breath sounds: Normal breath sounds.  Abdominal:     Palpations: Abdomen is soft. There is no hepatomegaly or mass.     Tenderness: There is no abdominal tenderness.  Musculoskeletal:     Right lower leg: No edema.     Left lower leg: No edema.     Right foot: Normal range of motion and normal capillary refill. Bunion present. Normal pulse.     Left foot:  Bunion present.       Feet:  Skin:    General: Skin is warm.     Findings: No erythema or rash.  Neurological:     General: No focal deficit present.     Mental Status: She is alert and oriented to person, place, and time.     Cranial Nerves: No cranial nerve deficit.     Gait: Gait normal.  Psychiatric:        Mood and Affect: Mood and affect normal.   ASSESSMENT AND PLAN:  Gloria Smith was seen today for foot pain.   Right foot pain We discussed possible etiologies, most likely OA. She is concerned about possible bony abnormality, so for the x-ray ordered today. She has taken meloxicam in the past, it has been well-tolerated.  Recommend short course, meloxicam 15 mg daily for 10 days then as needed for up to 30 days. Some side effects discussed.  If problem is persistent, she can arrange appointment with podiatrist.  -     DG Foot Complete Right; Future -     Meloxicam; Daily for 10 days and the prn for 30 days.  Dispense: 30 tablet; Refill: 0  Hyperlipidemia, unspecified hyperlipidemia type Assessment & Plan: Continue nonpharmacologic treatment. She is planning on rescheduling appointment at [facility], where most likely she will have lipid panel done.  Polyp of colon, unspecified part of colon, unspecified type Fannie Knee for colonoscopy, referral placed.  -     Ambulatory referral to Gastroenterology  Elevated alkaline phosphatase level Assessment & Plan: Problem has been going on for years, around 09/2017, and reported as otherwise stable. At her oncologist's she is having CMP periodically. DEXA in 07/2022 and abdominal MRI in 09/2017 (1.8 x 4 mm hemangioma).  Need for influenza vaccination -     Flu vaccine trivalent PF, 6mos and older(Flulaval,Afluria,Fluarix,Fluzone)  Need for shingles vaccine -  Varicella-zoster vaccine IM   Return in about 1 year (around 09/01/2024) for CPE.  I, Suanne Marker, acting as a scribe for Macai Sisneros [place],  MD., have documented all relevant documentation on the behalf of Gilles Trimpe [place], MD, as directed by  Meg Niemeier [place], MD while in the presence of Mattison Stuckey [place], MD.   I, Hobart Marte [place], MD, have reviewed all documentation for this visit. The documentation on 09/02/23 for the exam, diagnosis, procedures, and orders are all accurate and complete.  Malajah Oceguera G. [place], MD  [facility]. [place] office.

## 2023-09-02 ENCOUNTER — Ambulatory Visit: Payer: 59 | Admitting: Family Medicine

## 2023-09-02 ENCOUNTER — Ambulatory Visit: Payer: 59

## 2023-09-02 ENCOUNTER — Encounter: Payer: Self-pay | Admitting: Family Medicine

## 2023-09-02 VITALS — BP 120/78 | HR 85 | Temp 98.0°F | Resp 16 | Ht 64.0 in | Wt 174.2 lb

## 2023-09-02 DIAGNOSIS — R748 Abnormal levels of other serum enzymes: Secondary | ICD-10-CM | POA: Insufficient documentation

## 2023-09-02 DIAGNOSIS — E785 Hyperlipidemia, unspecified: Secondary | ICD-10-CM | POA: Diagnosis not present

## 2023-09-02 DIAGNOSIS — K635 Polyp of colon: Secondary | ICD-10-CM | POA: Diagnosis not present

## 2023-09-02 DIAGNOSIS — M79671 Pain in right foot: Secondary | ICD-10-CM

## 2023-09-02 DIAGNOSIS — Z23 Encounter for immunization: Secondary | ICD-10-CM

## 2023-09-02 MED ORDER — MELOXICAM 15 MG PO TABS
ORAL_TABLET | ORAL | 0 refills | Status: DC
Start: 2023-09-02 — End: 2024-08-31

## 2023-09-02 NOTE — Assessment & Plan Note (Signed)
Problem has been going on for years, around 09/2017, and reported as otherwise stable. At her oncologist's she is having CMP periodically. DEXA in 07/2022 and abdominal MRI in 09/2017 (1.8 x 4 mm hemangioma).

## 2023-09-02 NOTE — Patient Instructions (Addendum)
A few things to remember from today's visit:  Hyperlipidemia, unspecified hyperlipidemia type  Polyp of colon, unspecified part of colon, unspecified type - Plan: Ambulatory referral to Gastroenterology  Right foot pain - Plan: DG Foot Complete Right, meloxicam (MOBIC) 15 MG tablet  Elevated alkaline phosphatase level No labs today, most likely you will have blood work done at [facility]. Foot pain could be arthritis, take Meloxicam for 10 days then as needed for no more than 30 days. If persistent you can arrange an appt with podiatrist.  Do not use My Chart to request refills or for acute issues that need immediate attention. If you send a my chart message, it may take a few days to be addressed, specially if I am not in the office.  Please be sure medication list is accurate. If a new problem present, please set up appointment sooner than planned today.

## 2023-09-02 NOTE — Assessment & Plan Note (Signed)
Continue nonpharmacologic treatment. She is planning on rescheduling appointment at [facility] clinic, where most likely she will have lipid panel done.

## 2023-09-18 ENCOUNTER — Encounter: Payer: Self-pay | Admitting: Family Medicine

## 2023-09-28 ENCOUNTER — Encounter: Payer: Self-pay | Admitting: Family Medicine

## 2023-09-28 ENCOUNTER — Ambulatory Visit: Payer: 59 | Admitting: Family Medicine

## 2023-09-28 VITALS — BP 115/76 | HR 83 | Temp 98.6°F | Ht 64.0 in | Wt 169.0 lb

## 2023-09-28 DIAGNOSIS — R7303 Prediabetes: Secondary | ICD-10-CM

## 2023-09-28 DIAGNOSIS — Z6829 Body mass index (BMI) 29.0-29.9, adult: Secondary | ICD-10-CM

## 2023-09-28 DIAGNOSIS — F909 Attention-deficit hyperactivity disorder, unspecified type: Secondary | ICD-10-CM | POA: Diagnosis not present

## 2023-09-28 NOTE — Progress Notes (Signed)
Office: [phone]  /  Fax: [phone]  WEIGHT SUMMARY AND BIOMETRICS  Starting Date: 12/09/22  Starting Weight: 208lb   Weight Lost Since Last Visit: 12lb   Vitals Temp: 98.6 F (37 C) BP: 115/76 Pulse Rate: 83 SpO2: 96 %   Body Composition  Body Fat %: 39.7 % Fat Mass (lbs): 67.2 lbs Muscle Mass (lbs): 96.8 lbs Total Body Water (lbs): 73.4 lbs Visceral Fat Rating : 9    HPI  Chief Complaint: OBESITY  Towanda is here to discuss her progress with her obesity treatment plan. She is on the category 2 plan and states she is following her eating plan approximately 50 % of the time. She states she is walking the dog more.   Interval History:  Since last office visit she is down 12 lb She did start Adderall XR 30 mg for ADHD between visits She is having breakfast before her AM Adderall dose She is bringing healthy food to work and has to remember to eat lunch Her husband moved out and she feels OK with preparing meals She has been Door Dashing more at night She has a puppy now and plans to walk the dog more and go the gym in the morning  Pharmacotherapy: none   PHYSICAL EXAM:  Blood pressure 115/76, pulse 83, temperature 98.6 F (37 C), height 5\' 4"  (1.626 m), weight 169 lb (76.7 kg), SpO2 96%. Body mass index is 29.01 kg/m.  General: She is overweight, cooperative, alert, well developed, and in no acute distress. PSYCH: Has normal mood, affect and thought process.   Lungs: Normal breathing effort, no conversational dyspnea.   ASSESSMENT AND PLAN  TREATMENT PLAN FOR OBESITY:  Recommended Dietary Goals  Sandee is currently in the action stage of change. As such, her goal is to continue weight management plan. She has agreed to practicing portion control and making smarter food choices, such as increasing vegetables and decreasing simple carbohydrates. - reduce frequency of meals out / Door Dash, using [employer] to help plan out healthier dinners - bring  healthy snacks/ lunch to work  [employer]  We discussed the following Behavioral Modification Strategies today: increasing lean protein intake to established goals, increasing fiber rich foods, avoiding skipping meals, keeping healthy foods at home, continue to practice mindfulness when eating, planning for success, and continue to work on maintaining a reduced calorie state, getting the recommended amount of protein, incorporating whole foods, making healthy choices, staying well hydrated and practicing mindfulness when eating..  Additional resources provided today: NA  Recommended Physical Activity Goals  Carletta has been advised to work up to 150 minutes of moderate intensity aerobic activity a week and strengthening exercises 2-3 times per week for cardiovascular health, weight loss maintenance and preservation of muscle mass.   She has agreed to [employer] strengthening exercises with a goal of 2-3 sessions a week  and Start aerobic activity with a goal of 150 minutes a week at moderate intensity.  - she agrees to increasing dog walking and AM gym workouts with a goal of 4 days/ wk  Pharmacotherapy changes for the treatment of obesity: discontinue metformin  ASSOCIATED CONDITIONS ADDRESSED TODAY  Pre-diabetes Assessment & Plan: Lab Results  Component Value Date   HGBA1C 5.6 03/31/2023   Improving.  She has seen resolution of prediabetes with an 18% TBW loss in the past 10 mos of medically supervised weight management.  She has done well reducing portion sizes, partly due to Adderall use, reducing intake of added  sugar and controlling emotional eating with her recent divorce.  We discussed the importance of regular exercise with a plan to add in walking the dog and gym workouts for total of 4 to 5 days a week.  Will keep her off metformin.   Morbid obesity (HCC) with starting BMI 35  BMI 29.0-29.9,adult  Attention deficit hyperactivity disorder (ADHD), unspecified ADHD  type Assessment & Plan: She is being seen by psychology currently on Adderall XR 30 mg once daily.  She has been able to come off all other mood medication.  She feels emotionally stable since her husband moved out.  She is able to work on self-care, stress reduction and planning her meals better.  She has some reduced appetite from Adderall which can lead to meal skipping in the afternoon.  She is doing a good job of bringing healthy food to work and reminding herself to eat lunch.  Continue to work on organizing thought process around meals, meal planning and avoiding eating on the run.       She was informed of the importance of frequent follow up visits to maximize her success with intensive lifestyle modifications for her multiple health conditions.   ATTESTASTION STATEMENTS:  Reviewed by clinician on day of visit: allergies, medications, problem list, medical history, surgical history, family history, social history, and previous encounter notes pertinent to obesity diagnosis.   I have personally spent 30 minutes total time today in preparation, patient care, nutritional counseling and documentation for this visit, including the following: review of clinical lab tests; review of medical tests/procedures/services.      [name], DO DABFM, DABOM [facility] [address] [place], [place] [address] [phone]

## 2023-09-28 NOTE — Assessment & Plan Note (Signed)
She is being seen by psychology currently on Adderall XR 30 mg once daily.  She has been able to come off all other mood medication.  She feels emotionally stable since her husband moved out.  She is able to work on self-care, stress reduction and planning her meals better.  She has some reduced appetite from Adderall which can lead to meal skipping in the afternoon.  She is doing a good job of bringing healthy food to work and reminding herself to eat lunch.  Continue to work on organizing thought process around meals, meal planning and avoiding eating on the run.

## 2023-09-28 NOTE — Assessment & Plan Note (Signed)
Lab Results  Component Value Date   HGBA1C 5.6 03/31/2023   Improving.  She has seen resolution of prediabetes with an 18% TBW loss in the past 10 mos of medically supervised weight management.  She has done well reducing portion sizes, partly due to Adderall use, reducing intake of added sugar and controlling emotional eating with her recent divorce.  We discussed the importance of regular exercise with a plan to add in walking the dog and gym workouts for total of 4 to 5 days a week.  Will keep her off metformin.

## 2023-09-29 ENCOUNTER — Encounter: Payer: Self-pay | Admitting: Podiatry

## 2023-09-29 ENCOUNTER — Ambulatory Visit (INDEPENDENT_AMBULATORY_CARE_PROVIDER_SITE_OTHER): Payer: 59

## 2023-09-29 ENCOUNTER — Other Ambulatory Visit: Payer: Self-pay | Admitting: Podiatry

## 2023-09-29 ENCOUNTER — Ambulatory Visit: Payer: 59 | Admitting: Podiatry

## 2023-09-29 DIAGNOSIS — R7303 Prediabetes: Secondary | ICD-10-CM | POA: Diagnosis not present

## 2023-09-29 DIAGNOSIS — M79671 Pain in right foot: Secondary | ICD-10-CM

## 2023-09-29 DIAGNOSIS — G629 Polyneuropathy, unspecified: Secondary | ICD-10-CM | POA: Diagnosis not present

## 2023-09-29 DIAGNOSIS — M21611 Bunion of right foot: Secondary | ICD-10-CM

## 2023-09-29 NOTE — Progress Notes (Signed)
  Subjective:  Patient ID: Gloria Smith, female    DOB: 1971-07-04,   MRN: [id number]  No chief complaint on file.   52 y.o. female presents for concern of bilateral foot pain. Relates she was seen by PCP and relates some issues with her feet. X-rays were taken and wanted to discuss results. Relates for a while now cannot recall exactly she has had some burning tingling and numbness in her feet. Relates more recently she started to get more aching and stabbing pains most in the right foot around her great toe.  Denies any current treatments. Relates her A1c has been elevated in the past but looks like the highest was 6.0  Patient is pre-diabetic and last A1c was  Lab Results  Component Value Date   HGBA1C 5.6 03/31/2023   .   PCP:  [place], Betty G, MD    . Denies any other pedal complaints. Denies n/v/f/c.   Past Medical History:  Diagnosis Date   Anxiety    Arthritis    Back pain    Bilateral swelling of feet and ankles    Constipation    Depression    Headache    ocular migraines - very rare   Heartburn    Joint pain    Kidney infection    Malignant neoplasm of upper-outer quadrant of left breast in female, estrogen receptor negative (HCC) 09/10/2016   pt states that cancer is on left breast, not right   Pre-diabetes    SOB (shortness of breath)    Urinary frequency    Vitamin D deficiency     Objective:  Physical Exam: Vascular: DP/PT pulses 2/4 bilateral. CFT <3 seconds. Normal hair growth on digits. No edema.  Skin. No lacerations or abrasions bilateral feet.  Musculoskeletal: MMT 5/5 bilateral lower extremities in DF, PF, Inversion and Eversion. Deceased ROM in DF of ankle joint. Mild HAV deformity with minimal pain to palpation. No pain with ROM of the first MPJ. No hypermobility noted. No pain to palpation elsewhere around the foot.  Neurological: Sensation intact to light touch. Protective sensation intact.   Assessment:   1. Bunion, right   2.  Pre-diabetes   3. Peripheral polyneuropathy      Plan:  Patient was evaluated and treated and all questions answered. -Xrays reviewed from PCP. Non weightbearing but mild HAV deformity noted unable to measure. There is some spurring at inferior heel and mild degenerative changes in the midfoot.  -Discussed HAV and treatment options;conservative and surgical management; risks, benefits, alternatives discussed. All patient's questions answered. -Discussed padding and wide shoe gear.   -Recommend continue with good supportive shoes and inserts.  -Discussed surgical options may be necessary in the future.  -Discussed and educated patient on diabetic foot care, especially with  regards to the vascular, neurological and musculoskeletal systems.  -Stressed the importance of good glycemic control and the detriment of not  controlling glucose levels in relation to the foot. -Discussed supportive shoes at all times and checking feet regularly.  -Answered all patient questions -Patient to return  in 1 year for DM foot check.  -Patient advised to call the office if any problems or questions arise in the meantime.   Louann Sjogren, DPM

## 2023-10-10 ENCOUNTER — Encounter: Payer: Self-pay | Admitting: Gastroenterology

## 2023-10-26 ENCOUNTER — Encounter: Payer: Self-pay | Admitting: Hematology

## 2023-11-02 ENCOUNTER — Other Ambulatory Visit: Payer: Self-pay

## 2023-11-02 ENCOUNTER — Ambulatory Visit (AMBULATORY_SURGERY_CENTER): Payer: 59

## 2023-11-02 VITALS — Ht 64.0 in | Wt 170.0 lb

## 2023-11-02 DIAGNOSIS — Z1211 Encounter for screening for malignant neoplasm of colon: Secondary | ICD-10-CM

## 2023-11-02 MED ORDER — SUTAB 1479-225-188 MG PO TABS: 1.0000 | ORAL_TABLET | ORAL | 0 refills | Status: AC

## 2023-11-02 NOTE — Progress Notes (Signed)
Denies allergies to eggs or soy products. Denies complication of anesthesia or sedation. Denies use of weight loss medication. Denies use of O2.   Emmi instructions given for colonoscopy.   Patient was insistent that she try the tablets since she has trouble with nausea drinking a prep. Prep was changed to Sutab per patients request.

## 2023-11-03 ENCOUNTER — Encounter: Payer: Self-pay | Admitting: Gastroenterology

## 2023-11-07 ENCOUNTER — Ambulatory Visit: Payer: 59 | Admitting: Family Medicine

## 2023-11-07 ENCOUNTER — Encounter: Payer: Self-pay | Admitting: Family Medicine

## 2023-11-07 VITALS — BP 114/74 | HR 98 | Temp 98.0°F | Ht 64.0 in | Wt 170.0 lb

## 2023-11-07 DIAGNOSIS — E88819 Insulin resistance, unspecified: Secondary | ICD-10-CM

## 2023-11-07 DIAGNOSIS — R748 Abnormal levels of other serum enzymes: Secondary | ICD-10-CM

## 2023-11-07 DIAGNOSIS — R5383 Other fatigue: Secondary | ICD-10-CM

## 2023-11-07 DIAGNOSIS — F3289 Other specified depressive episodes: Secondary | ICD-10-CM | POA: Diagnosis not present

## 2023-11-07 DIAGNOSIS — Z13220 Encounter for screening for lipoid disorders: Secondary | ICD-10-CM

## 2023-11-07 DIAGNOSIS — R7303 Prediabetes: Secondary | ICD-10-CM

## 2023-11-07 DIAGNOSIS — Z6829 Body mass index (BMI) 29.0-29.9, adult: Secondary | ICD-10-CM

## 2023-11-07 NOTE — Assessment & Plan Note (Signed)
She is seeing Dr Dewaine Conger for CBT and is working on self care and mindful eating She has recently divorced and feels less stress now  Continue to focus on self care, meal planning, proper sleep and hydration Plan to add in some home exercise 2-3 x a week

## 2023-11-07 NOTE — Assessment & Plan Note (Signed)
She is UTD with DEXA screening, at risk for bone loss due to use of Aromasin post breast cancer She still has her gall bladder She denies RUQ pain or post prandial nausea  Repeat CMP today with a GGT level

## 2023-11-07 NOTE — Assessment & Plan Note (Signed)
Last fasting insulin was elevated but she has lost 18% of her TBW loss and greatly reduced sugar intake and has been more physically active.  She has been off of metformin since the summer  Repeat labs today and continue to work on healthy lifestyle changes

## 2023-11-07 NOTE — Progress Notes (Signed)
Office: [phone]  /  Fax: [phone]  WEIGHT SUMMARY AND BIOMETRICS  Starting Date: 12/09/22  Starting Weight: 208   Weight Lost Since Last Visit: 0lb   Vitals Temp: 98 F (36.7 C) BP: 114/74 Pulse Rate: 98 SpO2: 97 %   Body Composition  Body Fat %: 37.6 % Fat Mass (lbs): 64 lbs Muscle Mass (lbs): 100.6 lbs Total Body Water (lbs): 70.6 lbs Visceral Fat Rating : 9    HPI  Chief Complaint: OBESITY  Gloria Smith is here to discuss her progress with her obesity treatment plan. She is on the the Category 2 Plan and states she is following her eating plan approximately 75 % of the time. She states she is exercising 0 minutes 0 times per week.  Interval History:  Since last office visit she is up 1 lb This gives her a net weight loss of 38 lb in the past 11 mos of medically supervised weight management This is an 18% TBW loss without use of AOM She is back up 3.8 lb of muscle mass and down 3.2 lb of body fat She has been more active at work and at home but hasn't figured out how to be more consistent with formal workouts She hasn't been very hungry due to use of Adderall XR and is feeling hungrier at night She denies intake of sweets or snack foods but is eating the rest of her dinner around 8 pm She had a quiet Thanksgiving She is enjoying living alone with her puppy She remains off of Metformin since A1c improved  Pharmacotherapy: none  PHYSICAL EXAM:  Blood pressure 114/74, pulse 98, temperature 98 F (36.7 C), height 5\' 4"  (1.626 m), weight 170 lb (77.1 kg), SpO2 97%. Body mass index is 29.18 kg/m.  General: She is healthy appearing, cooperative, alert, well developed, and in no acute distress. PSYCH: Has normal mood, affect and thought process.   Lungs: Normal breathing effort, no conversational dyspnea.   ASSESSMENT AND PLAN  TREATMENT PLAN FOR OBESITY:  Recommended Dietary Goals  Omaya is currently in the action stage of change. As such, her goal is  to continue weight management plan. She has agreed to practicing portion control and making smarter food choices, such as increasing vegetables and decreasing simple carbohydrates.  Behavioral Intervention  We discussed the following Behavioral Modification Strategies today: increasing lean protein intake to established goals, decreasing simple carbohydrates , increasing vegetables, increasing lower glycemic fruits, increasing water intake , work on meal planning and preparation, keeping healthy foods at home, identifying sources and decreasing liquid calories, and continue to work on maintaining a reduced calorie state, getting the recommended amount of protein, incorporating whole foods, making healthy choices, staying well hydrated and practicing mindfulness when eating.. Bring high protein/ high fiber foods to work and eat on a schedule even though Adderrall makes her not want to eat  Swap out NAKED juice (brought in today with 35 g of natural sugar per bottle for a Suja green juice with 9 g of natural sugar per serving and no added sugar) or opt for a whole fruit serving  OK to finish dinner at 7:30-8 pm but avoid snacking/ sweets late at night  Additional resources provided today: NA  Recommended Physical Activity Goals  Damini has been advised to work up to 150 minutes of moderate intensity aerobic activity a week and strengthening exercises 2-3 times per week for cardiovascular health, weight loss maintenance and preservation of muscle mass.   She has agreed to  Start strengthening exercises with a goal of 2-3 sessions a week  and track daily steps  Pharmacotherapy changes for the treatment of obesity: none  ASSOCIATED CONDITIONS ADDRESSED TODAY  Elevated alkaline phosphatase level Assessment & Plan: She is UTD with DEXA screening, at risk for bone loss due to use of Aromasin post breast cancer She still has her gall bladder She denies RUQ pain or post prandial nausea  Repeat CMP  today with a GGT level  Orders: -     Comprehensive metabolic panel -     Gamma GT  Other fatigue -     CBC -     Vitamin B12  Pre-diabetes -     Hemoglobin A1c  Insulin resistance Assessment & Plan: Last fasting insulin was elevated but she has lost 18% of her TBW loss and greatly reduced sugar intake and has been more physically active.  She has been off of metformin since the summer  Repeat labs today and continue to work on healthy lifestyle changes  Orders: -     Insulin, random  Screening, lipid -     Lipid panel  Morbid obesity (HCC) with starting BMI 35  BMI 29.0-29.9,adult  Other depression with emotional eating Assessment & Plan: She is seeing Dr Dewaine Conger for CBT and is working on self care and mindful eating She has recently divorced and feels less stress now  Continue to focus on self care, meal planning, proper sleep and hydration Plan to add in some home exercise 2-3 x a week       She was informed of the importance of frequent follow up visits to maximize her success with intensive lifestyle modifications for her multiple health conditions.   ATTESTASTION STATEMENTS:  Reviewed by clinician on day of visit: allergies, medications, problem list, medical history, surgical history, family history, social history, and previous encounter notes pertinent to obesity diagnosis.   I have personally spent 30 minutes total time today in preparation, patient care, nutritional counseling and documentation for this visit, including the following: review of clinical lab tests; review of medical tests/procedures/services.      [name], DO DABFM, DABOM [facility] [address] [place], [place] [address] [phone]

## 2023-11-08 LAB — LIPID PANEL
Chol/HDL Ratio: 4.1 {ratio} (ref 0.0–4.4)
Cholesterol, Total: 192 mg/dL (ref 100–199)
HDL: 47 mg/dL (ref >39)
LDL Chol Calc (NIH): 121 mg/dL — ABNORMAL HIGH (ref 0–99)
Triglycerides: 137 mg/dL (ref 0–149)
VLDL Cholesterol Cal: 24 mg/dL (ref 5–40)

## 2023-11-09 LAB — COMPREHENSIVE METABOLIC PANEL
ALT: 24 [IU]/L (ref 0–32)
AST: 15 [IU]/L (ref 0–40)
Albumin: 4.2 g/dL (ref 3.8–4.9)
Alkaline Phosphatase: 189 [IU]/L — ABNORMAL HIGH (ref 44–121)
BUN/Creatinine Ratio: 16 (ref 9–23)
BUN: 15 mg/dL (ref 6–24)
Bilirubin Total: 0.3 mg/dL (ref 0.0–1.2)
CO2: 24 mmol/L (ref 20–29)
Calcium: 9.8 mg/dL (ref 8.7–10.2)
Chloride: 103 mmol/L (ref 96–106)
Creatinine, Ser: 0.96 mg/dL (ref 0.57–1.00)
Globulin, Total: 2.5 g/dL (ref 1.5–4.5)
Glucose: 102 mg/dL — ABNORMAL HIGH (ref 70–99)
Potassium: 4.6 mmol/L (ref 3.5–5.2)
Sodium: 142 mmol/L (ref 134–144)
Total Protein: 6.7 g/dL (ref 6.0–8.5)
eGFR: 71 mL/min/{1.73_m2} (ref >59)

## 2023-11-09 LAB — VITAMIN B12: Vitamin B-12: 997 pg/mL (ref 232–1245)

## 2023-11-09 LAB — GAMMA GT: GGT: 37 [IU]/L (ref 0–60)

## 2023-11-09 LAB — CBC
Hematocrit: 44.9 % (ref 34.0–46.6)
Hemoglobin: 14.5 g/dL (ref 11.1–15.9)
MCH: 28.2 pg (ref 26.6–33.0)
MCHC: 32.3 g/dL (ref 31.5–35.7)
MCV: 87 fL (ref 79–97)
Platelets: 503 10*3/uL — ABNORMAL HIGH (ref 150–450)
RBC: 5.15 x10E6/uL (ref 3.77–5.28)
RDW: 11.9 % (ref 11.7–15.4)
WBC: 9.9 10*3/uL (ref 3.4–10.8)

## 2023-11-09 LAB — HEMOGLOBIN A1C
Est. average glucose Bld gHb Est-mCnc: 117 mg/dL
Hgb A1c MFr Bld: 5.7 % — ABNORMAL HIGH (ref 4.8–5.6)

## 2023-11-09 LAB — INSULIN, RANDOM: INSULIN: 22.9 u[IU]/mL (ref 2.6–24.9)

## 2023-11-10 ENCOUNTER — Ambulatory Visit: Payer: 59

## 2023-11-10 DIAGNOSIS — Z170 Estrogen receptor positive status [ER+]: Secondary | ICD-10-CM

## 2023-11-10 DIAGNOSIS — C50412 Malignant neoplasm of upper-outer quadrant of left female breast: Secondary | ICD-10-CM | POA: Diagnosis not present

## 2023-11-11 ENCOUNTER — Encounter: Payer: Self-pay | Admitting: Gastroenterology

## 2023-11-11 ENCOUNTER — Ambulatory Visit (AMBULATORY_SURGERY_CENTER): Payer: 59 | Admitting: Gastroenterology

## 2023-11-11 VITALS — BP 134/67 | HR 73 | Temp 98.0°F | Resp 16 | Ht 64.0 in | Wt 170.0 lb

## 2023-11-11 DIAGNOSIS — Z860101 Personal history of adenomatous and serrated colon polyps: Secondary | ICD-10-CM | POA: Diagnosis not present

## 2023-11-11 DIAGNOSIS — Z1211 Encounter for screening for malignant neoplasm of colon: Secondary | ICD-10-CM

## 2023-11-11 DIAGNOSIS — K5730 Diverticulosis of large intestine without perforation or abscess without bleeding: Secondary | ICD-10-CM | POA: Diagnosis not present

## 2023-11-11 DIAGNOSIS — K641 Second degree hemorrhoids: Secondary | ICD-10-CM

## 2023-11-11 DIAGNOSIS — Z860100 Personal history of colon polyps, unspecified: Secondary | ICD-10-CM

## 2023-11-11 DIAGNOSIS — K644 Residual hemorrhoidal skin tags: Secondary | ICD-10-CM | POA: Diagnosis not present

## 2023-11-11 MED ORDER — SODIUM CHLORIDE 0.9 % IV SOLN: 500.0000 mL | Freq: Once | INTRAVENOUS | Status: DC

## 2023-11-11 NOTE — Progress Notes (Signed)
To pacu, VSS. Report to [email or web address].tb 

## 2023-11-11 NOTE — Op Note (Signed)
[facility] Patient Name: Gloria Smith Procedure Date: 11/11/2023 11:00 AM MRN: [id number] Endoscopist: Doristine Locks , MD, [id number] Age: 52 Referring MD:  Date of Birth: [birth year] Gender: Female Account #: [id number] Procedure:                Colonoscopy Indications:              Surveillance: Personal history of adenomatous                            polyps on last colonoscopy 3 years ago                           Last colonoscopy was 08/2020 and notable for 6                            subcentimeter polyps with pathology showing tubular                            adenoma x 3 and sessile serrated polyp x 3, along                            with left-sided diverticulosis and internal                            hemorrhoids. Medicines:                Monitored Anesthesia Care Procedure:                Pre-Anesthesia Assessment:                           - Prior to the procedure, a History and Physical                            was performed, and patient medications and                            allergies were reviewed. The patient's tolerance of                            previous anesthesia was also reviewed. The risks                            and benefits of the procedure and the sedation                            options and risks were discussed with the patient.                            All questions were answered, and informed consent                            was obtained. Prior Anticoagulants: The patient has  taken no anticoagulant or antiplatelet agents. ASA                            Grade Assessment: II - A patient with mild systemic                            disease. After reviewing the risks and benefits,                            the patient was deemed in satisfactory condition to                            undergo the procedure.                           After obtaining informed consent, the colonoscope                             was passed under direct vision. Throughout the                            procedure, the patient's blood pressure, pulse, and                            oxygen saturations were monitored continuously. The                            Olympus Scope SN: [id number] was introduced through                            the anus and advanced to the the terminal ileum.                            The colonoscopy was performed without difficulty.                            The patient tolerated the procedure well. The                            quality of the bowel preparation was good. The                            terminal ileum, ileocecal valve, appendiceal                            orifice, and rectum were photographed. Scope In: 11:14:27 AM Scope Out: 11:25:04 AM Scope Withdrawal Time: 0 hours 7 minutes 55 seconds  Total Procedure Duration: 0 hours 10 minutes 37 seconds  Findings:                 Hemorrhoids were found on perianal exam.                           Multiple large-mouthed and small-mouthed  diverticula were found in the sigmoid colon,                            descending colon and ascending colon.                           Non-bleeding internal hemorrhoids were found during                            retroflexion. The hemorrhoids were small and Grade                            II (internal hemorrhoids that prolapse but reduce                            spontaneously).                           The terminal ileum appeared normal. Complications:            No immediate complications. Estimated Blood Loss:     Estimated blood loss: none. Impression:               - Hemorrhoids found on perianal exam.                           - Diverticulosis in the sigmoid colon, in the                            descending colon and in the ascending colon.                           - The remainder of the colon was otherwise normal                             appearing.                           - Non-bleeding internal hemorrhoids.                           - The examined portion of the ileum was normal.                           - No specimens collected. Recommendation:           - Patient has a contact number available for                            emergencies. The signs and symptoms of potential                            delayed complications were discussed with the                            patient. Return to normal activities tomorrow.  Written discharge instructions were provided to the                            patient.                           - Resume previous diet.                           - Continue present medications.                           - Repeat colonoscopy in 5 years for ongoing                            surveillance given prior history of adenomatous and                            sessile serrated polyps.                           - Return to GI office PRN. Doristine Locks, MD 11/11/2023 11:34:42 AM

## 2023-11-11 NOTE — Patient Instructions (Signed)
Thank you for letting us take care of your healthcare needs today. Please see handouts given to you on Diverticulosis and Hemorrhoids.    YOU HAD AN ENDOSCOPIC PROCEDURE TODAY AT THE [facility]:   Refer to the procedure report that was given to you for any specific questions about what was found during the examination.  If the procedure report does not answer your questions, please call your gastroenterologist to clarify.  If you requested that your care partner not be given the details of your procedure findings, then the procedure report has been included in a sealed envelope for you to review at your convenience later.  YOU SHOULD EXPECT: Some feelings of bloating in the abdomen. Passage of more gas than usual.  Walking can help get rid of the air that was put into your GI tract during the procedure and reduce the bloating. If you had a lower endoscopy (such as a colonoscopy or flexible sigmoidoscopy) you may notice spotting of blood in your stool or on the toilet paper. If you underwent a bowel prep for your procedure, you may not have a normal bowel movement for a few days.  Please Note:  You might notice some irritation and congestion in your nose or some drainage.  This is from the oxygen used during your procedure.  There is no need for concern and it should clear up in a day or so.  SYMPTOMS TO REPORT IMMEDIATELY:  Following lower endoscopy (colonoscopy or flexible sigmoidoscopy):  Excessive amounts of blood in the stool  Significant tenderness or worsening of abdominal pains  Swelling of the abdomen that is new, acute  Fever of 100F or higher   For urgent or emergent issues, a gastroenterologist can be reached at any hour by calling [phone]. Do not use MyChart messaging for urgent concerns.    DIET:  We do recommend a small meal at first, but then you may proceed to your regular diet.  Drink plenty of fluids but you should avoid alcoholic beverages for 24  hours.  ACTIVITY:  You should plan to take it easy for the rest of today and you should NOT DRIVE or use heavy machinery until tomorrow (because of the sedation medicines used during the test).    FOLLOW UP: Our staff will call the number listed on your records the next business day following your procedure.  We will call around 7:15- 8:00 am to check on you and address any questions or concerns that you may have regarding the information given to you following your procedure. If we do not reach you, we will leave a message.     If any biopsies were taken you will be contacted by phone or by letter within the next 1-3 weeks.  Please call us at [phone] if you have not heard about the biopsies in 3 weeks.    SIGNATURES/CONFIDENTIALITY: You and/or your care partner have signed paperwork which will be entered into your electronic medical record.  These signatures attest to the fact that that the information above on your After Visit Summary has been reviewed and is understood.  Full responsibility of the confidentiality of this discharge information lies with you and/or your care-partner. 

## 2023-11-11 NOTE — Progress Notes (Signed)
GASTROENTEROLOGY PROCEDURE H&P NOTE   Primary Care Physician: [place], Betty G, MD    Reason for Procedure:  Colon polyp surveillance  Plan:    Colonoscopy  Patient is appropriate for endoscopic procedure(s) in the ambulatory ([facility]) setting.  The nature of the procedure, as well as the risks, benefits, and alternatives were carefully and thoroughly reviewed with the patient. Ample time for discussion and questions allowed. The patient understood, was satisfied, and agreed to proceed.     HPI: Gloria Smith is a 52 y.o. female who presents for colonoscopy for ongoing colon polyp surveillance and colon cancer screening.  No active GI symptoms.  No known family history of colon cancer or related malignancy.  Patient is otherwise without complaints or active issues today.  Last colonoscopy was 08/2020 and notable for 6 subcentimeter polyps with pathology showing tubular adenoma x 3 and sessile serrated polyp x 3, along with left-sided diverticulosis and internal hemorrhoids.  Past Medical History:  Diagnosis Date   Allergy    Anxiety    Arthritis    Back pain    Bilateral swelling of feet and ankles    Constipation    Depression    Headache    ocular migraines - very rare   Heartburn    Joint pain    Kidney infection    Malignant neoplasm of upper-outer quadrant of left breast in female, estrogen receptor negative (HCC) 09/10/2016   pt states that cancer is on left breast, not right   Pre-diabetes    SOB (shortness of breath)    Urinary frequency    Vitamin D deficiency     Past Surgical History:  Procedure Laterality Date   BREAST LUMPECTOMY     BREAST LUMPECTOMY WITH RADIOACTIVE SEED AND SENTINEL LYMPH NODE BIOPSY Left 10/05/2016   Procedure: LEFT BREAST RADIOACTIVE SEED GUIDED LUMPECTOMY, LEFT AXILLARY RADIOACTIVE SEED GUIDED NODE EXCISION, LEFT AXILLARY SENTINEL LYMPH NODE BIOPSY(LEFT TARGETED AXILLARY DISSECTION);  Surgeon: Emelia Loron, MD;  Location:  [facility] OR;  Service: General;  Laterality: Left;   DENTAL SURGERY     DILATION AND CURETTAGE OF UTERUS     LAPAROSCOPIC BILATERAL SALPINGO OOPHERECTOMY Bilateral 04/14/2017   Procedure: LAPAROSCOPIC BILATERAL SALPINGO OOPHORECTOMY;  Surgeon: Noland Fordyce, MD;  Location: [facility] ORS;  Service: Gynecology;  Laterality: Bilateral;   right knee surgery at age of 46       Prior to Admission medications   Medication Sig Start Date End Date Taking? Authorizing Provider  amphetamine-dextroamphetamine (ADDERALL XR) 30 MG 24 hr capsule Take 30 mg by mouth daily.   Yes [provider]  aspirin EC 81 MG tablet Take 81 mg by mouth daily. Swallow whole.   Yes [provider]  exemestane (AROMASIN) 25 MG tablet Take 1 tablet by mouth daily after breakfast. 05/18/23  Yes Pollyann Samples, NP  meloxicam (MOBIC) 15 MG tablet Daily for 10 days and the prn for 30 days. 09/02/23  Yes [place], Betty G, MD  Sodium Sulfate-Mag Sulfate-KCl (SUTAB) [phone] MG TABS Take 1 tablet by mouth as directed for 1 day. 11/10/23 11/11/23 Yes Mansouraty, Netty Starring., MD  Ipratropium-Albuterol (COMBIVENT RESPIMAT) 20-100 MCG/ACT AERS respimat Inhale 1 puff into the lungs every 6 (six) hours as needed for wheezing. 07/20/22   [place], Betty G, MD  traZODone (DESYREL) 100 MG tablet Take 100 mg by mouth at bedtime as needed. 11/08/23   [provider]    Current Outpatient Medications  Medication Sig Dispense Refill   amphetamine-dextroamphetamine (  ADDERALL XR) 30 MG 24 hr capsule Take 30 mg by mouth daily.     aspirin EC 81 MG tablet Take 81 mg by mouth daily. Swallow whole.     exemestane (AROMASIN) 25 MG tablet Take 1 tablet by mouth daily after breakfast. 90 tablet 0   meloxicam (MOBIC) 15 MG tablet Daily for 10 days and the prn for 30 days. 30 tablet 0   Sodium Sulfate-Mag Sulfate-KCl (SUTAB) [phone] MG TABS Take 1 tablet by mouth as directed for 1 day. 24 tablet 0   Ipratropium-Albuterol (COMBIVENT  RESPIMAT) 20-100 MCG/ACT AERS respimat Inhale 1 puff into the lungs every 6 (six) hours as needed for wheezing. 4 g 2   traZODone (DESYREL) 100 MG tablet Take 100 mg by mouth at bedtime as needed.     Current Facility-Administered Medications  Medication Dose Route Frequency Provider Last Rate Last Admin   0.9 %  sodium chloride infusion  500 mL Intravenous Once Gayathri Futrell V, DO        Allergies as of 11/11/2023   (No Known Allergies)    Family History  Problem Relation Age of Onset   Thyroid disease Mother    Other Mother        hx precancerous skin finding removed from eyelid at 75-71   Arthritis Mother    Hypertension Father    Breast cancer Sister    Heart attack Maternal Grandmother 15   Early death Maternal Grandmother    Heart disease Maternal Grandmother    Stroke Maternal Grandfather    Cancer Paternal Grandmother    Esophageal cancer Paternal Grandmother    Heart attack Paternal Grandfather 25   Early death Paternal Grandfather    Breast cancer Cousin        paternal 1st cousin, once-removed; dx. 45s   Other Other 25       hx of precancerous finding on abnormal pap smear   Cancer Other        maternal great aunt (MGM's sister) d. early 52s; spinal cancer, but this may have been a secondary cancer w/ NOS primary   Cancer Other        maternal great grandmother (MGM's mother) d. 33s w/ NOS cancer   Cancer Other        paternal great uncle (PGM's brother) d. 53s w/ NOS cancer   Colon cancer Neg Hx    Rectal cancer Neg Hx    Stomach cancer Neg Hx     Social History   Socioeconomic History   Marital status: Married    Spouse name: Not on file   Number of children: 1   Years of education: Not on file   Highest education level: Bachelor's degree (e.g., BA, AB, BS)  Occupational History   Not on file  Tobacco Use   Smoking status: Former    Current packs/day: 0.00    Average packs/day: 0.5 packs/day for 30.0 years (15.0 ttl pk-yrs)    Types: Cigarettes     Start date: 08/22/1986    Quit date: 08/22/2016    Years since quitting: 7.2   Smokeless tobacco: Never  Vaping Use   Vaping status: Never Used  Substance and Sexual Activity   Alcohol use: Yes    Alcohol/week: 1.0 standard drink of alcohol    Types: 1 Glasses of wine per week    Comment: 6 drinks wine or beer a week    Drug use: No   Sexual activity: Yes    Birth  control/protection: Surgical  Other Topics Concern   Not on file  Social History Narrative   Not on file   Social Drivers of Health   Financial Resource Strain: Low Risk  (09/01/2023)   Overall Financial Resource Strain (CARDIA)    Difficulty of Paying Living Expenses: Not hard at all  Food Insecurity: No Food Insecurity (09/01/2023)   Hunger Vital Sign    Worried About Running Out of Food in the Last Year: Never true    Ran Out of Food in the Last Year: Never true  Transportation Needs: No Transportation Needs (09/01/2023)   PRAPARE - Administrator, Civil Service (Medical): No    Lack of Transportation (Non-Medical): No  Physical Activity: Insufficiently Active (09/01/2023)   Exercise Vital Sign    Days of Exercise per Week: 3 days    Minutes of Exercise per Session: 30 min  Stress: Stress Concern Present (09/01/2023)   [employer] of Occupational Health - Occupational Stress Questionnaire    Feeling of Stress : Very much  Social Connections: Moderately Isolated (09/01/2023)   Social Connection and Isolation Panel [NHANES]    Frequency of Communication with Friends and Family: More than three times a week    Frequency of Social Gatherings with Friends and Family: Three times a week    Attends Religious Services: Never    Active Member of Clubs or Organizations: Yes    Attends Banker Meetings: 1 to 4 times per year    Marital Status: Separated  Intimate Partner Violence: Not At Risk (06/02/2021)   Humiliation, Afraid, Rape, and Kick questionnaire    Fear of Current or  Ex-Partner: No    Emotionally Abused: No    Physically Abused: No    Sexually Abused: No    Physical Exam: Vital signs in last 24 hours: @BP  (!) 149/74   Pulse 88   Temp 98 F (36.7 C) (Temporal)   Ht 5\' 4"  (1.626 m)   Wt 170 lb (77.1 kg)   LMP  (LMP Unknown) Comment: oopherectomy  SpO2 99%   BMI 29.18 kg/m  GEN: NAD EYE: Sclerae anicteric ENT: MMM CV: Non-tachycardic Pulm: CTA b/l GI: Soft, NT/ND NEURO:  Alert & Oriented x 3   Doristine Locks, DO [facility]   11/11/2023 11:07 AM

## 2023-11-11 NOTE — Progress Notes (Signed)
Pt's states no medical or surgical changes since previsit or office visit. 

## 2023-11-15 ENCOUNTER — Telehealth: Payer: Self-pay

## 2023-11-15 NOTE — Telephone Encounter (Signed)
  Follow up Call-     11/11/2023   10:34 AM  Call back number  Post procedure Call Back phone  # [phone]  Permission to leave phone message Yes     Patient questions:  Do you have a fever, pain , or abdominal swelling? No. Pain Score  0 *  Have you tolerated food without any problems? Yes.    Have you been able to return to your normal activities? Yes.    Do you have any questions about your discharge instructions: Diet   No. Medications  No. Follow up visit  No.  Do you have questions or concerns about your Care? No.  Actions: * If pain score is 4 or above: No action needed, pain <4.

## 2023-11-30 ENCOUNTER — Encounter: Payer: Self-pay | Admitting: Hematology

## 2023-12-07 ENCOUNTER — Ambulatory Visit: Payer: 59 | Admitting: Family Medicine

## 2023-12-14 ENCOUNTER — Ambulatory Visit (INDEPENDENT_AMBULATORY_CARE_PROVIDER_SITE_OTHER): Payer: BC Managed Care – PPO | Admitting: Family Medicine

## 2023-12-14 ENCOUNTER — Encounter: Payer: Self-pay | Admitting: Hematology

## 2023-12-14 ENCOUNTER — Encounter: Payer: Self-pay | Admitting: Family Medicine

## 2023-12-14 VITALS — BP 115/80 | HR 87 | Temp 98.2°F | Ht 64.0 in | Wt 166.0 lb

## 2023-12-14 DIAGNOSIS — E785 Hyperlipidemia, unspecified: Secondary | ICD-10-CM | POA: Diagnosis not present

## 2023-12-14 DIAGNOSIS — E559 Vitamin D deficiency, unspecified: Secondary | ICD-10-CM

## 2023-12-14 DIAGNOSIS — R748 Abnormal levels of other serum enzymes: Secondary | ICD-10-CM | POA: Diagnosis not present

## 2023-12-14 DIAGNOSIS — R7303 Prediabetes: Secondary | ICD-10-CM

## 2023-12-14 DIAGNOSIS — Z6828 Body mass index (BMI) 28.0-28.9, adult: Secondary | ICD-10-CM

## 2023-12-14 DIAGNOSIS — E663 Overweight: Secondary | ICD-10-CM

## 2023-12-14 NOTE — Progress Notes (Signed)
Office: [phone]  /  Fax: [phone]  WEIGHT SUMMARY AND BIOMETRICS  Starting Date: 12/09/22  Starting Weight: 208lb   Weight Lost Since Last Visit: 4lb   Vitals Temp: 98.2 F (36.8 C) BP: 115/80 Pulse Rate: 87 SpO2: 99 %   Body Composition  Body Fat %: 38.5 % Fat Mass (lbs): 64 lbs Muscle Mass (lbs): 96.8 lbs Total Body Water (lbs): 70.2 lbs Visceral Fat Rating : 9   HPI  Chief Complaint: OBESITY  Gloria Smith is here to discuss her progress with her obesity treatment plan. She is on the the Category 2 Plan and states she is following her eating plan approximately 50 % of the time. She states she is exercising 0 minutes 0 times per week.   Interval History:  Since last office visit she is down 4 lb She has lost 42 lb in the past year This is a 20% TBW loss in one year without use of AOMs She has been working on meal prep  for dinners and has been consistent with breakfast and lunches She feels full at the end of her meals Hunger and cravings are under good control She has not been walking a lot due to the weather She has been working longer hours She would like to get down to 145 lb  Pharmacotherapy: none  PHYSICAL EXAM:  Blood pressure 115/80, pulse 87, temperature 98.2 F (36.8 C), height 5\' 4"  (1.626 m), weight 166 lb (75.3 kg), SpO2 99%. Body mass index is 28.49 kg/m.  General: She is overweight, cooperative, alert, well developed, and in no acute distress. PSYCH: Has normal mood, affect and thought process.   Lungs: Normal breathing effort, no conversational dyspnea.   ASSESSMENT AND PLAN  TREATMENT PLAN FOR OBESITY:  Recommended Dietary Goals  Gloria Smith is currently in the action stage of change. As such, her goal is to continue weight management plan. She has agreed to practicing portion control and making smarter food choices, such as increasing vegetables and decreasing simple carbohydrates.  Behavioral Intervention  We discussed the  following Behavioral Modification Strategies today: increasing lean protein intake to established goals, increasing fiber rich foods, increasing water intake , work on meal planning and preparation, keeping healthy foods at home, practice mindfulness eating and understand the difference between hunger signals and cravings, work on managing stress, creating time for self-care and relaxation, planning for success, and continue to work on maintaining a reduced calorie state, getting the recommended amount of protein, incorporating whole foods, making healthy choices, staying well hydrated and practicing mindfulness when eating..  Additional resources provided today: NA  Recommended Physical Activity Goals  Gloria Smith has been advised to work up to 150 minutes of moderate intensity aerobic activity a week and strengthening exercises 2-3 times per week for cardiovascular health, weight loss maintenance and preservation of muscle mass.   She has agreed to Increase physical activity in their day and reduce sedentary time (increase NEAT). and Increase the intensity, frequency or duration of aerobic exercises    Pharmacotherapy changes for the treatment of obesity: none  ASSOCIATED CONDITIONS ADDRESSED TODAY  Hyperlipidemia, unspecified hyperlipidemia type Assessment & Plan: Reviewed lab results with patient.  FLP has been fairly stable even with a 20% TBW loss.  She is not on any lipid lowering medication and has no previous hx of cardiovascular disease.  She is post- menopausal.  F/u with PCP and continue to work on healthy eating and regular exercise  The 10-year ASCVD risk score (Arnett DK, et  al., 2019) is: 1.5%   Values used to calculate the score:     Age: 53 years     Sex: Female     Is Non-Hispanic African American: No     Diabetic: No     Tobacco smoker: No     Systolic Blood Pressure: 115 mmHg     Is BP treated: No     HDL Cholesterol: 47 mg/dL     Total Cholesterol: 192 mg/dL     Overweight (BMI 28.4-13.9) Assessment & Plan: Overall, patient has done well, down 42 lb in 1 year of medically supervised weight management which is a 20% TBW loss. She feels good about her progress She has been through a divorce and is no longer emotionally eating Hunger and cravings under good control with eating on a schedule, lean protein and fiber with meals, mindful eating and limiting sweets and ultra processed foods. She plans to increase her walking time with work and weather permitting to lose ~20 more pounds.     BMI 28.0-28.9,adult  Pre-diabetes Assessment & Plan: Lab Results  Component Value Date   HGBA1C 5.7 (H) 11/07/2023   Reviewed labs from last visit She remains in the prediabetic range with IR She has made progress in one year She has not been on metformin  Continue to work on a low sugar diet, limiting excess starches Add in 30-45 min of walking 4-5 days/ wk by [place].   Elevated alkaline phosphatase level Assessment & Plan: Lab Results  Component Value Date   ALKPHOS 189 (H) 11/07/2023   ALKPHOS 163 (H) 11/24/2017    Reviewed labs with patient Alk phos remains elevated with normal AST and ALT levels No previous hx of NAFLD GGT normal indicating potential bone loss DEXA reviewed from 07/2022 which was normal  Results cc 'd to Dr Mosetta Putt for upcoming heme/ onc appt   Vitamin D deficiency Assessment & Plan: Last vitamin D Lab Results  Component Value Date   VD25OH 74.1 03/31/2023   She has been off of RX vitamin D weekly  She may continue on a MVI once daily       She was informed of the importance of frequent follow up visits to maximize her success with intensive lifestyle modifications for her multiple health conditions.   ATTESTASTION STATEMENTS:  Reviewed by clinician on day of visit: allergies, medications, problem list, medical history, surgical history, family history, social history, and previous encounter notes pertinent to  obesity diagnosis.   I have personally spent 30 minutes total time today in preparation, patient care, nutritional counseling and documentation for this visit, including the following: review of clinical lab tests; review of medical tests/procedures/services.      [name], DO DABFM, DABOM [facility] [address] [place], [place] [address] [phone]

## 2023-12-14 NOTE — Assessment & Plan Note (Signed)
Lab Results  Component Value Date   ALKPHOS 189 (H) 11/07/2023   ALKPHOS 163 (H) 11/24/2017    Reviewed labs with patient Alk phos remains elevated with normal AST and ALT levels No previous hx of NAFLD GGT normal indicating potential bone loss DEXA reviewed from 07/2022 which was normal  Results cc 'd to Dr Mosetta Putt for upcoming heme/ onc appt

## 2023-12-14 NOTE — Assessment & Plan Note (Signed)
Overall, patient has done well, down 42 lb in 1 year of medically supervised weight management which is a 20% TBW loss. She feels good about her progress She has been through a divorce and is no longer emotionally eating Hunger and cravings under good control with eating on a schedule, lean protein and fiber with meals, mindful eating and limiting sweets and ultra processed foods. She plans to increase her walking time with work and weather permitting to lose ~20 more pounds.

## 2023-12-14 NOTE — Assessment & Plan Note (Signed)
Lab Results  Component Value Date   HGBA1C 5.7 (H) 11/07/2023   Reviewed labs from last visit She remains in the prediabetic range with IR She has made progress in one year She has not been on metformin  Continue to work on a low sugar diet, limiting excess starches Add in 30-45 min of walking 4-5 days/ wk by [place].

## 2023-12-14 NOTE — Assessment & Plan Note (Signed)
Last vitamin D Lab Results  Component Value Date   VD25OH 74.1 03/31/2023   She has been off of RX vitamin D weekly  She may continue on a MVI once daily

## 2023-12-14 NOTE — Assessment & Plan Note (Addendum)
Reviewed lab results with patient.  FLP has been fairly stable even with a 20% TBW loss.  She is not on any lipid lowering medication and has no previous hx of cardiovascular disease.  She is post- menopausal.  F/u with PCP and continue to work on healthy eating and regular exercise  The 10-year ASCVD risk score (Arnett DK, et al., 2019) is: 1.5%   Values used to calculate the score:     Age: 53 years     Sex: Female     Is Non-Hispanic African American: No     Diabetic: No     Tobacco smoker: No     Systolic Blood Pressure: 115 mmHg     Is BP treated: No     HDL Cholesterol: 47 mg/dL     Total Cholesterol: 192 mg/dL

## 2023-12-28 ENCOUNTER — Other Ambulatory Visit: Payer: Self-pay

## 2023-12-28 DIAGNOSIS — C50412 Malignant neoplasm of upper-outer quadrant of left female breast: Secondary | ICD-10-CM

## 2023-12-29 ENCOUNTER — Inpatient Hospital Stay: Payer: BC Managed Care – PPO | Attending: Nurse Practitioner

## 2023-12-29 ENCOUNTER — Inpatient Hospital Stay (HOSPITAL_BASED_OUTPATIENT_CLINIC_OR_DEPARTMENT_OTHER): Payer: BC Managed Care – PPO | Admitting: Nurse Practitioner

## 2023-12-29 ENCOUNTER — Encounter: Payer: Self-pay | Admitting: Nurse Practitioner

## 2023-12-29 VITALS — BP 114/59 | HR 89 | Temp 97.5°F | Resp 16 | Wt 170.2 lb

## 2023-12-29 DIAGNOSIS — D75839 Thrombocytosis, unspecified: Secondary | ICD-10-CM | POA: Insufficient documentation

## 2023-12-29 DIAGNOSIS — Z90722 Acquired absence of ovaries, bilateral: Secondary | ICD-10-CM | POA: Diagnosis not present

## 2023-12-29 DIAGNOSIS — Z9079 Acquired absence of other genital organ(s): Secondary | ICD-10-CM | POA: Insufficient documentation

## 2023-12-29 DIAGNOSIS — F32A Depression, unspecified: Secondary | ICD-10-CM | POA: Diagnosis not present

## 2023-12-29 DIAGNOSIS — C50412 Malignant neoplasm of upper-outer quadrant of left female breast: Secondary | ICD-10-CM

## 2023-12-29 DIAGNOSIS — C50411 Malignant neoplasm of upper-outer quadrant of right female breast: Secondary | ICD-10-CM | POA: Insufficient documentation

## 2023-12-29 DIAGNOSIS — Z170 Estrogen receptor positive status [ER+]: Secondary | ICD-10-CM | POA: Insufficient documentation

## 2023-12-29 DIAGNOSIS — Z79811 Long term (current) use of aromatase inhibitors: Secondary | ICD-10-CM | POA: Diagnosis not present

## 2023-12-29 DIAGNOSIS — D751 Secondary polycythemia: Secondary | ICD-10-CM | POA: Insufficient documentation

## 2023-12-29 DIAGNOSIS — F909 Attention-deficit hyperactivity disorder, unspecified type: Secondary | ICD-10-CM | POA: Insufficient documentation

## 2023-12-29 DIAGNOSIS — Z923 Personal history of irradiation: Secondary | ICD-10-CM | POA: Diagnosis not present

## 2023-12-29 DIAGNOSIS — Z1732 Human epidermal growth factor receptor 2 negative status: Secondary | ICD-10-CM | POA: Diagnosis not present

## 2023-12-29 LAB — CBC WITH DIFFERENTIAL (CANCER CENTER ONLY)
Abs Immature Granulocytes: 0.02 10*3/uL (ref 0.00–0.07)
Basophils Absolute: 0.1 10*3/uL (ref 0.0–0.1)
Basophils Relative: 1 %
Eosinophils Absolute: 0.2 10*3/uL (ref 0.0–0.5)
Eosinophils Relative: 2 %
HCT: 42.7 % (ref 36.0–46.0)
Hemoglobin: 13.9 g/dL (ref 12.0–15.0)
Immature Granulocytes: 0 %
Lymphocytes Relative: 27 %
Lymphs Abs: 2.3 10*3/uL (ref 0.7–4.0)
MCH: 28.1 pg (ref 26.0–34.0)
MCHC: 32.6 g/dL (ref 30.0–36.0)
MCV: 86.3 fL (ref 80.0–100.0)
Monocytes Absolute: 0.7 10*3/uL (ref 0.1–1.0)
Monocytes Relative: 8 %
Neutro Abs: 5.1 10*3/uL (ref 1.7–7.7)
Neutrophils Relative %: 62 %
Platelet Count: 447 10*3/uL — ABNORMAL HIGH (ref 150–400)
RBC: 4.95 MIL/uL (ref 3.87–5.11)
RDW: 12.9 % (ref 11.5–15.5)
WBC Count: 8.4 10*3/uL (ref 4.0–10.5)
nRBC: 0 % (ref 0.0–0.2)

## 2023-12-29 LAB — CMP (CANCER CENTER ONLY)
ALT: 21 U/L (ref 0–44)
AST: 15 U/L (ref 15–41)
Albumin: 4.1 g/dL (ref 3.5–5.0)
Alkaline Phosphatase: 148 U/L — ABNORMAL HIGH (ref 38–126)
Anion gap: 6 (ref 5–15)
BUN: 18 mg/dL (ref 6–20)
CO2: 30 mmol/L (ref 22–32)
Calcium: 9.4 mg/dL (ref 8.9–10.3)
Chloride: 105 mmol/L (ref 98–111)
Creatinine: 0.9 mg/dL (ref 0.44–1.00)
GFR, Estimated: >60 mL/min (ref >60)
Glucose, Bld: 121 mg/dL — ABNORMAL HIGH (ref 70–99)
Potassium: 4.5 mmol/L (ref 3.5–5.1)
Sodium: 141 mmol/L (ref 135–145)
Total Bilirubin: 0.4 mg/dL (ref 0.0–1.2)
Total Protein: 7.2 g/dL (ref 6.5–8.1)

## 2023-12-29 NOTE — Progress Notes (Signed)
 Patient Care Team: Jordan, Betty G, MD as PCP - General (Family Medicine) Dewey Rush, MD as Consulting Physician (Radiation Oncology) Lanny Callander, MD as Consulting Physician (Hematology) Ebbie Cough, MD as Consulting Physician (General Surgery) Crawford, Morna Pickle, NP as Nurse Practitioner (Hematology and Oncology)   CHIEF COMPLAINT: Follow up left breast cancer   Oncology History Overview Note  Breast cancer of upper-outer quadrant of left female breast [facility])   Staging form: Breast, AJCC 7th Edition   - Clinical stage from 09/06/2016: Stage IIA (T1b, N1, M0) - Signed by Callander Lanny, MD on 09/15/2016   - Pathologic stage from 10/05/2016: Stage IIA (T1b, N1a, cM0) - Signed by Callander Lanny, MD on 10/24/2016    Breast cancer of upper-outer quadrant of left female breast (HCC)  09/06/2016 Initial Diagnosis   Malignant neoplasm of upper-outer quadrant of right female breast (HCC)   09/06/2016 Initial Biopsy   Left breast 3:00 position showed invasive ductal carcinoma and DCIS, left axillary lymph node biopsy showed metastatic carcinoma with extracapsular extension   09/06/2016 Receptors her2   Breast tumor ER 100% positive, PR 100% positive, HER-2 negative, Ki-67 5%   09/06/2016 Mammogram   Diagnostic mammogram and ultrasound showed a 1.0 cm mass at the 3 clock position of left breast, and the enlarged left axillary node with cortical thickening. The 8mm cm oval cyst in the right breast is likely benign.   09/06/2016 Miscellaneous   Mammaprint showed low risk luminal A type, with average 10 year risk of recurrence untreated 10%.   09/16/2016 Genetic Testing   Genetic testing was normal, and did not reveal a deleterious mutation  One variant of uncertain significance (VUS) was found in the BRIP1 gene.  Genes tested include: ATM, BARD1, BRCA1, BRCA2, BRIP1, CDH1, CHEK2, FANCC, MLH1, MSH2, MSH6, NBN, PALB2, PMS2, PTEN, RAD51C, RAD51D, TP53, and XRCC2.  This panel also includes  deletion/duplication analysis (without sequencing) for one gene, EPCAM.     10/05/2016 Surgery   Left breast lumpectomy and SLN biopsy    10/05/2016 Pathology Results   Left breast lumpectomy showed invasive and in situ ductal carcinoma, 1 cm, grade 1, margins are negative, 2 sentinel lymph nodes are positive for metastatic carcinoma with extracapsular extension. LVI (+)   11/08/2016 - 12/24/2016 Radiation Therapy   Adjuvant radiation (Dr. Reggie. The Left breast was treated to 50.4 Gy in 28 fractions of 1.8 Gy per fraction. 2. The Left axilla was treated to 50.4 Gy in 28 fractions of 1.8 Gy per fraction.  3. The Left breast was boosted to 10 Gy in 5 fractions of 2 Gy per fraction.     01/10/2017 -  Anti-estrogen oral therapy   Exemestane  25 mg Daily started 01/10/17, pt stopped in mid March 2019 due to insurance issue. She restarted Exemestane  in 2019.    04/14/2017 Surgery   LAPAROSCOPIC BILATERAL SALPINGO OOPHORECTOMY by Dr. Kandyce   09/27/2017 Imaging    US  Abd 09/27/17  IMPRESSION: 1. Small echogenic focus in the anterior right lobe of liver with diminished echogenicity peripherally. Possible atypical hemangioma but a metastatic deposit cannot be excluded. Consider CT or MRI of the abdomen to assess further. 2. No gallstones. 3. Portions of the head and tail of the pancreas are obscured by bowel gas.   10/14/2017 Imaging   MRI Abd W WO Contrast 10/14/17  IMPRESSION: 1. 8 by 4 mm lesion with signal and enhancement characteristics most typical for a small hemangioma in segment IVb of the liver. No  other candidate lesion to correspond with the sonographic finding. No MRI findings of suspicion for hepatic metastatic disease. 2. Small left adrenal adenoma. 3. Scattered descending colon diverticula.      CURRENT THERAPY: Exemestane , starting 12/2016, held ~11/20019 - 08/2019   INTERVAL HISTORY Ms. Miggins returns for follow up as scheduled. Last seen by me 12/30/22.  She feels  wonderful and continues to make positive changes in her life to support her emotional and physical wellbeing.  She weaned off Wellbutrin  and Paxil , diagnosed with ADHD and started Adderall which has been very helpful.  She ran out of exemestane  a month ago.  Denies side effects or any new breast concerns such as lump/mass, nipple discharge or inversion, or skin change.  ROS  Other systems reviewed and negative  Past Medical History:  Diagnosis Date   Allergy    Anxiety    Arthritis    Back pain    Bilateral swelling of feet and ankles    Constipation    Depression    Headache    ocular migraines - very rare   Heartburn    Joint pain    Kidney infection    Malignant neoplasm of upper-outer quadrant of left breast in female, estrogen receptor negative (HCC) 09/10/2016   pt states that cancer is on left breast, not right   Pre-diabetes    SOB (shortness of breath)    Urinary frequency    Vitamin D  deficiency      Past Surgical History:  Procedure Laterality Date   BREAST LUMPECTOMY     BREAST LUMPECTOMY WITH RADIOACTIVE SEED AND SENTINEL LYMPH NODE BIOPSY Left 10/05/2016   Procedure: LEFT BREAST RADIOACTIVE SEED GUIDED LUMPECTOMY, LEFT AXILLARY RADIOACTIVE SEED GUIDED NODE EXCISION, LEFT AXILLARY SENTINEL LYMPH NODE BIOPSY(LEFT TARGETED AXILLARY DISSECTION);  Surgeon: Donnice Bury, MD;  Location: [facility] OR;  Service: General;  Laterality: Left;   DENTAL SURGERY     DILATION AND CURETTAGE OF UTERUS     LAPAROSCOPIC BILATERAL SALPINGO OOPHERECTOMY Bilateral 04/14/2017   Procedure: LAPAROSCOPIC BILATERAL SALPINGO OOPHORECTOMY;  Surgeon: Kandyce Sor, MD;  Location: [facility] ORS;  Service: Gynecology;  Laterality: Bilateral;   right knee surgery at age of 57        Outpatient Encounter Medications as of 12/29/2023  Medication Sig   amphetamine-dextroamphetamine (ADDERALL XR) 30 MG 24 hr capsule Take 30 mg by mouth daily.   aspirin EC 81 MG tablet Take 81 mg by mouth daily. Swallow  whole.   Ipratropium-Albuterol (COMBIVENT  RESPIMAT) 20-100 MCG/ACT AERS respimat Inhale 1 puff into the lungs every 6 (six) hours as needed for wheezing.   meloxicam  (MOBIC ) 15 MG tablet Daily for 10 days and the prn for 30 days.   traZODone (DESYREL) 100 MG tablet Take 100 mg by mouth at bedtime as needed.   [DISCONTINUED] exemestane  (AROMASIN ) 25 MG tablet Take 1 tablet by mouth daily after breakfast.   No facility-administered encounter medications on file as of 12/29/2023.     Today's Vitals   12/29/23 0851 12/29/23 0905  BP: (!) 114/59   Pulse: 89   Resp: 16   Temp: (!) 97.5 F (36.4 C)   TempSrc: Temporal   SpO2: 97%   Weight: 170 lb 3 oz (77.2 kg)   PainSc:  0-No pain   Body mass index is 29.21 kg/m.   PHYSICAL EXAM GENERAL:alert, no distress and comfortable SKIN: no rash  EYES: sclera clear NECK: without mass LYMPH:  no palpable cervical or supraclavicular lymphadenopathy  LUNGS: normal  breathing effort HEART:  no lower extremity edema ABDOMEN: abdomen soft, non-tender and normal bowel sounds NEURO: alert & oriented x 3 with fluent speech, no focal motor/sensory deficits Breast exam: No nipple discharge or inversion.  S/p left lumpectomy, incisions completely healed.  No palpable mass or nodularity in either breast or axilla that I could appreciate.   CBC    Component Value Date/Time   WBC 8.4 12/29/2023 0835   WBC 9.2 12/08/2021 1038   RBC 4.95 12/29/2023 0835   HGB 13.9 12/29/2023 0835   HGB 14.5 11/07/2023 0822   HGB 14.0 11/24/2017 0900   HCT 42.7 12/29/2023 0835   HCT 44.9 11/07/2023 0822   HCT 42.9 11/24/2017 0900   PLT 447 (H) 12/29/2023 0835   PLT 503 (H) 11/07/2023 0822   MCV 86.3 12/29/2023 0835   MCV 87 11/07/2023 0822   MCV 86.6 11/24/2017 0900   MCH 28.1 12/29/2023 0835   MCHC 32.6 12/29/2023 0835   RDW 12.9 12/29/2023 0835   RDW 11.9 11/07/2023 0822   RDW 13.7 11/24/2017 0900   LYMPHSABS 2.3 12/29/2023 0835   LYMPHSABS 1.6 11/24/2017  0900   MONOABS 0.7 12/29/2023 0835   MONOABS 0.6 11/24/2017 0900   EOSABS 0.2 12/29/2023 0835   EOSABS 0.2 11/24/2017 0900   BASOSABS 0.1 12/29/2023 0835   BASOSABS 0.1 11/24/2017 0900     CMP     Component Value Date/Time   NA 141 12/29/2023 0835   NA 142 11/07/2023 0822   NA 141 11/24/2017 0900   K 4.5 12/29/2023 0835   K 4.2 11/24/2017 0900   CL 105 12/29/2023 0835   CO2 30 12/29/2023 0835   CO2 27 11/24/2017 0900   GLUCOSE 121 (H) 12/29/2023 0835   GLUCOSE 82 11/24/2017 0900   BUN 18 12/29/2023 0835   BUN 15 11/07/2023 0822   BUN 14.7 11/24/2017 0900   CREATININE 0.90 12/29/2023 0835   CREATININE 0.8 11/24/2017 0900   CALCIUM 9.4 12/29/2023 0835   CALCIUM 9.4 11/24/2017 0900   PROT 7.2 12/29/2023 0835   PROT 6.7 11/07/2023 0822   PROT 7.0 11/24/2017 0900   ALBUMIN 4.1 12/29/2023 0835   ALBUMIN 4.2 11/07/2023 0822   ALBUMIN 3.5 11/24/2017 0900   AST 15 12/29/2023 0835   AST 15 11/24/2017 0900   ALT 21 12/29/2023 0835   ALT 31 11/24/2017 0900   ALKPHOS 148 (H) 12/29/2023 0835   ALKPHOS 163 (H) 11/24/2017 0900   BILITOT 0.4 12/29/2023 0835   BILITOT 0.22 11/24/2017 0900   GFRNONAA >60 12/29/2023 0835   GFRAA >60 08/20/2020 0740     ASSESSMENT & PLAN: 53 y.o. woman, presented with screening discovered left breast mass and axilla adenopathy.   Breast cancer of upper-outer quadrant of left female breast, pT1bN1aM0 stage IIA, G1, ER+/PR+/HER2-, Ki67 5%; mammaprint low risk luminal type A -diagnosed in 08/2016, s/p lumpectomy and adjuvant radiation. MammaPrint showed low risk luminal type A, adjuvant chemo was not recommended -She began anti estrogen 12/2016 with ovarian suppression, s/p BSO 03/2017. completed 7 years this month.  -Ms. Dejonge is clinically doing very well. She tolerated AI and has completed therapy. Breast exam is benign, labs are stable, recent mammo is negative. Overall no clinical concern for recurrence.  -I offered to continue annual surveillance  with us , she would like to be discharged back to PCP which is fine as long as she gets a physical breast exam and mammogram annually. Consider new contrast-enhanced mammogram for her density cat C. -  F/up with me as needed   Thrombocytosis, polycythemia -She has had stable thrombocytosis 400-480 since 2017, no previous work-up.  Denies history of chronic infection, splenomegaly -MPN panel including JAK2 mutation was negative, EPO level was normal -Possibly reactive/secondary to smoking history, arthritis/inflammation.  -She was previously referred for sleep study to rule out apnea as a secondary cause -Hgb normal since 2021 -stable, hgb remains normal and plt 400 - 500. She takes baby aspirin    Depression, ADHD -she presented on 08/27/19 with tearfulness, lacking motivation, weight gain, poor sleep, negative body and intimacy issues. She admits to having depression. Denied SI/HI -she was previously on Paxil  for hot flashes which helped vasomotor symptoms.  She restarted on 08/27/2019 with significant improvement in her depressed mood and other symptoms.   -Weight loss provider recently started Wellbutrin  and is awaiting ADHD workup -now off wellbutrin  and paxil  since diagnosed with ADHD, improved on adderall   Genetics -Negative for known pathogenic mutations within any of 20 genes on the Breast/Ovarian Cancer Panel through [employer].  One VUS [id number] (p.[id number]) was found in one copy of the BRIP1 gene.     Bone Health -Normal DEXA in 08/2017, 06/2020 and 07/29/2022 -continue calcium, vitamin D , and weight bearing exercise -q2 years   Health maintenance -colonoscopy 2021, 6 polyps removed, repeat 10/2023 was benign (Cirigliano), no samples were taken. 5 year recall -S/p BSO; Paps per OB/GYN -She is working hard at [employer], intentional weight loss, positive life changes      PLAN: -Recent mammogram and today's labs reviewed -Completed anti-estrogen  therapy -Surveillance with annual breast exam and mammo in December per PCP -Consider contrast-enhanced mammograms for density cat C -F/up open, as needed in the future    All questions were answered. The patient knows to call the clinic with any problems, questions or concerns. No barriers to learning were detected. I spent 20 minutes counseling the patient face to face. The total time spent in the appointment was 30 minutes and more than 50% was on counseling, review of test results, and coordination of care.   Tiyon Sanor, NP-C 12/29/2023

## 2024-01-17 ENCOUNTER — Ambulatory Visit: Payer: BC Managed Care – PPO | Admitting: Family Medicine

## 2024-04-09 DIAGNOSIS — F902 Attention-deficit hyperactivity disorder, combined type: Secondary | ICD-10-CM | POA: Diagnosis not present

## 2024-04-17 DIAGNOSIS — F902 Attention-deficit hyperactivity disorder, combined type: Secondary | ICD-10-CM | POA: Diagnosis not present

## 2024-04-24 DIAGNOSIS — F902 Attention-deficit hyperactivity disorder, combined type: Secondary | ICD-10-CM | POA: Diagnosis not present

## 2024-05-03 DIAGNOSIS — F902 Attention-deficit hyperactivity disorder, combined type: Secondary | ICD-10-CM | POA: Diagnosis not present

## 2024-05-09 DIAGNOSIS — F902 Attention-deficit hyperactivity disorder, combined type: Secondary | ICD-10-CM | POA: Diagnosis not present

## 2024-05-09 DIAGNOSIS — F331 Major depressive disorder, recurrent, moderate: Secondary | ICD-10-CM | POA: Diagnosis not present

## 2024-05-23 DIAGNOSIS — F902 Attention-deficit hyperactivity disorder, combined type: Secondary | ICD-10-CM | POA: Diagnosis not present

## 2024-05-24 DIAGNOSIS — F902 Attention-deficit hyperactivity disorder, combined type: Secondary | ICD-10-CM | POA: Diagnosis not present

## 2024-05-30 DIAGNOSIS — F902 Attention-deficit hyperactivity disorder, combined type: Secondary | ICD-10-CM | POA: Diagnosis not present

## 2024-06-26 DIAGNOSIS — F902 Attention-deficit hyperactivity disorder, combined type: Secondary | ICD-10-CM | POA: Diagnosis not present

## 2024-07-11 DIAGNOSIS — F902 Attention-deficit hyperactivity disorder, combined type: Secondary | ICD-10-CM | POA: Diagnosis not present

## 2024-07-18 DIAGNOSIS — F331 Major depressive disorder, recurrent, moderate: Secondary | ICD-10-CM | POA: Diagnosis not present

## 2024-07-18 DIAGNOSIS — F902 Attention-deficit hyperactivity disorder, combined type: Secondary | ICD-10-CM | POA: Diagnosis not present

## 2024-08-29 ENCOUNTER — Encounter: Payer: Self-pay | Admitting: Hematology

## 2024-08-31 ENCOUNTER — Ambulatory Visit (INDEPENDENT_AMBULATORY_CARE_PROVIDER_SITE_OTHER): Admitting: Family Medicine

## 2024-08-31 VITALS — BP 128/80 | HR 74 | Temp 98.1°F | Resp 16 | Ht 63.82 in | Wt 176.2 lb

## 2024-08-31 DIAGNOSIS — Z0000 Encounter for general adult medical examination without abnormal findings: Secondary | ICD-10-CM | POA: Diagnosis not present

## 2024-08-31 DIAGNOSIS — R748 Abnormal levels of other serum enzymes: Secondary | ICD-10-CM

## 2024-08-31 DIAGNOSIS — Z23 Encounter for immunization: Secondary | ICD-10-CM

## 2024-08-31 DIAGNOSIS — E559 Vitamin D deficiency, unspecified: Secondary | ICD-10-CM | POA: Diagnosis not present

## 2024-08-31 DIAGNOSIS — E785 Hyperlipidemia, unspecified: Secondary | ICD-10-CM

## 2024-08-31 DIAGNOSIS — Z130 Encounter for screening for diseases of the blood and blood-forming organs and certain disorders involving the immune mechanism: Secondary | ICD-10-CM

## 2024-08-31 DIAGNOSIS — Z1329 Encounter for screening for other suspected endocrine disorder: Secondary | ICD-10-CM

## 2024-08-31 DIAGNOSIS — Z13228 Encounter for screening for other metabolic disorders: Secondary | ICD-10-CM | POA: Diagnosis not present

## 2024-08-31 LAB — LIPID PANEL
Cholesterol: 232 mg/dL — ABNORMAL HIGH (ref 0–200)
HDL: 72.1 mg/dL (ref >39.00)
LDL Cholesterol: 141 mg/dL — ABNORMAL HIGH (ref 0–99)
NonHDL: 159.76
Total CHOL/HDL Ratio: 3
Triglycerides: 92 mg/dL (ref 0.0–149.0)
VLDL: 18.4 mg/dL (ref 0.0–40.0)

## 2024-08-31 LAB — BASIC METABOLIC PANEL WITH GFR
BUN: 12 mg/dL (ref 6–23)
CO2: 30 meq/L (ref 19–32)
Calcium: 9.6 mg/dL (ref 8.4–10.5)
Chloride: 103 meq/L (ref 96–112)
Creatinine, Ser: 0.7 mg/dL (ref 0.40–1.20)
GFR: 97.98 mL/min (ref >60.00)
Glucose, Bld: 89 mg/dL (ref 70–99)
Potassium: 4.7 meq/L (ref 3.5–5.1)
Sodium: 141 meq/L (ref 135–145)

## 2024-08-31 LAB — HEPATIC FUNCTION PANEL
ALT: 79 U/L — ABNORMAL HIGH (ref 0–35)
AST: 36 U/L (ref 0–37)
Albumin: 4.4 g/dL (ref 3.5–5.2)
Alkaline Phosphatase: 134 U/L — ABNORMAL HIGH (ref 39–117)
Bilirubin, Direct: 0.1 mg/dL (ref 0.0–0.3)
Total Bilirubin: 0.4 mg/dL (ref 0.2–1.2)
Total Protein: 6.9 g/dL (ref 6.0–8.3)

## 2024-08-31 LAB — VITAMIN D 25 HYDROXY (VIT D DEFICIENCY, FRACTURES): VITD: 20.22 ng/mL — ABNORMAL LOW (ref 30.00–100.00)

## 2024-08-31 LAB — HEMOGLOBIN A1C: Hgb A1c MFr Bld: 5.6 % (ref 4.6–6.5)

## 2024-08-31 NOTE — Assessment & Plan Note (Addendum)
 We discussed the importance of regular physical activity and healthy diet for prevention of chronic illness and/or complications. Preventive guidelines reviewed. Vaccination updated today, today she received Prevnar 20 and flu vaccine.  She is not sure about hepatitis B vaccine, will add hep B antibodies to blood work today. Ca++ and vit D supplementation to continue. Continue female preventive care with gynecologist, Pap smear and mammogram up-to-date. Next CPE in a year.

## 2024-08-31 NOTE — Assessment & Plan Note (Signed)
 Continue nonpharmacologic treatment. Further recommendation will be given according to lipid panel result.

## 2024-08-31 NOTE — Patient Instructions (Addendum)
 A few things to remember from today's visit:  Routine general medical examination at a health care facility  Encounter for annual physical exam  Influenza vaccine needed - Plan: Flu vaccine trivalent PF, 6mos and older(Flulaval,Afluria,Fluarix,Fluzone)  Hyperlipidemia, unspecified hyperlipidemia type - Plan: Lipid panel  Screening for endocrine, metabolic and immunity disorder - Plan: Basic metabolic panel with GFR, Hemoglobin A1c  Vitamin D  deficiency, unspecified - Plan: VITAMIN D  25 Hydroxy (Vit-D Deficiency, Fractures)  Elevated alkaline phosphatase level - Plan: Hepatic function panel, Hepatitis B surface antigen, Hepatitis B surface antibody,qualitative   Do not use My Chart to request refills or for acute issues that need immediate attention. If you send a my chart message, it may take a few days to be addressed, specially if I am not in the office.  Please be sure medication list is accurate. If a new problem present, please set up appointment sooner than planned today.  Health Maintenance, Female Adopting a healthy lifestyle and getting preventive care are important in promoting health and wellness. Ask your health care provider about: The right schedule for you to have regular tests and exams. Things you can do on your own to prevent diseases and keep yourself healthy. What should I know about diet, weight, and exercise? Eat a healthy diet  Eat a diet that includes plenty of vegetables, fruits, low-fat dairy products, and lean protein. Do not eat a lot of foods that are high in solid fats, added sugars, or sodium. Maintain a healthy weight Body mass index (BMI) is used to identify weight problems. It estimates body fat based on height and weight. Your health care provider can help determine your BMI and help you achieve or maintain a healthy weight. Get regular exercise Get regular exercise. This is one of the most important things you can do for your health. Most adults  should: Exercise for at least 150 minutes each week. The exercise should increase your heart rate and make you sweat (moderate-intensity exercise). Do strengthening exercises at least twice a week. This is in addition to the moderate-intensity exercise. Spend less time sitting. Even light physical activity can be beneficial. Watch cholesterol and blood lipids Have your blood tested for lipids and cholesterol at 54 years of age, then have this test every 5 years. Have your cholesterol levels checked more often if: Your lipid or cholesterol levels are high. You are older than 54 years of age. You are at high risk for heart disease. What should I know about cancer screening? Depending on your health history and family history, you may need to have cancer screening at various ages. This may include screening for: Breast cancer. Cervical cancer. Colorectal cancer. Skin cancer. Lung cancer. What should I know about heart disease, diabetes, and high blood pressure? Blood pressure and heart disease High blood pressure causes heart disease and increases the risk of stroke. This is more likely to develop in people who have high blood pressure readings or are overweight. Have your blood pressure checked: Every 3-5 years if you are 4-74 years of age. Every year if you are 69 years old or older. Diabetes Have regular diabetes screenings. This checks your fasting blood sugar level. Have the screening done: Once every three years after age 1 if you are at a normal weight and have a low risk for diabetes. More often and at a younger age if you are overweight or have a high risk for diabetes. What should I know about preventing infection? Hepatitis B If you  have a higher risk for hepatitis B, you should be screened for this virus. Talk with your health care provider to find out if you are at risk for hepatitis B infection. Hepatitis C Testing is recommended for: Everyone born from 18 through  1965. Anyone with known risk factors for hepatitis C. Sexually transmitted infections (STIs) Get screened for STIs, including gonorrhea and chlamydia, if: You are sexually active and are younger than 54 years of age. You are older than 54 years of age and your health care provider tells you that you are at risk for this type of infection. Your sexual activity has changed since you were last screened, and you are at increased risk for chlamydia or gonorrhea. Ask your health care provider if you are at risk. Ask your health care provider about whether you are at high risk for HIV. Your health care provider may recommend a prescription medicine to help prevent HIV infection. If you choose to take medicine to prevent HIV, you should first get tested for HIV. You should then be tested every 3 months for as long as you are taking the medicine. Pregnancy If you are about to stop having your period (premenopausal) and you may become pregnant, seek counseling before you get pregnant. Take 400 to 800 micrograms (mcg) of folic acid every day if you become pregnant. Ask for birth control (contraception) if you want to prevent pregnancy. Osteoporosis and menopause Osteoporosis is a disease in which the bones lose minerals and strength with aging. This can result in bone fractures. If you are 24 years old or older, or if you are at risk for osteoporosis and fractures, ask your health care provider if you should: Be screened for bone loss. Take a calcium or vitamin D  supplement to lower your risk of fractures. Be given hormone replacement therapy (HRT) to treat symptoms of menopause. Follow these instructions at home: Alcohol use Do not drink alcohol if: Your health care provider tells you not to drink. You are pregnant, may be pregnant, or are planning to become pregnant. If you drink alcohol: Limit how much you have to: 0-1 drink a day. Know how much alcohol is in your drink. In the [place] one drink  equals one 12 oz bottle of beer (355 mL), one 5 oz glass of wine (148 mL), or one 1 oz glass of hard liquor (44 mL). Lifestyle Do not use any products that contain nicotine or tobacco. These products include cigarettes, chewing tobacco, and vaping devices, such as e-cigarettes. If you need help quitting, ask your health care provider. Do not use street drugs. Do not share needles. Ask your health care provider for help if you need support or information about quitting drugs. General instructions Schedule regular health, dental, and eye exams. Stay current with your vaccines. Tell your health care provider if: You often feel depressed. You have ever been abused or do not feel safe at home. Summary Adopting a healthy lifestyle and getting preventive care are important in promoting health and wellness. Follow your health care provider's instructions about healthy diet, exercising, and getting tested or screened for diseases. Follow your health care provider's instructions on monitoring your cholesterol and blood pressure. This information is not intended to replace advice given to you by your health care provider. Make sure you discuss any questions you have with your health care provider. Document Revised: 03/30/2021 Document Reviewed: 03/30/2021 Elsevier Patient Education  2024 [employer].

## 2024-08-31 NOTE — Assessment & Plan Note (Signed)
 Currently she is on a daily multivitamin for women. Further recommendation will be given according to 25 OH vitamin D  result.

## 2024-08-31 NOTE — Progress Notes (Signed)
 Chief Complaint  Patient presents with   Annual Exam    HPI: Ms.Terease Rozanne Heumann is a 53 y.o. female a PMHx significant for left breast cancer, HLD, HTN, and depression here today for her routine physical.  Last CPE: 07/20/22.  She has not been exercising regularly. For the past 6 months she has been eating frozen meals, she has not had time to cook due to busy work schedule. Sleeping from 4-10 hours per night. She has not seen a dentist in years, she has an appointment already arranged. She sees an eye care provider regularly.  Immunization History  Administered Date(s) Administered   Influenza, Seasonal, Injecte, Preservative Fre 09/02/2023   Influenza,inj,Quad PF,6+ Mos 09/04/2019, 10/29/2021   Influenza-Unspecified 10/22/2021   PFIZER(Purple Top)SARS-COV-2 Vaccination 02/12/2020, 03/04/2020, 11/10/2020   Pfizer Covid-19 Vaccine Bivalent Booster 97yrs & up 10/29/2021   Tdap 11/23/2015   Zoster Recombinant(Shingrix ) 07/20/2022, 09/02/2023   Health Maintenance  Topic Date Due   Hepatitis B Vaccines 19-59 Average Risk (1 of 3 - 19+ 3-dose series) Never done   Pneumococcal Vaccine: 50+ Years (1 of 1 - PCV) Never done   Influenza Vaccine  06/22/2024   COVID-19 Vaccine (5 - 2025-26 season) 09/16/2024 (Originally 07/23/2024)   Mammogram  11/09/2024   DTaP/Tdap/Td (2 - Td or Tdap) 11/22/2025   Cervical Cancer Screening (HPV/Pap Cotest)  06/10/2026   Colonoscopy  11/10/2026   Hepatitis C Screening  Completed   HIV Screening  Completed   Zoster Vaccines- Shingrix   Completed   HPV VACCINES  Aged Out   Meningococcal B Vaccine  Aged Out   Hyperlipidemia: Currently she is not on pharmacologic treatment. Lab Results  Component Value Date   CHOL 192 11/07/2023   HDL 47 11/07/2023   LDLCALC 121 (H) 11/07/2023   TRIG 137 11/07/2023   CHOLHDL 4.1 11/07/2023   Lab Results  Component Value Date   HGBA1C 5.7 (H) 11/07/2023   She takes a daily women 50+  multivitamin.  Breast cancer, she  saw oncology on 12/29/2023 and no further recommended, completed 7.5 years of Exemestane . She also follows with podiatry regularly. ADHD and insomnia: She follows with psychiatrist. She is no longer seeing psychotherapist, she did not feel like it helped, she is looking for a new provider.  Review of Systems  Constitutional:  Negative for activity change, appetite change and fever.  HENT:  Negative for mouth sores, sore throat and trouble swallowing.   Eyes:  Negative for redness and visual disturbance.  Respiratory:  Negative for cough, shortness of breath and wheezing.   Cardiovascular:  Negative for chest pain and leg swelling.  Gastrointestinal:  Negative for abdominal pain, nausea and vomiting.  Endocrine: Negative for cold intolerance, heat intolerance, polydipsia, polyphagia and polyuria.  Genitourinary:  Negative for decreased urine volume, dysuria and hematuria.  Musculoskeletal:  Positive for arthralgias. Negative for gait problem and myalgias.  Skin:  Negative for color change and rash.  Allergic/Immunologic: Positive for environmental allergies.  Neurological:  Negative for syncope, weakness and headaches.  Hematological:  Negative for adenopathy. Does not bruise/bleed easily.  Psychiatric/Behavioral:  Negative for confusion and hallucinations.   All other systems reviewed and are negative.  Current Outpatient Medications on File Prior to Visit  Medication Sig Dispense Refill   amphetamine-dextroamphetamine (ADDERALL XR) 30 MG 24 hr capsule Take 30 mg by mouth daily.     aspirin EC 81 MG tablet Take 81 mg by mouth daily. Swallow whole.     Ipratropium-Albuterol (COMBIVENT   RESPIMAT) 20-100 MCG/ACT AERS respimat Inhale 1 puff into the lungs every 6 (six) hours as needed for wheezing. 4 g 2   traZODone (DESYREL) 100 MG tablet Take 100 mg by mouth at bedtime as needed.     No current facility-administered medications on file prior to visit.    Past Medical History:  Diagnosis Date   Allergy    Anxiety    Arthritis    Back pain    Bilateral swelling of feet and ankles    Constipation    Depression    Headache    ocular migraines - very rare   Heartburn    Joint pain    Kidney infection    Malignant neoplasm of upper-outer quadrant of left breast in female, estrogen receptor negative (HCC) 09/10/2016   pt states that cancer is on left breast, not right   Pre-diabetes    SOB (shortness of breath)    Urinary frequency    Vitamin D  deficiency     Past Surgical History:  Procedure Laterality Date   BREAST LUMPECTOMY     BREAST LUMPECTOMY WITH RADIOACTIVE SEED AND SENTINEL LYMPH NODE BIOPSY Left 10/05/2016   Procedure: LEFT BREAST RADIOACTIVE SEED GUIDED LUMPECTOMY, LEFT AXILLARY RADIOACTIVE SEED GUIDED NODE EXCISION, LEFT AXILLARY SENTINEL LYMPH NODE BIOPSY(LEFT TARGETED AXILLARY DISSECTION);  Surgeon: Donnice Bury, MD;  Location: [facility] OR;  Service: General;  Laterality: Left;   DENTAL SURGERY     DILATION AND CURETTAGE OF UTERUS     LAPAROSCOPIC BILATERAL SALPINGO OOPHERECTOMY Bilateral 04/14/2017   Procedure: LAPAROSCOPIC BILATERAL SALPINGO OOPHORECTOMY;  Surgeon: Kandyce Sor, MD;  Location: [facility] ORS;  Service: Gynecology;  Laterality: Bilateral;   right knee surgery at age of 86       No Known Allergies  Family History  Problem Relation Age of Onset   Thyroid disease Mother    Other Mother        hx precancerous skin finding removed from eyelid at 52-71   Arthritis Mother    Hypertension Father    Breast cancer Sister    Heart attack Maternal Grandmother 50   Early death Maternal Grandmother    Heart disease Maternal Grandmother    Stroke Maternal Grandfather    Cancer Paternal Grandmother    Esophageal cancer Paternal Grandmother    Heart attack Paternal Grandfather 18   Early death Paternal Grandfather    Breast cancer Cousin        paternal 1st cousin, once-removed; dx. 24s   Other Other 25        hx of precancerous finding on abnormal pap smear   Cancer Other        maternal great aunt (MGM's sister) d. early 69s; spinal cancer, but this may have been a secondary cancer w/ NOS primary   Cancer Other        maternal great grandmother (MGM's mother) d. 10s w/ NOS cancer   Cancer Other        paternal great uncle (PGM's brother) d. 17s w/ NOS cancer   Colon cancer Neg Hx    Rectal cancer Neg Hx    Stomach cancer Neg Hx     Social History   Socioeconomic History   Marital status: Married    Spouse name: Not on file   Number of children: 1   Years of education: Not on file   Highest education level: Bachelor's degree (e.g., BA, AB, BS)  Occupational History   Not on file  Tobacco Use  Smoking status: Former    Current packs/day: 0.00    Average packs/day: 0.5 packs/day for 30.0 years (15.0 ttl pk-yrs)    Types: Cigarettes    Start date: 08/22/1986    Quit date: 08/22/2016    Years since quitting: 8.0   Smokeless tobacco: Never  Vaping Use   Vaping status: Never Used  Substance and Sexual Activity   Alcohol use: Yes    Alcohol/week: 1.0 standard drink of alcohol    Types: 1 Glasses of wine per week    Comment: 6 drinks wine or beer a week    Drug use: No   Sexual activity: Yes    Birth control/protection: Surgical  Other Topics Concern   Not on file  Social History Narrative   Not on file   Social Drivers of Health   Financial Resource Strain: Low Risk  (08/31/2024)   Overall Financial Resource Strain (CARDIA)    Difficulty of Paying Living Expenses: Not hard at all  Food Insecurity: No Food Insecurity (08/31/2024)   Hunger Vital Sign    Worried About Running Out of Food in the Last Year: Never true    Ran Out of Food in the Last Year: Never true  Transportation Needs: No Transportation Needs (08/31/2024)   PRAPARE - Administrator, Civil Service (Medical): No    Lack of Transportation (Non-Medical): No  Physical Activity: Insufficiently  Active (08/31/2024)   Exercise Vital Sign    Days of Exercise per Week: 2 days    Minutes of Exercise per Session: 20 min  Stress: Stress Concern Present (08/31/2024)   [employer] of Occupational Health - Occupational Stress Questionnaire    Feeling of Stress: Very much  Social Connections: Socially Isolated (08/31/2024)   Social Connection and Isolation Panel    Frequency of Communication with Friends and Family: Twice a week    Frequency of Social Gatherings with Friends and Family: Once a week    Attends Religious Services: Never    Database administrator or Organizations: No    Attends Engineer, structural: Not on file    Marital Status: Separated   Vitals:   08/31/24 1403  BP: 128/80  Pulse: 74  Resp: 16  Temp: 98.1 F (36.7 C)  SpO2: 98%   Body mass index is 30.42 kg/m.  Wt Readings from Last 3 Encounters:  08/31/24 176 lb 3.2 oz (79.9 kg)  12/29/23 170 lb 3 oz (77.2 kg)  12/14/23 166 lb (75.3 kg)   Physical Exam Vitals and nursing note reviewed.  Constitutional:      General: She is not in acute distress.    Appearance: She is well-developed.  HENT:     Head: Normocephalic and atraumatic.     Right Ear: Hearing, tympanic membrane, ear canal and external ear normal.     Left Ear: Hearing, tympanic membrane, ear canal and external ear normal.     Mouth/Throat:     Mouth: Mucous membranes are moist.     Pharynx: Oropharynx is clear. Uvula midline.  Eyes:     Extraocular Movements: Extraocular movements intact.     Conjunctiva/sclera: Conjunctivae normal.     Pupils: Pupils are equal, round, and reactive to light.  Neck:     Thyroid: No thyromegaly.     Trachea: No tracheal deviation.  Cardiovascular:     Rate and Rhythm: Normal rate and regular rhythm.     Pulses:  Dorsalis pedis pulses are 2+ on the right side and 2+ on the left side.     Heart sounds: No murmur heard. Pulmonary:     Effort: Pulmonary effort is normal. No  respiratory distress.     Breath sounds: Normal breath sounds.  Abdominal:     Palpations: Abdomen is soft. There is no hepatomegaly or mass.     Tenderness: There is no abdominal tenderness.  Genitourinary:    Comments: Deferred to gyn. Musculoskeletal:     Comments: No major deformity or signs of synovitis appreciated.  Lymphadenopathy:     Cervical: No cervical adenopathy.     Upper Body:     Right upper body: No supraclavicular adenopathy.     Left upper body: No supraclavicular adenopathy.  Skin:    General: Skin is warm.     Findings: No erythema or rash.  Neurological:     General: No focal deficit present.     Mental Status: She is alert and oriented to person, place, and time.     Cranial Nerves: No cranial nerve deficit.     Coordination: Coordination normal.     Gait: Gait normal.     Deep Tendon Reflexes:     Reflex Scores:      Bicep reflexes are 2+ on the right side and 2+ on the left side.      Patellar reflexes are 2+ on the right side and 2+ on the left side. Psychiatric:        Mood and Affect: Mood and affect normal.    ASSESSMENT AND PLAN: Ms. Shaunessy Dobratz was here today annual physical examination.  Orders Placed This Encounter  Procedures   Flu vaccine trivalent PF, 6mos and older(Flulaval,Afluria,Fluarix,Fluzone)   Basic metabolic panel with GFR   Hemoglobin A1c   Lipid panel   Hepatic function panel   Hepatitis B surface antigen   Hepatitis B surface antibody,qualitative   VITAMIN D  25 Hydroxy (Vit-D Deficiency, Fractures)    Lab Results  Component Value Date   NA 141 08/31/2024   CL 103 08/31/2024   K 4.7 08/31/2024   CO2 30 08/31/2024   BUN 12 08/31/2024   CREATININE 0.70 08/31/2024   GFR 97.98 08/31/2024   CALCIUM 9.6 08/31/2024   ALBUMIN 4.4 08/31/2024   GLUCOSE 89 08/31/2024   Lab Results  Component Value Date   HGBA1C 5.6 08/31/2024   Lab Results  Component Value Date   CHOL 232 (H) 08/31/2024   HDL 72.10  08/31/2024   LDLCALC 141 (H) 08/31/2024   TRIG 92.0 08/31/2024   CHOLHDL 3 08/31/2024   Lab Results  Component Value Date   VD25OH 20.22 (L) 08/31/2024   Routine general medical examination at a health care facility Assessment & Plan: We discussed the importance of regular physical activity and healthy diet for prevention of chronic illness and/or complications. Preventive guidelines reviewed. Vaccination updated today, today she received Prevnar 20 and flu vaccine.  She is not sure about hepatitis B vaccine, will add hep B antibodies to blood work today. Ca++ and vit D supplementation to continue. Continue female preventive care with gynecologist, Pap smear and mammogram up-to-date. Next CPE in a year.   Hyperlipidemia, unspecified hyperlipidemia type Assessment & Plan: Continue nonpharmacologic treatment. Further recommendation will be given according to lipid panel result.  Orders: -     Lipid panel; Future  Screening for endocrine, metabolic and immunity disorder -     Basic metabolic panel with GFR; Future -  Hemoglobin A1c; Future  Vitamin D  deficiency, unspecified Assessment & Plan: Currently she is on a daily multivitamin for women. Further recommendation will be given according to 25 OH vitamin D  result.  Orders: -     VITAMIN D  25 Hydroxy (Vit-D Deficiency, Fractures); Future  Elevated alkaline phosphatase level Assessment & Plan: Problem has been going on for years, around 09/2017, and otherwise stable. DEXA in 07/2022 and abdominal MRI in 09/2017 (1.8 x 4 mm hemangioma). Will continue annual follow up as far as it is stable.  Orders: -     Hepatic function panel; Future -     Hepatitis B surface antigen; Future -     Hepatitis B surface antibody,qualitative; Future  Need for pneumococcal vaccine -     Pneumococcal conjugate vaccine 20-valent  Influenza vaccine needed -     Flu vaccine trivalent PF, 6mos and  older(Flulaval,Afluria,Fluarix,Fluzone)   Return in 1 year (on 08/31/2025) for CPE, chronic problems.  Kalayla Shadden G. [place], MD  [facility]. [place] office.

## 2024-09-01 ENCOUNTER — Ambulatory Visit: Payer: Self-pay | Admitting: Family Medicine

## 2024-09-01 DIAGNOSIS — R7989 Other specified abnormal findings of blood chemistry: Secondary | ICD-10-CM

## 2024-09-01 LAB — HEPATITIS B SURFACE ANTIBODY,QUALITATIVE: Hep B S Ab: NONREACTIVE

## 2024-09-01 LAB — HEPATITIS B SURFACE ANTIGEN: Hepatitis B Surface Ag: NONREACTIVE

## 2024-09-01 NOTE — Assessment & Plan Note (Signed)
 Problem has been going on for years, around 09/2017, and otherwise stable. DEXA in 07/2022 and abdominal MRI in 09/2017 (1.8 x 4 mm hemangioma). Will continue annual follow up as far as it is stable.

## 2024-09-03 NOTE — Telephone Encounter (Signed)
 Patient notified of results. Patient scheduled for lab visit and vaccinations. Patient voiced understanding.

## 2024-09-07 ENCOUNTER — Ambulatory Visit (HOSPITAL_BASED_OUTPATIENT_CLINIC_OR_DEPARTMENT_OTHER)

## 2024-10-02 ENCOUNTER — Telehealth: Admitting: Physician Assistant

## 2024-10-02 ENCOUNTER — Telehealth

## 2024-10-02 DIAGNOSIS — B9689 Other specified bacterial agents as the cause of diseases classified elsewhere: Secondary | ICD-10-CM

## 2024-10-02 DIAGNOSIS — J019 Acute sinusitis, unspecified: Secondary | ICD-10-CM

## 2024-10-02 MED ORDER — BENZONATATE 100 MG PO CAPS: 100.0000 mg | ORAL_CAPSULE | Freq: Three times a day (TID) | ORAL | 0 refills | Status: AC | PRN

## 2024-10-02 MED ORDER — IPRATROPIUM BROMIDE 0.03 % NA SOLN: 2.0000 | Freq: Two times a day (BID) | NASAL | 0 refills | Status: AC

## 2024-10-02 MED ORDER — AMOXICILLIN-POT CLAVULANATE 875-125 MG PO TABS: 1.0000 | ORAL_TABLET | Freq: Two times a day (BID) | ORAL | 0 refills | Status: AC

## 2024-10-02 NOTE — Progress Notes (Signed)
 Message sent to patient requesting further input regarding current symptoms. Awaiting patient response.

## 2024-10-02 NOTE — Progress Notes (Signed)
 E-Visit for Sinus Problems  We are sorry that you are not feeling well.  Here is how we plan to help!  Based on what you have shared with me it looks like you have sinusitis.  Sinusitis is inflammation and infection in the sinus cavities of the head.  Based on your presentation I believe you most likely have Acute Bacterial Sinusitis.  This is an infection caused by bacteria and is treated with antibiotics. I have prescribed Augmentin 875mg /125mg  one tablet twice daily with food, for 7 days. and I have also prescribed Ipratropium Bromide Nasal Spray Use 1 spray in each nostril twice daily as needed for drainage; discontinue if too drying. I have also prescribed Tessalon  to help further with cough. You may use an oral decongestant such as Mucinex D or if you have glaucoma or high blood pressure use plain Mucinex. Saline nasal spray help and can safely be used as often as needed for congestion.  If you develop worsening sinus pain, fever or notice severe headache and vision changes, or if symptoms are not better after completion of antibiotic, please schedule an appointment with a health care provider.    Sinus infections are not as easily transmitted as other respiratory infection, however we still recommend that you avoid close contact with loved ones, especially the very young and elderly.  Remember to wash your hands thoroughly throughout the day as this is the number one way to prevent the spread of infection!  Home Care: Only take medications as instructed by your medical team. Complete the entire course of an antibiotic. Do not take these medications with alcohol. A steam or ultrasonic humidifier can help congestion.  You can place a towel over your head and breathe in the steam from hot water coming from a faucet. Avoid close contacts especially the very young and the elderly. Cover your mouth when you cough or sneeze. Always remember to wash your hands.  Get Help Right Away If: You  develop worsening fever or sinus pain. You develop a severe head ache or visual changes. Your symptoms persist after you have completed your treatment plan.  Make sure you Understand these instructions. Will watch your condition. Will get help right away if you are not doing well or get worse.  Your e-visit answers were reviewed by a board certified advanced clinical practitioner to complete your personal care plan.  Depending on the condition, your plan could have included both over the counter or prescription medications.  If there is a problem please reply  once you have received a response from your provider.  Your safety is important to us .  If you have drug allergies check your prescription carefully.    You can use MyChart to ask questions about today's visit, request a non-urgent call back, or ask for a work or school excuse for 24 hours related to this e-Visit. If it has been greater than 24 hours you will need to follow up with your provider, or enter a new e-Visit to address those concerns.  You will get an e-mail in the next two days asking about your experience.  I hope that your e-visit has been valuable and will speed your recovery. Thank you for using e-visits.  I have spent 8 minutes in review of e-visit questionnaire, review and updating patient chart, medical decision making and response to patient.   Elsie Velma Lunger, PA-C

## 2024-12-04 ENCOUNTER — Other Ambulatory Visit

## 2024-12-04 ENCOUNTER — Ambulatory Visit
